# Patient Record
Sex: Male | Born: 1955 | Race: White | Hispanic: No | Smoking: Former smoker
Health system: Southern US, Community
[De-identification: ages and names within clinical notes are randomized; demographics above are authoritative.]

## PROBLEM LIST (undated history)

## (undated) DIAGNOSIS — K760 Fatty (change of) liver, not elsewhere classified: Secondary | ICD-10-CM

## (undated) DIAGNOSIS — M1990 Unspecified osteoarthritis, unspecified site: Secondary | ICD-10-CM

## (undated) DIAGNOSIS — Z860100 Personal history of colon polyps, unspecified: Secondary | ICD-10-CM

## (undated) DIAGNOSIS — I5189 Other ill-defined heart diseases: Secondary | ICD-10-CM

## (undated) DIAGNOSIS — G459 Transient cerebral ischemic attack, unspecified: Secondary | ICD-10-CM

## (undated) DIAGNOSIS — E7800 Pure hypercholesterolemia, unspecified: Secondary | ICD-10-CM

## (undated) DIAGNOSIS — I10 Essential (primary) hypertension: Secondary | ICD-10-CM

## (undated) DIAGNOSIS — F17210 Nicotine dependence, cigarettes, uncomplicated: Secondary | ICD-10-CM

## (undated) DIAGNOSIS — K624 Stenosis of anus and rectum: Secondary | ICD-10-CM

## (undated) DIAGNOSIS — I872 Venous insufficiency (chronic) (peripheral): Secondary | ICD-10-CM

## (undated) DIAGNOSIS — R4781 Slurred speech: Secondary | ICD-10-CM

## (undated) DIAGNOSIS — F419 Anxiety disorder, unspecified: Secondary | ICD-10-CM

## (undated) DIAGNOSIS — K5730 Diverticulosis of large intestine without perforation or abscess without bleeding: Secondary | ICD-10-CM

## (undated) DIAGNOSIS — E669 Obesity, unspecified: Secondary | ICD-10-CM

## (undated) HISTORY — PX: TONSILLECTOMY: SUR1361

## (undated) HISTORY — DX: Stenosis of anus and rectum: K62.4

## (undated) HISTORY — DX: Nicotine dependence, cigarettes, uncomplicated: F17.210

## (undated) HISTORY — DX: Venous insufficiency (chronic) (peripheral): I87.2

## (undated) HISTORY — DX: Obesity, unspecified: E66.9

## (undated) HISTORY — DX: Diverticulosis of large intestine without perforation or abscess without bleeding: K57.30

## (undated) HISTORY — DX: Pure hypercholesterolemia, unspecified: E78.00

## (undated) HISTORY — DX: Anxiety disorder, unspecified: F41.9

## (undated) HISTORY — PX: FLEXIBLE SIGMOIDOSCOPY: SHX1649

## (undated) HISTORY — DX: Unspecified osteoarthritis, unspecified site: M19.90

## (undated) HISTORY — DX: Personal history of colonic polyps: Z86.010

## (undated) HISTORY — DX: Fatty (change of) liver, not elsewhere classified: K76.0

## (undated) HISTORY — DX: Essential (primary) hypertension: I10

## (undated) HISTORY — DX: Personal history of colon polyps, unspecified: Z86.0100

---

## 2003-11-21 HISTORY — PX: KNEE ARTHROSCOPY: SHX127

## 2003-12-13 ENCOUNTER — Ambulatory Visit (HOSPITAL_COMMUNITY): Admission: RE | Admit: 2003-12-13 | Discharge: 2003-12-13 | Payer: Self-pay | Admitting: Orthopedic Surgery

## 2005-05-13 ENCOUNTER — Ambulatory Visit: Payer: Self-pay | Admitting: Pulmonary Disease

## 2006-06-16 ENCOUNTER — Ambulatory Visit: Payer: Self-pay | Admitting: Pulmonary Disease

## 2009-05-22 DIAGNOSIS — E7800 Pure hypercholesterolemia, unspecified: Secondary | ICD-10-CM | POA: Insufficient documentation

## 2009-05-23 ENCOUNTER — Ambulatory Visit: Payer: Self-pay | Admitting: Pulmonary Disease

## 2009-05-27 DIAGNOSIS — M1990 Unspecified osteoarthritis, unspecified site: Secondary | ICD-10-CM | POA: Insufficient documentation

## 2009-05-27 DIAGNOSIS — F17200 Nicotine dependence, unspecified, uncomplicated: Secondary | ICD-10-CM | POA: Insufficient documentation

## 2009-05-27 DIAGNOSIS — K649 Unspecified hemorrhoids: Secondary | ICD-10-CM | POA: Insufficient documentation

## 2009-05-27 DIAGNOSIS — K7689 Other specified diseases of liver: Secondary | ICD-10-CM | POA: Insufficient documentation

## 2009-05-27 DIAGNOSIS — K624 Stenosis of anus and rectum: Secondary | ICD-10-CM

## 2009-05-27 DIAGNOSIS — F411 Generalized anxiety disorder: Secondary | ICD-10-CM | POA: Insufficient documentation

## 2009-05-27 LAB — CONVERTED CEMR LAB
ALT: 20 units/L (ref 0–53)
AST: 21 units/L (ref 0–37)
Albumin: 4.7 g/dL (ref 3.5–5.2)
Alkaline Phosphatase: 78 units/L (ref 39–117)
BUN: 14 mg/dL (ref 6–23)
Basophils Absolute: 0.1 10*3/uL (ref 0.0–0.1)
Basophils Relative: 0.7 % (ref 0.0–3.0)
Bilirubin, Direct: 0.2 mg/dL (ref 0.0–0.3)
CO2: 29 meq/L (ref 19–32)
Calcium: 9.6 mg/dL (ref 8.4–10.5)
Chloride: 105 meq/L (ref 96–112)
Cholesterol: 213 mg/dL — ABNORMAL HIGH (ref 0–200)
Creatinine, Ser: 1 mg/dL (ref 0.4–1.5)
Direct LDL: 139.4 mg/dL
Eosinophils Absolute: 0.3 10*3/uL (ref 0.0–0.7)
Eosinophils Relative: 2.2 % (ref 0.0–5.0)
GFR calc non Af Amer: 83.66 mL/min (ref >60)
Glucose, Bld: 94 mg/dL (ref 70–99)
HCT: 47.4 % (ref 39.0–52.0)
HDL: 51 mg/dL (ref >39.00)
Hemoglobin, Urine: NEGATIVE
Hemoglobin: 16.2 g/dL (ref 13.0–17.0)
Leukocytes, UA: NEGATIVE
Lymphocytes Relative: 21.9 % (ref 12.0–46.0)
Lymphs Abs: 2.8 10*3/uL (ref 0.7–4.0)
MCHC: 34.2 g/dL (ref 30.0–36.0)
MCV: 93.2 fL (ref 78.0–100.0)
Monocytes Absolute: 1.2 10*3/uL — ABNORMAL HIGH (ref 0.1–1.0)
Monocytes Relative: 9 % (ref 3.0–12.0)
Neutro Abs: 8.4 10*3/uL — ABNORMAL HIGH (ref 1.4–7.7)
Neutrophils Relative %: 66.2 % (ref 43.0–77.0)
Nitrite: NEGATIVE
PSA: 0.21 ng/mL (ref 0.10–4.00)
Platelets: 246 10*3/uL (ref 150.0–400.0)
Potassium: 4.2 meq/L (ref 3.5–5.1)
RBC: 5.08 M/uL (ref 4.22–5.81)
RDW: 13 % (ref 11.5–14.6)
Sodium: 142 meq/L (ref 135–145)
Specific Gravity, Urine: 1.015 (ref 1.000–1.030)
TSH: 1.77 microintl units/mL (ref 0.35–5.50)
Total Bilirubin: 1.2 mg/dL (ref 0.3–1.2)
Total CHOL/HDL Ratio: 4
Total Protein: 8.2 g/dL (ref 6.0–8.3)
Triglycerides: 164 mg/dL — ABNORMAL HIGH (ref 0.0–149.0)
Urine Glucose: NEGATIVE mg/dL
Urobilinogen, UA: 0.2 (ref 0.0–1.0)
VLDL: 32.8 mg/dL (ref 0.0–40.0)
WBC: 12.8 10*3/uL — ABNORMAL HIGH (ref 4.5–10.5)
pH: 6 (ref 5.0–8.0)

## 2009-06-05 ENCOUNTER — Encounter: Payer: Self-pay | Admitting: Gastroenterology

## 2009-06-06 ENCOUNTER — Ambulatory Visit: Payer: Self-pay | Admitting: Gastroenterology

## 2009-06-27 ENCOUNTER — Ambulatory Visit: Payer: Self-pay | Admitting: Gastroenterology

## 2009-06-27 ENCOUNTER — Encounter: Payer: Self-pay | Admitting: Pulmonary Disease

## 2009-06-27 ENCOUNTER — Encounter: Payer: Self-pay | Admitting: Gastroenterology

## 2009-06-27 HISTORY — PX: COLONOSCOPY W/ POLYPECTOMY: SHX1380

## 2009-07-09 ENCOUNTER — Encounter: Payer: Self-pay | Admitting: Gastroenterology

## 2009-12-21 ENCOUNTER — Telehealth (INDEPENDENT_AMBULATORY_CARE_PROVIDER_SITE_OTHER): Payer: Self-pay | Admitting: *Deleted

## 2010-08-19 ENCOUNTER — Ambulatory Visit: Payer: Self-pay | Admitting: Pulmonary Disease

## 2010-08-19 ENCOUNTER — Encounter: Payer: Self-pay | Admitting: Pulmonary Disease

## 2010-08-24 DIAGNOSIS — K5730 Diverticulosis of large intestine without perforation or abscess without bleeding: Secondary | ICD-10-CM | POA: Insufficient documentation

## 2010-08-24 DIAGNOSIS — D126 Benign neoplasm of colon, unspecified: Secondary | ICD-10-CM

## 2010-08-24 DIAGNOSIS — L219 Seborrheic dermatitis, unspecified: Secondary | ICD-10-CM | POA: Insufficient documentation

## 2010-08-24 LAB — CONVERTED CEMR LAB
ALT: 20 units/L (ref 0–53)
AST: 18 units/L (ref 0–37)
Albumin: 4.4 g/dL (ref 3.5–5.2)
Alkaline Phosphatase: 68 units/L (ref 39–117)
BUN: 11 mg/dL (ref 6–23)
Basophils Absolute: 0 10*3/uL (ref 0.0–0.1)
Basophils Relative: 0.2 % (ref 0.0–3.0)
Bilirubin Urine: NEGATIVE
Bilirubin, Direct: 0.1 mg/dL (ref 0.0–0.3)
CO2: 29 meq/L (ref 19–32)
Calcium: 9.2 mg/dL (ref 8.4–10.5)
Chloride: 96 meq/L (ref 96–112)
Cholesterol: 206 mg/dL — ABNORMAL HIGH (ref 0–200)
Creatinine, Ser: 0.8 mg/dL (ref 0.4–1.5)
Direct LDL: 138.5 mg/dL
Eosinophils Absolute: 0.1 10*3/uL (ref 0.0–0.7)
Eosinophils Relative: 0.8 % (ref 0.0–5.0)
GFR calc non Af Amer: 109.28 mL/min (ref >60)
Glucose, Bld: 81 mg/dL (ref 70–99)
HCT: 42.8 % (ref 39.0–52.0)
HDL: 46.3 mg/dL (ref >39.00)
Hemoglobin, Urine: NEGATIVE
Hemoglobin: 14.8 g/dL (ref 13.0–17.0)
Ketones, ur: NEGATIVE mg/dL
Leukocytes, UA: NEGATIVE
Lymphocytes Relative: 23.5 % (ref 12.0–46.0)
Lymphs Abs: 2.5 10*3/uL (ref 0.7–4.0)
MCHC: 34.6 g/dL (ref 30.0–36.0)
MCV: 91.9 fL (ref 78.0–100.0)
Monocytes Absolute: 0.7 10*3/uL (ref 0.1–1.0)
Monocytes Relative: 7.1 % (ref 3.0–12.0)
Neutro Abs: 7.2 10*3/uL (ref 1.4–7.7)
Neutrophils Relative %: 68.4 % (ref 43.0–77.0)
Nitrite: NEGATIVE
PSA: 0.21 ng/mL (ref 0.10–4.00)
Platelets: 257 10*3/uL (ref 150.0–400.0)
Potassium: 4.7 meq/L (ref 3.5–5.1)
RBC: 4.66 M/uL (ref 4.22–5.81)
RDW: 13.9 % (ref 11.5–14.6)
Sodium: 140 meq/L (ref 135–145)
Specific Gravity, Urine: 1.025 (ref 1.000–1.030)
TSH: 1.94 microintl units/mL (ref 0.35–5.50)
Total Bilirubin: 1.1 mg/dL (ref 0.3–1.2)
Total CHOL/HDL Ratio: 4
Total Protein, Urine: NEGATIVE mg/dL
Total Protein: 7.2 g/dL (ref 6.0–8.3)
Triglycerides: 111 mg/dL (ref 0.0–149.0)
Urine Glucose: NEGATIVE mg/dL
Urobilinogen, UA: 0.2 (ref 0.0–1.0)
VLDL: 22.2 mg/dL (ref 0.0–40.0)
WBC: 10.6 10*3/uL — ABNORMAL HIGH (ref 4.5–10.5)
pH: 6 (ref 5.0–8.0)

## 2010-11-19 NOTE — Assessment & Plan Note (Signed)
Summary: CPX ///kp   CC:  Yearly ROV & CPX and age 55....  History of Present Illness: 55 y/o WM here for a follow up visit and CPX...    ~  May 23, 2009:  it's been 22yrs since I've seen Ryan [name]- states he's feeling well w/o complaints or concerns... unfortunately he continues to have several serious health risks including his continued smoking, HBP, Cholesterol, & Obesity... he notes that he has made great strides in that he's cut back his smoking and has lost 55# down to 299# at present... this should translate into lower BP and Chol values, but he has a long way to go & we discussed all this today...   ~  August 19, 2010:  states he's quit smoking "I only smoke at casinos now"... he's gained 37# back to 336# today... discussed smoking cessation strategies & offered Chantix... discussed need to get back on diet/ exercise/ & start statin Rx w/ LIPITOR 20mg /d... BP borderline w/ weight gain & he made need BP meds if he can't get the weight down... see prob list below:    Current Problems:   PHYSICAL EXAMINATION (ICD-V70.0)  CIGARETTE SMOKER (ICD-305.1) - he had decreased to 2 pks per week previously & states he quit smoking 9/11> "I only smoke in casinos now" & he is encouraged to quit completely & offered Chantix, counselling, etc...  HYPERTENSION (ICD-401.9) - prev on Metoprolol & Accupril but he stopped these on his own when he got serious w/ diet & exercise...   ~  8/10:  weight is down 55# to 299# in 2010 and BP= 130/90 which was better... he is reluctant to restart meds, so we decided on home BP monitoring w/ goal <140/85...  ~  10/11:  weight back up to 336# & BP= 142/92, but he doesn't want to restart meds stating that he knows he can improve BP by getting hias weight down w/ diet + exercise...  VENOUS INSUFFICIENCY (ICD-459.81) - he has chr ven inuffic & edema... he knows to elim sodium, elevate legs, wear support hose... prev on Lasix20  but off all meds during the last yr or  so...  HYPERCHOLESTEROLEMIA (ICD-272.0) - prev on Lipitor10, but he stopped this on his own...  ~  FLP 7/99 on diet showed TChol 256, TG 342, HDL 47, LDL 141... rec> start Lipitor10.  ~  FLP 3/04 on Lip10 showed TChol 156, TG 149, HDL 42, LDL 85  ~  FLP 8/07 on Lip10 showed TChol 158, TG 133, HDL 43, LDL 89  ~  FLP 8/10 on diet showed TChol 213, TG 164, HDL 51, LDL 139... he wants to continue diet Rx.  ~  FLP 10/11 on diet showed TChol 206, TG 111, HDL 46, LDL 139... rec> start LIPITOR 20mg /d.  OBESITY (ICD-278.00) - best weight per old chart = 277# in 1990, and worst weight = 355# in 2007... we have had numerous conversations about diet + exercise strategies for weight reduction...  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  ~  he had FlexSig 1991 by DrPatterson w/ int hems  and Rx w/ Anusol...  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 f/u planned 27yrs.  Hx of FATTY LIVER DISEASE (ICD-571.8) - hx elevated LFT's in the 90's believed related to hepatic steatosis w/ eval by DrPatterson in 03-14-1990... he drinks beer on weekends & advised to quit... LFT's have been normal in recent yrs.  DEGENERATIVE JOINT DISEASE (ICD-715.90) - s/p right knee arthroscopy 2/05 by DrGioffre for meniscus tear...  ANXIETY (ICD-300.00) - pt's wife died 03/14/2002 from breast cancer- 2 daughters & one has grad from [employer] nursing...  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   Preventive Screening-Counseling & Management  Alcohol-Tobacco     Alcohol drinks/day: 0     Smoking Status: quit     Year Quit: 2009-03-14  Comments: was smoking  about 2 packs per week--stated he quit drinking in 2009-03-14   Allergies (verified): No Known Drug Allergies  Comments:  Nurse/Medical Assistant: The patient's medications and allergies were reviewed with the patient and were updated in the Medication and Allergy Lists.  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 Father died age 72 in a MVA, hx CHF, DJD w/ THR. Mother alive age 68 in good health 4 Siblings: 1Brother & 3 Sisters in good health  Social History: Reviewed history from 05/23/2009 and no changes required. current smoker---2 packs per week exercises 6 times a week caffeine use   2 cups per day alcohol use---6-10 beers per week widowed 2 children Smoking Status:  quit  Review of Systems       The patient complains of rash.  The patient denies fever, chills, sweats, anorexia, fatigue, weakness, malaise, weight loss, sleep disorder, blurring, diplopia, eye irritation, eye discharge, vision loss, eye pain, photophobia, earache, ear discharge, tinnitus, decreased hearing, nasal congestion, nosebleeds, sore throat, hoarseness, chest pain, palpitations, syncope, dyspnea on exertion, orthopnea, PND, peripheral edema, cough, dyspnea at rest, excessive sputum, hemoptysis, wheezing, pleurisy, nausea, vomiting, diarrhea, constipation, change in bowel habits, abdominal pain, melena, hematochezia, jaundice, gas/bloating,  indigestion/heartburn, dysphagia, odynophagia, dysuria, hematuria, urinary frequency, urinary hesitancy, nocturia, incontinence, back pain, joint pain, joint swelling, muscle cramps, muscle weakness, stiffness, arthritis, sciatica, restless legs, leg pain at night, leg pain with exertion, itching, dryness, suspicious lesions, paralysis, paresthesias, seizures, tremors, vertigo, transient blindness, frequent falls, frequent headaches, difficulty walking, depression, anxiety, memory loss, confusion, cold intolerance, heat intolerance, polydipsia, polyphagia, polyuria, unusual weight change, abnormal bruising, bleeding, enlarged lymph nodes, urticaria, allergic rash, hay fever, and recurrent infections.    Vital Signs:  Patient profile:   56 year old male Height:      70 inches Weight:      336 pounds BMI:     48.39 O2 Sat:      99 % on Room air Temp:     97.6 degrees F oral Pulse rate:   69 / minute BP sitting:   142 / 92  (right arm) Cuff size:   large  Vitals Entered By: Randell Loop CMA (August 19, 2010 9:27 AM)  O2 Sat at Rest %:  99 O2 Flow:  Room air CC: Yearly ROV & CPX, age 55... Is Patient Diabetic? No Pain Assessment Patient in pain? no      Comments no changes in meds today   Physical Exam  Additional Exam:  WD, Obese, 55 y/o WM in NAD... GENERAL:  Alert & oriented; pleasant & cooperative... HEENT:  [place]/AT, EOM-wnl, PERRLA, Fundi-benign, EACs-clear, TMs-wnl, NOSE-clear, THROAT-clear & wnl. NECK:  Supple w/ full ROM; no JVD; normal carotid impulses w/o bruits; no thyromegaly or nodules palpated; no lymphadenopathy. CHEST:  Clear to P & A; without wheezes/ rales/ or rhonchi. HEART:  Regular Rhythm; without murmurs/ rubs/ or gallops. ABDOMEN:  Obese, soft & nontender; normal bowel sounds; no organomegaly or masses detected. RECTAL:  +rectal stricture, tender, can't reach prostate, stool heme neg... EXT: without deformities or arthritic changes; no varicose veins/ venous  insuffic/ or edema. NEURO:  CN's intact; motor testing normal; sensory testing normal; gait normal & balance OK. DERM:  No lesions noted; no rash etc...    CXR  Procedure date:  08/19/2010  Findings:      CHEST - 2 VIEW 08/19/2010: Comparison: Two-view chest x-ray 05/23/2009 and 06/16/2006.   Findings: Cardiac silhouette normal in size, unchanged.  Thoracic aorta minimally tortuous, unchanged.  Hilar and mediastinal contours otherwise unremarkable.  Stable linear scarring in the lingula and left lower lobe.  Lungs otherwise clear.  No pleural effusions.  Mild degenerative changes involving the thoracic spine. No significant interval change.   IMPRESSION: No acute cardiopulmonary disease.  Stable scarring in the lingula and left lower lobe.   Read By:  Arnell Sieving,  M.D.   EKG  Procedure date:  08/19/2010  Findings:      Normal sinus rhythm with rate of:  62/ min... Tracing is WNL, NAD... SN   MISC. Report  Procedure date:  08/19/2010  Findings:      BMP (METABOL)   Sodium                    140 mEq/L                   135-145   Potassium                 4.7 mEq/L                   3.5-5.1   Chloride                  96 mEq/L                    96-112   Carbon Dioxide            29 mEq/L                    19-32   Glucose                   81 mg/dL                    16-10   BUN                       11 mg/dL                    9-60   Creatinine                0.8 mg/dL  0.4-1.5   Calcium                   9.2 mg/dL                   1.6-10.9   GFR                       109.28 mL/min               >60   Hepatic/Liver Function Panel (HEPATIC)   Total Bilirubin           1.1 mg/dL                   6.0-4.5   Direct Bilirubin          0.1 mg/dL                   4.0-9.8   Alkaline Phosphatase      68 U/L                      39-117   AST                       18 U/L                      0-37   ALT                       20 U/L                       0-53   Total Protein             7.2 g/dL                    1.1-9.1   Albumin                   4.4 g/dL                    4.7-8.2  CBC Platelet w/Diff (CBCD)   White Cell Count     [H]  10.6 K/uL                   4.5-10.5   Red Cell Count            4.66 Mil/uL                 4.22-5.81   Hemoglobin                14.8 g/dL                   95.6-21.3   Hematocrit                42.8 %                      39.0-52.0   MCV                       91.9 fl                     78.0-100.0   Platelet Count            257.0 K/uL  150.0-400.0   Neutrophil %              68.4 %                      43.0-77.0   Lymphocyte %              23.5 %                      12.0-46.0   Monocyte %                7.1 %                       3.0-12.0   Eosinophils%              0.8 %                       0.0-5.0   Basophils %               0.2 %                       0.0-3.0  Comments:      Lipid Panel (LIPID)   Cholesterol          [H]  206 mg/dL                   0-272   Triglycerides             111.0 mg/dL                 5.3-664.4   HDL                       03.47 mg/dL                 >42.59 Cholesterol LDL - Direct                             138.5 mg/dL   TSH (TSH)   FastTSH                   1.94 uIU/mL                 0.35-5.50  Prostate Specific Antigen (PSA)   PSA-Hyb                   0.21 ng/mL                  0.10-4.00  UDip w/Micro (URINE)   Clarity                   CLEAR                       Clear   Specific Gravity          1.025                       1.000 - 1.030   Urine Ph                  6.0                         5.0-8.0   Protein  NEGATIVE                    Negative   Urine Glucose             NEGATIVE                    Negative   Ketones                   NEGATIVE                    Negative   Urine Bilirubin           NEGATIVE                    Negative   Blood                     NEGATIVE                    Negative    Urobilinogen              0.2                         0.0 - 1.0   Leukocyte Esterace        NEGATIVE                    Negative   Nitrite                   NEGATIVE                    Negative   Urine WBC                 0-2/hpf                     0-2/hpf   Urine Epith               Rare(0-4/hpf)               Rare(0-4/hpf)   Impression & Recommendations:  Problem # 1:  PHYSICAL EXAMINATION (ICD-V70.0)  Orders: EKG w/ Interpretation (93000) T-2 View CXR (71020TC) TLB-BMP (Basic Metabolic Panel-BMET) (80048-METABOL) TLB-Hepatic/Liver Function Pnl (80076-HEPATIC) TLB-CBC Platelet - w/Differential (85025-CBCD) TLB-Lipid Panel (80061-LIPID) TLB-TSH (Thyroid Stimulating Hormone) (84443-TSH) TLB-PSA (Prostate Specific Antigen) (84153-PSA) TLB-Udip w/ Micro (81001-URINE)  Problem # 2:  CIGARETTE SMOKER (ICD-305.1) Pt states that he's quit "except in casinos"...  Problem # 3:  HYPERTENSION (ICD-401.9) Doesn't want to restarty meds... therefore MUST get his weight down... discussed in detail...  Problem # 4:  VENOUS INSUFFICIENCY (ICD-459.81) Reminded:  no salt, elevation, support hose...  Problem # 5:  HYPERCHOLESTEROLEMIA (ICD-272.0) We discussed restarting Lipitor 20mg /d now... His updated medication list for this problem includes:    Lipitor 20 Mg Tabs (Atorvastatin calcium) .Marland Kitchen... Take 1 tab by mouth once daily  Problem # 6:  OBESITY (ICD-278.00) Diet + exercise are the keys to success...  Problem # 7:  COLONIC POLYPS (ICD-211.3) Mult lower GI/ colon problems as listed>  f/u colonoscopy due 8/13...  Problem # 8:  OTHER MEDICAL ISSUES AS NOTED>>> OK Flu vaccine today...  Complete Medication List: 1)  Lipitor 20 Mg Tabs (Atorvastatin calcium) .... Take 1 tab by mouth once daily 2)  Prilosec Otc 20 Mg Tbec (Omeprazole magnesium) .... Take 1 tab  by mouth once daily- 30 min before a meal... 3)  Cialis 20 Mg Tabs (Tadalafil) .... Take as directed  Other Orders: Admin 1st  Vaccine (11914) Flu Vaccine 55yrs + (78295)  Patient Instructions: 1)  Today we updated your med list- see below.... 2)  We decided to start back on a Statin med for your cholesterol while you work on diet, exercise, weight reduction... start the LIPITOR one tab daily. 3)  For the reflux symtpms> use PRILOSEC OTC one tab daily to suppress the acid.Marland KitchenMarland Kitchen 4)  Today we did your follow up CXR & FASTINg blood work... please call the "phone tree" in a few days for your lab results.Marland KitchenMarland Kitchen 5)  We also gave you the 2011 Flu vaccine... 6)  Let's get on track w/ our diet & exercise program> you did it before- get that weight down! 7)  Call for any questions... 8)  Please schedule a follow-up appointment in 1 year. Prescriptions: LIPITOR 20 MG TABS (ATORVASTATIN CALCIUM) take 1 tab by mouth once daily  #30 x 12   Entered and Authorized by:   Michele Mcalpine MD   Signed by:   Michele Mcalpine MD on 08/19/2010   Method used:   Print then Give to Patient   RxID:   [id number] CIALIS 20 MG TABS (TADALAFIL) take as directed  #10 x prn   Entered and Authorized by:   Michele Mcalpine MD   Signed by:   Michele Mcalpine MD on 08/19/2010   Method used:   Print then Give to Patient   RxID:   [id number]     Flu Vaccine Consent Questions     Do you have a history of severe allergic reactions to this vaccine? no    Any prior history of allergic reactions to egg and/or gelatin? no    Do you have a sensitivity to the preservative Thimersol? no    Do you have a past history of Guillan-Barre Syndrome? no    Do you currently have an acute febrile illness? no    Have you ever had a severe reaction to latex? no    Vaccine information given and explained to patient? yes    Are you currently pregnant? no    Lot Number:[id number]   Exp Date:04/19/2011   Site Given  Left Deltoid IMflu  Abigail Miyamoto RN  August 19, 2010 10:29 AM

## 2010-11-19 NOTE — Progress Notes (Signed)
Summary: REFILL----requested chart   Phone Note Call from Patient Call back at [phone]   Caller: Patient Call For: NADEL Summary of Call: NEED REFILL FOR [facility] Initial call taken by: Rickard Patience,  December 21, 2009 1:50 PM  Follow-up for Phone Call        pt was last seen by SN for cpx 05-2009.  pt has pending yearly cpx  05-2010.  pt requesting rx for Cialis.  Per EMR,  Cialis not listed on pt's med list.  Please advise. Aundra Millet Reynolds LPN  December 22, 4538 3:44 PM   Additional Follow-up for Phone Call Additional follow up Details #1::        request chart to see if previously rx  if so can have  just let me know  no meds on [facility] Additional Follow-up by: Rubye Oaks NP,  December 21, 2009 4:11 PM    Additional Follow-up for Phone Call Additional follow up Details #2::    requested paper chart.  Aundra Millet Reynolds LPN  December 21, 9809 4:34 PM   cialis listed in paper chart 20mg  --per tp ok to give enough until ov in august Follow-up by: Philipp Deputy CMA,  December 21, 2009 5:00 PM  New/Updated Medications: CIALIS 20 MG TABS (TADALAFIL) take as directed Prescriptions: CIALIS 20 MG TABS (TADALAFIL) take as directed  #90 x 1   Entered by:   Philipp Deputy CMA   Authorized by:   Michele Mcalpine MD   Signed by:   Philipp Deputy CMA on 12/21/2009   Method used:   Electronically to        [facility]* (mail-order)             ,          Ph: [phone]       Fax: [phone]   RxID:   [id number]

## 2011-03-06 ENCOUNTER — Other Ambulatory Visit: Payer: Self-pay | Admitting: *Deleted

## 2011-03-06 MED ORDER — TADALAFIL 20 MG PO TABS: 20.0000 mg | ORAL_TABLET | Freq: Every day | ORAL | Status: DC | PRN

## 2011-03-06 NOTE — Telephone Encounter (Signed)
Okay per Leigh to send for # 90 and no additional refills. Pt is scheduled for follow-up in October 2012 w/ SN.

## 2011-03-07 NOTE — Op Note (Signed)
NAME:  Ryan [name], Ryan [name]                          ACCOUNT NO.:  [id number]   MEDICAL RECORD NO.:  [id number]                   PATIENT TYPE:  AMB   LOCATION:  DAY                                  FACILITY:  [facility]   PHYSICIAN:  Georges Lynch. Darrelyn Hillock, M.D.             DATE OF BIRTH:  19-Dec-1955   DATE OF PROCEDURE:  12/13/2003  DATE OF DISCHARGE:                                 OPERATIVE REPORT   PREOPERATIVE DIAGNOSES:  1. Complex tear of the posterior horn of the medial meniscus, right knee.  2. Degenerative arthritis, right knee.   POSTOPERATIVE DIAGNOSIS:  1. Complex tear of the posterior horn of the medial meniscus, right knee.  2. Degenerative arthritis, right knee.   OPERATION:  1. Diagnostic arthroscopy, right knee.  2. Medial meniscectomy, right knee.  3. Abrasion chondroplasty of the patella, right knee.  4. Abrasion chondroplasty of the medial femoral condyle, right knee.   SURGEON:  Georges Lynch. Darrelyn Hillock, M.D.   ASSISTANT:  Nurse.   DESCRIPTION OF PROCEDURE:  Under local anesthesia with standby, routine  orthopedic prep and draping of the right lower extremity was carried out.  The patient had 1 g of IV Ancef.  A small punctate incision was made in the  suprapatellar pouch, inflow cannula was inserted, and the knee was distended  with saline.  Another small punctate incision was made in the anterolateral  joint.  The arthroscope was entered, a complete diagnostic arthroscopy was  carried out.  At this time it was noted that he had a complex tear as the  MRI showed of the posterior horn of the medial meniscus.  I introduced the  shaver suction device, did a partial medial meniscectomy.  Following that I  then went on and identified the patella.  He had a significant defect in the  patella.  I introduced the shaver suction device and did an abrasion  chondroplasty of the patella.  I went down into the lateral joint space.  The lateral joint space was clean.  There was no  abnormality.  The cruciates  were intact.  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  I introduced the shaver  suction device and did an abrasion chondroplasty.  I also did a synovectomy.  I thoroughly irrigated out the knee, removed all the loose pieces of  material.  I then removed the fluid and closed all three punctate incisions  with 3-0 nylon suture.  Sterile Neosporin dressing was applied after I  injected 20 mL of 0.5% Marcaine and epinephrine into the knee joint.  The  patient left the operating room in satisfactory condition.  He had 1 g of IV  Ancef preop.   FOLLOW-UP CARE:  He will be on Mepergan Fortis one or two every four hours  p.r.n. for pain.  He will be on Bufferin one twice a  day today and for two  weeks and will be on crutches for weightbearing.  I will see him back in two  weeks or prior to if there is any problem.                                               Ronald A. Darrelyn Hillock, M.D.    RAG/MEDQ  D:  12/13/2003  T:  12/13/2003  Job:  [id number]

## 2011-08-19 ENCOUNTER — Ambulatory Visit: Payer: Self-pay | Admitting: Pulmonary Disease

## 2011-10-01 ENCOUNTER — Telehealth: Payer: Self-pay | Admitting: *Deleted

## 2011-10-01 NOTE — Telephone Encounter (Signed)
Called and lmomtcb for pt to call back to reschedule appt.

## 2011-10-03 ENCOUNTER — Ambulatory Visit: Payer: Self-pay | Admitting: Pulmonary Disease

## 2011-10-03 NOTE — Telephone Encounter (Signed)
Going to sign off of this message.  Have still not heard back from pt and will wait to see if he reschedules.

## 2011-12-26 ENCOUNTER — Other Ambulatory Visit: Payer: Self-pay | Admitting: Pulmonary Disease

## 2012-02-12 ENCOUNTER — Encounter: Payer: Self-pay | Admitting: Pulmonary Disease

## 2012-02-12 ENCOUNTER — Ambulatory Visit (INDEPENDENT_AMBULATORY_CARE_PROVIDER_SITE_OTHER)
Admission: RE | Admit: 2012-02-12 | Discharge: 2012-02-12 | Disposition: A | Discharge status: Home / Self Care | Payer: 59 | Source: Ambulatory Visit | Attending: Pulmonary Disease | Admitting: Pulmonary Disease

## 2012-02-12 ENCOUNTER — Ambulatory Visit (INDEPENDENT_AMBULATORY_CARE_PROVIDER_SITE_OTHER): Payer: 59 | Admitting: Pulmonary Disease

## 2012-02-12 VITALS — BP 152/84 | HR 84 | Temp 97.1°F | Ht 70.0 in | Wt 354.6 lb

## 2012-02-12 DIAGNOSIS — E7800 Pure hypercholesterolemia, unspecified: Secondary | ICD-10-CM

## 2012-02-12 DIAGNOSIS — E669 Obesity, unspecified: Secondary | ICD-10-CM

## 2012-02-12 DIAGNOSIS — K624 Stenosis of anus and rectum: Secondary | ICD-10-CM

## 2012-02-12 DIAGNOSIS — K5730 Diverticulosis of large intestine without perforation or abscess without bleeding: Secondary | ICD-10-CM

## 2012-02-12 DIAGNOSIS — Z0000 Encounter for general adult medical examination without abnormal findings: Secondary | ICD-10-CM

## 2012-02-12 DIAGNOSIS — I10 Essential (primary) hypertension: Secondary | ICD-10-CM

## 2012-02-12 DIAGNOSIS — D126 Benign neoplasm of colon, unspecified: Secondary | ICD-10-CM

## 2012-02-12 DIAGNOSIS — I872 Venous insufficiency (chronic) (peripheral): Secondary | ICD-10-CM

## 2012-02-12 DIAGNOSIS — M1990 Unspecified osteoarthritis, unspecified site: Secondary | ICD-10-CM

## 2012-02-12 MED ORDER — ATORVASTATIN CALCIUM 20 MG PO TABS: 20.0000 mg | ORAL_TABLET | Freq: Every day | ORAL | Status: DC

## 2012-02-12 MED ORDER — TADALAFIL 20 MG PO TABS: 20.0000 mg | ORAL_TABLET | Freq: Every day | ORAL | Status: DC | PRN

## 2012-02-12 MED ORDER — MELOXICAM 15 MG PO TABS: 15.0000 mg | ORAL_TABLET | Freq: Every day | ORAL | Status: DC | PRN

## 2012-02-12 NOTE — Patient Instructions (Signed)
Today we updated your med list in our EPIC system...    Continue your current medications the same...    We refilled your meds per request...  Today we did your f/u CXR & EKG... Please return to our lab one morning soon for your FASTING blood work...    We will call you w/ the results when avail...  Monitor your BP at home & keep a log of your readings...  Let's get on track w/ our diet & exercise program...    The goal is to lose that first 15-20 lbs!!!  Call for any questions...  Let's plan a brief recheck in 6 months or so to see how your BP & weight are doing!

## 2012-02-16 ENCOUNTER — Other Ambulatory Visit (INDEPENDENT_AMBULATORY_CARE_PROVIDER_SITE_OTHER): Payer: 59

## 2012-02-16 ENCOUNTER — Encounter: Payer: Self-pay | Admitting: Pulmonary Disease

## 2012-02-16 DIAGNOSIS — Z0000 Encounter for general adult medical examination without abnormal findings: Secondary | ICD-10-CM

## 2012-02-16 LAB — TSH: TSH: 1.9 u[IU]/mL (ref 0.35–5.50)

## 2012-02-16 LAB — URINALYSIS
Hgb urine dipstick: NEGATIVE
Leukocytes, UA: NEGATIVE
Nitrite: NEGATIVE
Urine Glucose: NEGATIVE
Urobilinogen, UA: 0.2 (ref 0.0–1.0)

## 2012-02-16 LAB — BASIC METABOLIC PANEL
BUN: 12 mg/dL (ref 6–23)
CO2: 27 mEq/L (ref 19–32)
Chloride: 104 mEq/L (ref 96–112)
Creatinine, Ser: 0.7 mg/dL (ref 0.4–1.5)
Glucose, Bld: 88 mg/dL (ref 70–99)

## 2012-02-16 LAB — CBC WITH DIFFERENTIAL/PLATELET
Basophils Absolute: 0 10*3/uL (ref 0.0–0.1)
Basophils Relative: 0.3 % (ref 0.0–3.0)
Eosinophils Absolute: 0.1 10*3/uL (ref 0.0–0.7)
Lymphocytes Relative: 24.3 % (ref 12.0–46.0)
MCHC: 33.2 g/dL (ref 30.0–36.0)
Monocytes Relative: 7.6 % (ref 3.0–12.0)
Neutrophils Relative %: 66.6 % (ref 43.0–77.0)
RBC: 4.72 Mil/uL (ref 4.22–5.81)

## 2012-02-16 LAB — PSA: PSA: 0.15 ng/mL (ref 0.10–4.00)

## 2012-02-16 LAB — HEPATIC FUNCTION PANEL
ALT: 21 U/L (ref 0–53)
Albumin: 4.1 g/dL (ref 3.5–5.2)
Bilirubin, Direct: 0.1 mg/dL (ref 0.0–0.3)
Total Protein: 6.8 g/dL (ref 6.0–8.3)

## 2012-02-16 LAB — LIPID PANEL
Cholesterol: 164 mg/dL (ref 0–200)
HDL: 51.2 mg/dL (ref >39.00)
Total CHOL/HDL Ratio: 3
Triglycerides: 127 mg/dL (ref 0.0–149.0)

## 2012-02-16 NOTE — Progress Notes (Addendum)
Subjective:     Patient ID: Ryan [name], male   DOB: 01/22/1956, 56 y.o.   MRN: [id number]  HPI 57 y/o WM here for a follow up visit and CPX...   ~  May 23, 2009:  it's been 18yrs since I've seen Omair- states he's feeling well w/o complaints or concerns... unfortunately he continues to have several serious health risks including his continued smoking, HBP, Cholesterol, & Obesity... he notes that he has made great strides in that he's cut back his smoking and has lost 55# down to 299# at present... this should translate into lower BP and Chol values, but he has a long way to go & we discussed all this today...  ~  August 19, 2010:  states he's quit smoking "I only smoke at casinos now"... he's gained 37# back to 336# today... discussed smoking cessation strategies & offered Chantix... discussed need to get back on diet/ exercise/ & start statin Rx w/ LIPITOR 20mg /d... BP borderline w/ weight gain & he made need BP meds if he can't get the weight down... see prob list below:  ~  February 14, 2012:  83mo ROV & CPX> Micky's CC is his knees, DJD & he is seeing an Orthopedist on [facility] 15mg /d as needed;  Unfortunately his weight is up a further 19# to 355# now in his 5'10" frame for a BMI=51; we discussed bariatric options but he is not interested, states he will "start" diet & continue swimming at the [place] [facility] 5d per week...  We reviewed prob list, meds, xrays and labs> see below>> CXR 4/13 showed norm heart size, clear lungs, NAD... LABS 4/13:  FLP- at goals on Lip20;  Chems- wnl;  CBC- wnl;  TSH=1.90;  PSA=0.15;  UA=clear...   Problem List:   PHYSICAL EXAMINATION (ICD-V70.0)  CIGARETTE SMOKER (ICD-305.1) - he had decreased to 2 pks per week previously & states he quit smoking 9/11> "I only smoke in casinos now" & he is encouraged to quit completely & offered Chantix, counselling, etc...  HYPERTENSION (ICD-401.9) - prev on Metoprolol & Accupril but he stopped these on his own when he got serious  w/ diet & exercise...  ~  8/10:  weight is down 55# to 299# in 2010 and BP= 130/90 which was better... he is reluctant to restart meds, so we decided on home BP monitoring w/ goal <140/85... ~  10/11:  weight back up to 336# & BP= 142/92, but he doesn't want to restart meds stating that he knows he can improve BP by getting hias weight down w/ diet + exercise... ~  4/13:  Weight up further to 355# & BP= 152/84; denies CP, palpit, SOB, edema; desperately needs to lose the weight or start back on BP meds for cardioprotection.  VENOUS INSUFFICIENCY (ICD-459.81) - he has chr ven inuffic & edema... he knows to elim sodium, elevate legs, wear support hose... prev on Lasix20  but off all meds during the last yr or so...  HYPERCHOLESTEROLEMIA (ICD-272.0) - prev on Lipitor10, but he stopped this on his own... ~  FLP 7/99 on diet showed TChol 256, TG 342, HDL 47, LDL 141... rec> start Lipitor10. ~  FLP 3/04 on Lip10 showed TChol 156, TG 149, HDL 42, LDL 85 ~  FLP 8/07 on Lip10 showed TChol 158, TG 133, HDL 43, LDL 89 ~  FLP 8/10 on diet showed TChol 213, TG 164, HDL 51, LDL 139... he wants to continue diet Rx. ~  FLP 10/11 on diet  showed TChol 206, TG 111, HDL 46, LDL 139... rec> start LIPITOR 20mg /d. ~  FLP on Lip20 showed TChol 164, TG 127, HDL 51, LDL 87  OBESITY (ICD-278.00) - best weight per old chart = 277# in Mar 28, 1989, and worst weight = 355# in 28-Mar-2006... we have had numerous conversations about diet + exercise strategies for weight reduction... 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 ~  he had FlexSig 1991 by DrPatterson w/ int hems and Rx w/ Anusol... 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 f/u planned 73yrs.  Hx of FATTY LIVER DISEASE (ICD-571.8) - hx elevated LFT's in the 90's believed related to hepatic steatosis w/ eval by DrPatterson in 03/28/90... he drinks beer on weekends & advised to quit... LFT's have been normal in recent yrs. ~  4/13:  LFTs remain WNL despite weight gain to 355#  DEGENERATIVE JOINT DISEASE (ICD-715.90) - s/p right knee arthroscopy 2/05 by DrGioffre for meniscus tear...  ANXIETY (ICD-300.00) - pt's wife died 2002/03/28 from breast cancer- 2 daughters & one has grad from [employer] nursing...  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   Past Surgical History  Procedure Date  . Right knee arthroscopy 11/2003    Dr. Darrelyn Hillock    Outpatient Encounter Prescriptions as of 02/12/2012  Medication Sig Dispense Refill  . atorvastatin (LIPITOR) 20 MG tablet Take 1 tablet (20 mg total) by mouth daily.  90 tablet  3  . meloxicam (MOBIC) 15 MG tablet Take 1 tablet (15 mg total) by mouth daily as needed.  90 tablet  3  . omeprazole (PRILOSEC OTC) 20 MG tablet Take 20 mg by mouth daily. 30 minutes before a meal      . tadalafil (CIALIS) 20 MG tablet Take 20 mg by mouth daily as needed.      Marland Kitchen DISCONTD: atorvastatin (LIPITOR) 20 MG tablet Take 20 mg by mouth daily.      Marland Kitchen DISCONTD: meloxicam (MOBIC) 15 MG tablet Take 15 mg by mouth daily as needed.      . tadalafil (CIALIS) 20 MG tablet Take 1 tablet (20 mg total) by mouth daily as needed for erectile dysfunction.  90 tablet  3  . DISCONTD: tadalafil (CIALIS) 20 MG tablet Take 1 tablet (20 mg total) by mouth daily as needed for erectile dysfunction.  90 tablet  0    No Known Allergies   Current Medications, Allergies, Past Medical History, Past Surgical History, Family History, and Social History were reviewed in [employer] record.   Review of Systems        The patient complains of rash.  The  patient denies fever, chills, sweats, anorexia, fatigue, weakness, malaise, weight loss, sleep disorder, blurring, diplopia, eye irritation, eye discharge, vision loss, eye pain, photophobia, earache, ear discharge, tinnitus, decreased hearing, nasal congestion, nosebleeds, sore throat, hoarseness, chest pain, palpitations, syncope, dyspnea on exertion, orthopnea, PND, peripheral edema, cough, dyspnea at rest, excessive sputum, hemoptysis, wheezing, pleurisy, nausea, vomiting, diarrhea, constipation, change in bowel habits, abdominal pain, melena, hematochezia, jaundice, gas/bloating,  indigestion/heartburn, dysphagia, odynophagia, dysuria, hematuria, urinary frequency, urinary hesitancy, nocturia, incontinence, back pain, joint pain, joint swelling, muscle cramps, muscle weakness, stiffness, arthritis, sciatica, restless legs, leg pain at night, leg pain with exertion, itching, dryness, suspicious lesions, paralysis, paresthesias, seizures, tremors, vertigo, transient blindness, frequent falls, frequent headaches, difficulty walking, depression, anxiety, memory loss, confusion, cold intolerance, heat intolerance, polydipsia, polyphagia, polyuria, unusual weight change, abnormal bruising, bleeding, enlarged lymph nodes, urticaria, allergic rash, hay fever, and recurrent infections.     Objective:   Physical Exam     WD, Obese, 56 y/o WM in NAD... GENERAL:  Alert & oriented; pleasant & cooperative... HEENT:  [place]/AT, EOM-wnl, PERRLA, Fundi-benign, EACs-clear, TMs-wnl, NOSE-clear, THROAT-clear & wnl. NECK:  Supple w/ full ROM; no JVD; normal carotid impulses w/o bruits; no thyromegaly or nodules palpated; no lymphadenopathy. CHEST:  Clear to P & A; without wheezes/ rales/ or rhonchi. HEART:  Regular Rhythm; without murmurs/ rubs/ or gallops. ABDOMEN:  Obese, soft & nontender; normal bowel sounds; no organomegaly or masses detected. RECTAL:  +rectal stricture, tender, can't reach prostate, stool heme  neg... EXT: without deformities or arthritic changes; no varicose veins/ venous insuffic/ or edema. NEURO:  CN's intact; motor testing normal; sensory testing normal; gait normal & balance OK. DERM:  No lesions noted; no rash etc...  RADIOLOGY DATA:  Reviewed in the EPIC EMR & discussed w/ the patient...  LABORATORY DATA:  Reviewed in the EPIC EMR & discussed w/ the patient...   Assessment:     CPX>>  We reviewed diet, exercise & weight reduction strategies...  HBP>  Borderline BP on "diet" alone; reviewed need for no salt & weight reduction...  Ven Insuffic>  He knows to elim sodium, elev legs, wear support hose, take Lasix 20mg  prn...  CHOL>  On Lip20 but needs much better diet & weight reduction...  Obesity>  As above- weight 355# & BMI= 51; needs substantial weight reduction & I have rec bariatric surg for him (Call [facility])...  GI> Divertics, Polyps, Hems, rectal stricture>  He uses OTC Prilosec prn; he has rectal stricture & needs to maintain soft stool due to narrow caliber; neg for blood but due for colonoscopy later this yr per DrPatterson...  Fatty Liver Dis>  LFTs remain normal despite weight gain...  DJD>  Aware- followed by Ortho on Mobic & we discussed "wear & tear" joints, his weight, etc...     Plan:     Patient's Medications  New Prescriptions   No medications on file  Previous Medications   OMEPRAZOLE (PRILOSEC OTC) 20 MG TABLET    Take 20 mg by mouth daily. 30 minutes before a meal   TADALAFIL (CIALIS) 20 MG TABLET    Take 20 mg by mouth daily as needed.  Modified Medications   Modified Medication Previous Medication   ATORVASTATIN (LIPITOR) 20 MG TABLET atorvastatin (LIPITOR) 20 MG tablet      Take 1 tablet (20 mg total) by mouth daily.    Take 20 mg by mouth daily.   MELOXICAM (MOBIC) 15 MG TABLET meloxicam (MOBIC) 15 MG tablet      Take 1 tablet (15 mg total) by mouth daily as needed.    Take 15 mg by mouth daily as needed.   TADALAFIL (CIALIS) 20 MG  TABLET tadalafil (CIALIS) 20 MG tablet      Take 1 tablet (20 mg total) by mouth daily as needed for erectile dysfunction.    Take 1 tablet (20 mg total) by mouth daily as needed  for erectile dysfunction.  Discontinued Medications   No medications on file

## 2012-02-20 ENCOUNTER — Telehealth: Payer: Self-pay | Admitting: Pulmonary Disease

## 2012-02-20 NOTE — Telephone Encounter (Signed)
Called spoke with patient, advised of 4.29.13 lab results as stated by SN:  Result Note     Pt notified by Leigh... SMN FLP looks good on Lip20> continue same med, get on diet, get wt down!! Chems/ CBC/ Thyroid/ PSA/ Urine> All WNL, clear, normal...   Pt verbalized his understanding and denied any questions.  Will sign off.

## 2012-05-24 HISTORY — PX: KNEE ARTHROSCOPY: SHX127

## 2012-08-02 ENCOUNTER — Encounter: Payer: Self-pay | Admitting: Gastroenterology

## 2012-08-13 ENCOUNTER — Ambulatory Visit: Payer: 59 | Admitting: Pulmonary Disease

## 2012-08-16 ENCOUNTER — Encounter: Payer: Self-pay | Admitting: *Deleted

## 2012-08-17 ENCOUNTER — Ambulatory Visit (INDEPENDENT_AMBULATORY_CARE_PROVIDER_SITE_OTHER): Payer: 59 | Admitting: Pulmonary Disease

## 2012-08-17 ENCOUNTER — Encounter: Payer: Self-pay | Admitting: Pulmonary Disease

## 2012-08-17 VITALS — BP 138/82 | HR 89 | Temp 97.2°F | Ht 70.0 in | Wt 358.2 lb

## 2012-08-17 DIAGNOSIS — I10 Essential (primary) hypertension: Secondary | ICD-10-CM

## 2012-08-17 DIAGNOSIS — I872 Venous insufficiency (chronic) (peripheral): Secondary | ICD-10-CM

## 2012-08-17 DIAGNOSIS — D126 Benign neoplasm of colon, unspecified: Secondary | ICD-10-CM

## 2012-08-17 DIAGNOSIS — M1990 Unspecified osteoarthritis, unspecified site: Secondary | ICD-10-CM

## 2012-08-17 DIAGNOSIS — E669 Obesity, unspecified: Secondary | ICD-10-CM

## 2012-08-17 DIAGNOSIS — Z23 Encounter for immunization: Secondary | ICD-10-CM

## 2012-08-17 DIAGNOSIS — E7800 Pure hypercholesterolemia, unspecified: Secondary | ICD-10-CM

## 2012-08-17 DIAGNOSIS — K5730 Diverticulosis of large intestine without perforation or abscess without bleeding: Secondary | ICD-10-CM

## 2012-08-17 DIAGNOSIS — K7689 Other specified diseases of liver: Secondary | ICD-10-CM

## 2012-08-17 NOTE — Patient Instructions (Addendum)
Today we updated your med list in our EPIC system...    Continue your current medications the same...  Today we gave you the 2013 Flu vaccine...  Call for any problems or if we can be of service in any way...  Let's plan a f/u physical in 2014 at whatever time is best for you...    ie - spring, summer, autumn.Marland KitchenMarland Kitchen

## 2012-08-17 NOTE — Progress Notes (Signed)
Subjective:     Patient ID: Ryan [name], male   DOB: Mar 13, 1956, 56 y.o.   MRN: [id number]  HPI 56 y/o WM here for a follow up visit and CPX...   ~  May 23, 2009:  it's been 18yrs since I've seen [name]- states he's feeling well w/o complaints or concerns... unfortunately he continues to have several serious health risks including his continued smoking, HBP, Cholesterol, & Obesity... he notes that he has made great strides in that he's cut back his smoking and has lost 55# down to 299# at present... this should translate into lower BP and Chol values, but he has a long way to go & we discussed all this today...  ~  August 19, 2010:  states he's quit smoking "I only smoke at casinos now"... he's gained 37# back to 336# today... discussed smoking cessation strategies & offered Chantix... discussed need to get back on diet/ exercise/ & start statin Rx w/ LIPITOR 20mg /d... BP borderline w/ weight gain & he made need BP meds if he can't get the weight down... see prob list below:  ~  February 14, 2012:  32mo ROV & CPX> [name]'s CC is his knees, DJD & he is seeing an Orthopedist on [facility] 15mg /d as needed;  Unfortunately his weight is up a further 19# to 355# now in his 5'10" frame for a BMI=51; we discussed bariatric options but he is not interested, states he will "start" diet & continue swimming at the [place] [facility] 5d per week...  We reviewed prob list, meds, xrays and labs> see below>> CXR 4/13 showed norm heart size, clear lungs, NAD... LABS 4/13:  FLP- at goals on Lip20;  Chems- wnl;  CBC- wnl;  TSH=1.90;  PSA=0.15;  UA=clear...  ~  August 17, 2012:  43mo ROV & Scottreports a good interval> he had left knee arthroscopic surg 8/13 by DrNorris for torn meniscus; did well w/ surg & post op rehab; exercise by swimming but unfortunately he has not lost any weight & we reviewed DIET/ EXERCISE/ weight reduction strategies...     He still smokes a few- but only [place] the casino- and denies cough, sputum, SOB, etc;   BP remains OK on diet alone (low sodium) and measures 138/82 today w/o CP, palpit, SOB, edema, etc;  Similarly his Chol is OK on Lip20...    We reviewed prob list, meds, xrays and labs> see below for updates >> OK 2013 Flu vaccine today...   Problem List:   CIGARETTE SMOKER (ICD-305.1) - he had decreased to 2 pks per week previously & states he quit smoking 9/11> "I only smoke in casinos now" & he is encouraged to quit completely & offered Chantix, counselling, etc...  HYPERTENSION (ICD-401.9) - prev on Metoprolol & Accupril but he stopped these on his own when he got serious w/ diet & exercise...  ~  8/10:  weight is down 55# to 299# in 2010 and BP= 130/90 which was better... he is reluctant to restart meds, so we decided on home BP monitoring w/ goal <140/85... ~  10/11:  weight back up to 336# & BP= 142/92, but he doesn't want to restart meds stating that he knows he can improve BP by getting hias weight down w/ diet + exercise... ~  4/13:  Weight up further to 355# & BP= 152/84; denies CP, palpit, SOB, edema; desperately needs to lose the weight or start back on BP meds for cardioprotection. ~  10/13:  BP= 138/82 and he  denies symptoms; weight remains elev at 358# & we reviewed the importance of weight reduction...  VENOUS INSUFFICIENCY (ICD-459.81) - he has chr ven inuffic & edema... he knows to elim sodium, elevate legs, wear support hose... prev on Lasix20  but off all meds during the last yr or so...  HYPERCHOLESTEROLEMIA (ICD-272.0) - on LIPITOR 20mg /d + diet efforts... ~  FLP 7/99 on diet showed TChol 256, TG 342, HDL 47, LDL 141... rec> start Lipitor10. ~  FLP 3/04 on Lip10 showed TChol 156, TG 149, HDL 42, LDL 85 ~  FLP 8/07 on Lip10 showed TChol 158, TG 133, HDL 43, LDL 89 ~  FLP 8/10 on diet showed TChol 213, TG 164, HDL 51, LDL 139... he wants to continue diet Rx. ~  FLP 10/11 on diet showed TChol 206, TG 111, HDL 46, LDL 139... rec> start LIPITOR 20mg /d. ~  FLP 4/13 on Lip20  showed TChol 164, TG 127, HDL 51, LDL 87  OBESITY (ICD-278.00) - best weight per old chart = 277# in 04/09/89, and worst weight = 355# in 2006-04-09... we have had numerous conversations about diet + exercise strategies for weight reduction... 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 ~  he had FlexSig 1991 by DrPatterson w/ int hems and Rx w/ Anusol... 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 f/u planned 28yrs. ~  He is sched to have his f/u colonoscopy 12/13 per DrPatterson...  Hx of FATTY LIVER DISEASE (ICD-571.8) - hx elevated LFT's in the 90's believed related to hepatic steatosis w/ eval by DrPatterson in 04-09-90... he drinks beer on weekends & advised to quit... LFT's have been normal in recent yrs. ~  4/13:  LFTs remain WNL despite weight gain to 355#  DEGENERATIVE JOINT DISEASE (ICD-715.90) - he uses OTC analgesics as needed... ~  s/p right knee arthroscopy 2/05 by DrGioffre for meniscus tear... ~  s/p left knee arthroscopy 8/13 by drNorris for torn meniscus...  ANXIETY (ICD-300.00) - pt's wife died 09-Apr-2002 from breast cancer- 2 daughters & one has grad from [employer] nursing...  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   Past Surgical History  Procedure Date  . Right knee arthroscopy 11/2003    Dr. Darrelyn Hillock  . Left knee arthroscopy 05/24/2012    torn meniscus     Outpatient Encounter Prescriptions as of 08/17/2012  Medication Sig Dispense Refill  . aspirin 325 MG tablet Take 325 mg by mouth daily.      Marland Kitchen atorvastatin (LIPITOR) 20 MG tablet Take 1 tablet (20 mg total)  by mouth daily.  90 tablet  3  . meloxicam (MOBIC) 15 MG tablet Take 1 tablet (15 mg total) by mouth daily as needed.  90 tablet  3  . omeprazole (PRILOSEC OTC) 20 MG tablet Take 20 mg by mouth daily. 30 minutes before a meal      . tadalafil (CIALIS) 20 MG tablet Take 20 mg by mouth daily as needed.      Marland Kitchen DISCONTD: tadalafil (CIALIS) 20  MG tablet Take 1 tablet (20 mg total) by mouth daily as needed for erectile dysfunction.  90 tablet  3    No Known Allergies   Current Medications, Allergies, Past Medical History, Past Surgical History, Family History, and Social History were reviewed in [employer] record.   Review of Systems       The patient denies fever, chills, sweats, anorexia, fatigue, weakness, malaise, weight loss, sleep disorder, blurring, diplopia, eye irritation, eye discharge, vision loss, eye pain, photophobia, earache, ear discharge, tinnitus, decreased hearing, nasal congestion, nosebleeds, sore throat, hoarseness, chest pain, palpitations, syncope, dyspnea on exertion, orthopnea, PND, peripheral edema, cough, dyspnea at rest, excessive sputum, hemoptysis, wheezing, pleurisy, nausea, vomiting, diarrhea, constipation, change in bowel habits, abdominal pain, melena, hematochezia, jaundice, gas/bloating, indigestion/heartburn, dysphagia, odynophagia, dysuria, hematuria, urinary frequency, urinary hesitancy, nocturia, incontinence, back pain, joint pain, joint swelling, muscle cramps, muscle weakness, stiffness, arthritis, sciatica, restless legs, leg pain at night, leg pain with exertion, itching, dryness, suspicious lesions, paralysis, paresthesias, seizures, tremors, vertigo, transient blindness, frequent falls, frequent headaches, difficulty walking, depression, anxiety, memory loss, confusion, cold intolerance, heat intolerance, polydipsia, polyphagia, polyuria, unusual weight change, abnormal bruising, bleeding, enlarged lymph nodes, urticaria, allergic rash,  hay fever, and recurrent infections.     Objective:   Physical Exam     WD, Obese, 56 y/o WM in NAD... GENERAL:  Alert & oriented; pleasant & cooperative... HEENT:  [place]/AT, EOM-wnl, PERRLA, Fundi-benign, EACs-clear, TMs-wnl, NOSE-clear, THROAT-clear & wnl. NECK:  Supple w/ full ROM; no JVD; normal carotid impulses w/o bruits; no thyromegaly or nodules palpated; no lymphadenopathy. CHEST:  Clear to P & A; without wheezes/ rales/ or rhonchi. HEART:  Regular Rhythm; without murmurs/ rubs/ or gallops. ABDOMEN:  Obese, soft & nontender; normal bowel sounds; no organomegaly or masses detected. (RECTAL:  +rectal stricture, tender, can't reach prostate, stool heme neg) EXT: without deformities or arthritic changes; no varicose veins/ venous insuffic/ or edema. NEURO:  CN's intact; motor testing normal; sensory testing normal; gait normal & balance OK. DERM:  No lesions noted; no rash etc...  RADIOLOGY DATA:  Reviewed in the EPIC EMR & discussed w/ the patient...  LABORATORY DATA:  Reviewed in the EPIC EMR & discussed w/ the patient...   Assessment:      HBP>  Borderline BP on "diet" alone; reviewed need for no salt & weight reduction...  Ven Insuffic>  He knows to elim sodium, elev legs, wear support hose, etc...  CHOL>  On Lip20 but needs much better diet & weight reduction...  Obesity>  As above- weight 358# & BMI= 51; needs substantial weight reduction & I have rec bariatric surg for him but he is not inclined...  GI> Divertics, Polyps, Hems, rectal stricture>  He uses OTC Prilosec prn; he has rectal stricture & needs to maintain soft stool due to narrow caliber; neg for blood but due for colonoscopy per DrPatterson soon...  Fatty Liver Dis>  LFTs remain normal despite weight gain...  DJD>  Aware- followed by Ortho on Mobic & we discussed "wear & tear" joints, his weight, etc...     Plan:     Patient's Medications  New Prescriptions   No medications on file  Previous  Medications   ASPIRIN 325 MG TABLET    Take 325 mg by mouth daily.   ATORVASTATIN (LIPITOR) 20 MG TABLET    Take 1 tablet (20 mg total) by mouth daily.   MELOXICAM (MOBIC) 15 MG TABLET    Take 1 tablet (  15 mg total) by mouth daily as needed.   OMEPRAZOLE (PRILOSEC OTC) 20 MG TABLET    Take 20 mg by mouth daily. 30 minutes before a meal   TADALAFIL (CIALIS) 20 MG TABLET    Take 20 mg by mouth daily as needed.  Modified Medications   No medications on file  Discontinued Medications   TADALAFIL (CIALIS) 20 MG TABLET    Take 1 tablet (20 mg total) by mouth daily as needed for erectile dysfunction.

## 2012-09-22 ENCOUNTER — Ambulatory Visit (AMBULATORY_SURGERY_CENTER): Payer: 59

## 2012-09-22 VITALS — Ht 71.0 in | Wt 357.0 lb

## 2012-09-22 DIAGNOSIS — Z1211 Encounter for screening for malignant neoplasm of colon: Secondary | ICD-10-CM

## 2012-10-06 ENCOUNTER — Encounter: Payer: 59 | Admitting: Gastroenterology

## 2013-01-14 ENCOUNTER — Other Ambulatory Visit: Payer: Self-pay | Admitting: Pulmonary Disease

## 2013-02-01 ENCOUNTER — Encounter: Payer: Self-pay | Admitting: Gastroenterology

## 2013-03-03 ENCOUNTER — Ambulatory Visit: Payer: 59 | Admitting: Pulmonary Disease

## 2013-03-03 ENCOUNTER — Telehealth: Payer: Self-pay | Admitting: Pulmonary Disease

## 2013-03-03 NOTE — Telephone Encounter (Signed)
Spoke with pt and rescheduled appt for CPX

## 2013-04-13 ENCOUNTER — Other Ambulatory Visit (INDEPENDENT_AMBULATORY_CARE_PROVIDER_SITE_OTHER): Payer: BC Managed Care – PPO

## 2013-04-13 ENCOUNTER — Ambulatory Visit (INDEPENDENT_AMBULATORY_CARE_PROVIDER_SITE_OTHER): Payer: BC Managed Care – PPO | Admitting: Pulmonary Disease

## 2013-04-13 ENCOUNTER — Telehealth: Payer: Self-pay | Admitting: *Deleted

## 2013-04-13 ENCOUNTER — Encounter: Payer: Self-pay | Admitting: Pulmonary Disease

## 2013-04-13 VITALS — BP 140/98 | HR 86 | Temp 97.9°F | Ht 70.0 in | Wt 364.8 lb

## 2013-04-13 DIAGNOSIS — Z0000 Encounter for general adult medical examination without abnormal findings: Secondary | ICD-10-CM

## 2013-04-13 DIAGNOSIS — I872 Venous insufficiency (chronic) (peripheral): Secondary | ICD-10-CM

## 2013-04-13 DIAGNOSIS — E7800 Pure hypercholesterolemia, unspecified: Secondary | ICD-10-CM

## 2013-04-13 DIAGNOSIS — E669 Obesity, unspecified: Secondary | ICD-10-CM

## 2013-04-13 DIAGNOSIS — M1990 Unspecified osteoarthritis, unspecified site: Secondary | ICD-10-CM

## 2013-04-13 DIAGNOSIS — K7689 Other specified diseases of liver: Secondary | ICD-10-CM

## 2013-04-13 DIAGNOSIS — K5730 Diverticulosis of large intestine without perforation or abscess without bleeding: Secondary | ICD-10-CM

## 2013-04-13 DIAGNOSIS — D126 Benign neoplasm of colon, unspecified: Secondary | ICD-10-CM

## 2013-04-13 DIAGNOSIS — I10 Essential (primary) hypertension: Secondary | ICD-10-CM

## 2013-04-13 LAB — CBC WITH DIFFERENTIAL/PLATELET
Basophils Absolute: 0 10*3/uL (ref 0.0–0.1)
Hemoglobin: 15.1 g/dL (ref 13.0–17.0)
Lymphocytes Relative: 28.5 % (ref 12.0–46.0)
Monocytes Relative: 8.1 % (ref 3.0–12.0)
Neutro Abs: 6.2 10*3/uL (ref 1.4–7.7)
Neutrophils Relative %: 60.9 % (ref 43.0–77.0)
RBC: 4.95 Mil/uL (ref 4.22–5.81)
RDW: 13.8 % (ref 11.5–14.6)

## 2013-04-13 LAB — LIPID PANEL
Cholesterol: 240 mg/dL — ABNORMAL HIGH (ref 0–200)
Triglycerides: 226 mg/dL — ABNORMAL HIGH (ref 0.0–149.0)

## 2013-04-13 LAB — PSA: PSA: 0.2 ng/mL (ref 0.10–4.00)

## 2013-04-13 LAB — BASIC METABOLIC PANEL
CO2: 27 mEq/L (ref 19–32)
Calcium: 10 mg/dL (ref 8.4–10.5)
Creatinine, Ser: 0.8 mg/dL (ref 0.4–1.5)
Glucose, Bld: 90 mg/dL (ref 70–99)

## 2013-04-13 LAB — HEPATIC FUNCTION PANEL
Albumin: 4.6 g/dL (ref 3.5–5.2)
Alkaline Phosphatase: 81 U/L (ref 39–117)
Total Protein: 8.1 g/dL (ref 6.0–8.3)

## 2013-04-13 MED ORDER — MELOXICAM 15 MG PO TABS: ORAL_TABLET | ORAL | Status: DC

## 2013-04-13 MED ORDER — ATORVASTATIN CALCIUM 20 MG PO TABS: ORAL_TABLET | ORAL | Status: DC

## 2013-04-13 MED ORDER — FUROSEMIDE 20 MG PO TABS: 20.0000 mg | ORAL_TABLET | Freq: Every day | ORAL | Status: DC

## 2013-04-13 NOTE — Patient Instructions (Addendum)
Today we updated your med list in our EPIC system...    Continue your current medications the same...  Today we did your follow up FASTING blood work...    We will contact you w/ the results when available...   Let's get on track w/ our diet & exercise program...    The goal is to lose 15-20 lbs...  The Gastroenterology division will give you a call regarding your follow up colon to be done in the hospital XRay dept...  Call for any questions...  Let's plan a follow up visit in 72mo, sooner if needed for problems.Marland KitchenMarland Kitchen

## 2013-04-13 NOTE — Progress Notes (Signed)
Subjective:     Patient ID: Ryan [name], male   DOB: October 09, 1956, 57 y.o.   MRN: [id number]  HPI 57 y/o WM here for a follow up visit and CPX...   ~  May 23, 2009:  it's been 72yrs since I've seen Ryan [name]- states he's feeling well w/o complaints or concerns... unfortunately he continues to have several serious health risks including his continued smoking, HBP, Cholesterol, & Obesity... he notes that he has made great strides in that he's cut back his smoking and has lost 55# down to 299# at present... this should translate into lower BP and Chol values, but he has a long way to go & we discussed all this today...  ~  August 19, 2010:  states he's quit smoking "I only smoke at casinos now"... he's gained 37# back to 336# today... discussed smoking cessation strategies & offered Chantix... discussed need to get back on diet/ exercise/ & start statin Rx w/ LIPITOR 20mg /d... BP borderline w/ weight gain & he made need BP meds if he can't get the weight down... see prob list below:  ~  February 14, 2012:  55mo ROV & CPX> [facility] CC is his knees, DJD & he is seeing an Orthopedist on [facility] 15mg /d as needed;  Unfortunately his weight is up a further 19# to 355# now in his 5'10" frame for a BMI=51; we discussed bariatric options but he is not interested, states he will "start" diet & continue swimming at the [place] [facility] 5d per week...  We reviewed prob list, meds, xrays and labs> see below>> CXR 4/13 showed norm heart size, clear lungs, NAD... LABS 4/13:  FLP- at goals on Lip20;  Chems- wnl;  CBC- wnl;  TSH=1.90;  PSA=0.15;  UA=clear...  ~  August 17, 2012:  59mo ROV & Scottreports a good interval> he had left knee arthroscopic surg 8/13 by DrNorris for torn meniscus; did well w/ surg & post op rehab; exercise by swimming but unfortunately he has not lost any weight & we reviewed DIET/ EXERCISE/ weight reduction strategies...     He still smokes a few- but only at the casino- and denies cough, sputum, SOB, etc;   BP remains OK on diet alone (low sodium) and measures 138/82 today w/o CP, palpit, SOB, edema, etc;  Similarly his Chol is OK on Lip20...    We reviewed prob list, meds, xrays and labs> see below for updates >> OK 2013 Flu vaccine today...  ~  April 13, 2013:  28mo ROV & Ryan [name] informs me that he is not taking his Lip20 as he feels it has caused knee pain (improved off this med); he wants to try Cres10- ok & f/u FLP on the Crestor later... We reviewed the following medical problems during today's office visit >>     Cig smoker> not smoking regularly, "only in casinos" & we discussed smoking cessation, avail meds etc...    HBP> on ASA325 & low sodium diet, no other BP meds; he knows the importance of wt reduction; BP= 140/98 & we will add the Lasix20, low sodium, wt reduction=> told he will need additional meds if he doesn't get wt down!    Ven Insuffic> he knows to avoid sodium, elev legs, wear support hose; he requests Lasix20 prn edema- ok...    CHOL> prev on Lip20- he stopped on his own due to knee pain (better off this med he says); FLP 6/14 on diet alone shows TChol 240, TG 226, HDL 51, LDL162;  he wants to try Cres10...    Obesity> weight is up to 365# w/ assoc incr BP, lipids, etc; everything depends on weight reduction; we reviewed diet, exercise, wt reduction strategies; offered dietary consult etc...    GI- Divertics, Polyps, Hems w/ rectal stenosis> on Prilosec20 prn; he is overdue for f/u colonoscopy- "They couldn't do it due to my weight >350#" but surely it can be done at the hosp & we will call GI to set this up...    NAFLD> he had Hep steatosis diagnosed in the 1990s by DrPatterson; fortunately his LFTs have been ok esp since he hasn't been able to lose any weight; labs 6/14 showed LFTs normal...    GU> on Cialis20 as needed...     DJD> on Mobic15 prn; he has had bilat knee arthroscopies but when his knees started hurting he thought it was the Lip20 & stopped it...    Anxiety> see below- no  requiring anxiolytic meds... We reviewed prob list, meds, xrays and labs> see below for updates >>  LABS 6/14:  FLP- not at goals off statin rx;  Chesm- wnl;  CBC- wnl;  TSH=2.02;  PSA=0.20...          Problem List:   CIGARETTE SMOKER (ICD-305.1) - he had decreased to 2 pks per week previously & states he quit smoking 9/11> "I only smoke in casinos now" & he is encouraged to quit completely & offered Chantix, counselling, etc...  HYPERTENSION (ICD-401.9) - prev on Metoprolol & Accupril but he stopped these on his own when he got serious w/ diet & exercise...  ~  8/10:  weight is down 55# to 299# in 2010 and BP= 130/90 which was better... he is reluctant to restart meds, so we decided on home BP monitoring w/ goal <140/85... ~  10/11:  weight back up to 336# & BP= 142/92, but he doesn't want to restart meds stating that he knows he can improve BP by getting hias weight down w/ diet + exercise... ~  4/13:  Weight up further to 355# & BP= 152/84; denies CP, palpit, SOB, edema; desperately needs to lose the weight or start back on BP meds for cardioprotection. ~  10/13:  BP= 138/82 and he denies symptoms; weight remains elev at 358# & we reviewed the importance of weight reduction... ~  6/14:  on ASA325 & low sodium diet, no other BP meds; he knows the importance of wt reduction; BP= 140/98 & we will add the Lasix20, low sodium, wt reduction=> told he will need additional meds if he doesn't get wt down!  VENOUS INSUFFICIENCY (ICD-459.81) - he has chr ven inuffic & edema... he knows to elim sodium, elevate legs, wear support hose... prev on Lasix20  but off all meds during the last yr or so... ~  6/14:  he knows to avoid sodium, elev legs, wear support hose; he requests Lasix20 prn edema- ok...   HYPERCHOLESTEROLEMIA (ICD-272.0) - on LIPITOR 20mg /d + diet efforts... ~  FLP 7/99 on diet showed TChol 256, TG 342, HDL 47, LDL 141... rec> start Lipitor10. ~  FLP 3/04 on Lip10 showed TChol 156, TG 149,  HDL 42, LDL 85 ~  FLP 8/07 on Lip10 showed TChol 158, TG 133, HDL 43, LDL 89 ~  FLP 8/10 on diet showed TChol 213, TG 164, HDL 51, LDL 139... he wants to continue diet Rx. ~  FLP 10/11 on diet showed TChol 206, TG 111, HDL 46, LDL 139... rec> start LIPITOR 20mg /d. ~  FLP 4/13 on Lip20 showed TChol 164, TG 127, HDL 51, LDL 87 ~  FLP 6/14 off Lip20 now showed TChol 240, TG 226, HDL 51, LDL162   OBESITY (NWG-956.21) - best weight per old chart = 277# in 1989-03-21, and worst weight = 355# in Mar 21, 2006... we have had numerous conversations about diet + exercise strategies for weight reduction... 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 ~  he had FlexSig 1991 by DrPatterson w/ int hems and Rx w/ Anusol... 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 f/u planned 27yrs. ~  He is sched to have his f/u colonoscopy 12/13 per DrPatterson...  Hx of FATTY LIVER DISEASE (ICD-571.8) - hx elevated LFT's in the 90's believed related to hepatic steatosis w/ eval by DrPatterson in 03-21-90... he drinks beer on weekends & advised to quit... LFT's have been normal in recent yrs. ~  4/13:  LFTs remain WNL despite weight gain to 355# ~  6/14:  LFTs remain WNL despite wt gain to 365#  DEGENERATIVE JOINT DISEASE (ICD-715.90) - he uses OTC analgesics as needed... ~  s/p right knee arthroscopy 2/05 by DrGioffre for meniscus tear... ~  s/p left knee arthroscopy 8/13 by drNorris for torn meniscus...  ANXIETY (ICD-300.00) - pt's wife died 03-21-02 from breast cancer- 2 daughters & one has grad from [employer]  nursing...  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   Past Surgical History  Procedure Laterality Date  . Right knee arthroscopy  11/2003    Dr. Darrelyn Hillock  . Left knee arthroscopy  05/24/2012    torn meniscus     Outpatient Encounter Prescriptions as of 04/13/2013  Medication Sig Dispense Refill  . aspirin 325 MG tablet Take 325 mg by mouth daily.      Marland Kitchen atorvastatin (LIPITOR) 20 MG tablet TAKE 1 TABLET DAILY  90 tablet  2  . meloxicam (MOBIC) 15 MG tablet TAKE 1 TABLET DAILY AS NEEDED  90 tablet  2  . omeprazole (PRILOSEC OTC) 20 MG tablet Take 20 mg by mouth daily. 30 minutes before a meal      . tadalafil (CIALIS) 20 MG tablet Take 20 mg by mouth daily as needed.       No facility-administered encounter medications on file as of 04/13/2013.    No Known Allergies   Current Medications, Allergies, Past Medical History, Past Surgical History, Family History, and Social History were reviewed in [employer] record.   Review of Systems       The patient denies fever, chills, sweats, anorexia, fatigue, weakness, malaise, weight loss, sleep disorder, blurring, diplopia, eye irritation, eye discharge, vision loss, eye pain, photophobia, earache, ear discharge, tinnitus, decreased hearing, nasal congestion, nosebleeds, sore throat, hoarseness, chest pain, palpitations, syncope, dyspnea on exertion, orthopnea, PND, peripheral edema, cough, dyspnea at rest, excessive sputum, hemoptysis, wheezing, pleurisy, nausea, vomiting, diarrhea, constipation, change in bowel habits, abdominal pain, melena, hematochezia, jaundice, gas/bloating, indigestion/heartburn, dysphagia, odynophagia, dysuria, hematuria, urinary frequency, urinary  hesitancy, nocturia, incontinence, back pain, joint pain, joint swelling, muscle cramps, muscle weakness, stiffness, arthritis, sciatica, restless legs, leg pain at night, leg pain with exertion, itching, dryness, suspicious  lesions, paralysis, paresthesias, seizures, tremors, vertigo, transient blindness, frequent falls, frequent headaches, difficulty walking, depression, anxiety, memory loss, confusion, cold intolerance, heat intolerance, polydipsia, polyphagia, polyuria, unusual weight change, abnormal bruising, bleeding, enlarged lymph nodes, urticaria, allergic rash, hay fever, and recurrent infections.     Objective:   Physical Exam     WD, Obese, 57 y/o WM in NAD... GENERAL:  Alert & oriented; pleasant & cooperative... HEENT:  [place]/AT, EOM-wnl, PERRLA, Fundi-benign, EACs-clear, TMs-wnl, NOSE-clear, THROAT-clear & wnl. NECK:  Supple w/ full ROM; no JVD; normal carotid impulses w/o bruits; no thyromegaly or nodules palpated; no lymphadenopathy. CHEST:  Clear to P & A; without wheezes/ rales/ or rhonchi. HEART:  Regular Rhythm; without murmurs/ rubs/ or gallops. ABDOMEN:  Obese, soft & nontender; normal bowel sounds; no organomegaly or masses detected. (RECTAL:  +rectal stricture, tender, can't reach prostate, stool heme neg) EXT: without deformities or arthritic changes; no varicose veins/ venous insuffic/ or edema. NEURO:  CN's intact; motor testing normal; sensory testing normal; gait normal & balance OK. DERM:  No lesions noted; no rash etc...  RADIOLOGY DATA:  Reviewed in the EPIC EMR & discussed w/ the patient...  LABORATORY DATA:  Reviewed in the EPIC EMR & discussed w/ the patient...   Assessment:      HBP>  Borderline BP on "diet" alone; reviewed need for no salt & weight reduction=> he understands that this is his last chance to avoid meds...  Ven Insuffic>  He knows to elim sodium, elev legs, wear support hose, etc... OK Lasix20 prn...  CHOL>  Prev on Lip20 but he stopped on his own due to knee pain' FLP is way off & he wants to try Cres10...  Obesity>  As above- weight 365# & BMI= 52; needs substantial weight reduction & I have rec bariatric surg for him but he is not inclined...  GI>  Divertics, Polyps, Hems, rectal stricture>  He uses OTC Prilosec prn; he has rectal stricture & needs to maintain soft stool due to narrow caliber; neg for blood but due for colonoscopy per DrPatterson soon...  Fatty Liver Dis>  LFTs remain normal despite weight gain...  DJD>  Aware- followed by Ortho on Mobic & we discussed "wear & tear" joints, his weight, etc...     Plan:     Patient's Medications  New Prescriptions   FUROSEMIDE (LASIX) 20 MG TABLET    Take 1 tablet (20 mg total) by mouth daily. For swelling that doesn't go down over night   ROSUVASTATIN (CRESTOR) 10 MG TABLET    Take 1 tablet (10 mg total) by mouth daily.  Previous Medications   ASPIRIN 325 MG TABLET    Take 325 mg by mouth daily.   OMEPRAZOLE (PRILOSEC OTC) 20 MG TABLET    Take 20 mg by mouth daily. 30 minutes before a meal   TADALAFIL (CIALIS) 20 MG TABLET    Take 20 mg by mouth daily as needed.  Modified Medications   Modified Medication Previous Medication   CIALIS 20 MG TABLET tadalafil (CIALIS) 20 MG tablet      TAKE 1 TABLET DAILY AS NEEDED FOR ERECTILE DYSFUNCTION    Take 1 tablet (20 mg total) by mouth daily as needed for erectile dysfunction.   MELOXICAM (MOBIC) 15 MG TABLET meloxicam (MOBIC) 15 MG tablet  TAKE 1 TABLET DAILY AS NEEDED    TAKE 1 TABLET DAILY AS NEEDED  Discontinued Medications   ATORVASTATIN (LIPITOR) 20 MG TABLET    TAKE 1 TABLET DAILY   ATORVASTATIN (LIPITOR) 20 MG TABLET    TAKE 1 TABLET DAILY

## 2013-04-14 ENCOUNTER — Telehealth: Payer: Self-pay | Admitting: *Deleted

## 2013-04-14 DIAGNOSIS — E7800 Pure hypercholesterolemia, unspecified: Secondary | ICD-10-CM

## 2013-04-14 MED ORDER — ROSUVASTATIN CALCIUM 10 MG PO TABS: 10.0000 mg | ORAL_TABLET | Freq: Every day | ORAL | Status: DC

## 2013-04-14 NOTE — Telephone Encounter (Signed)
Called and spoke with pt about his lab results per SN.  Pt stated that he has stopped the lipitor about 2 months ago due to joint pain.  Pt requested that he be able to try something else.  Per SN--call in crestor  10 mg  Daily.  This has been sent in to the mail order for the pt.  Pt is aware to return to our lab in early sept to have repeat labs while on this medication.  Pt to call for any problems or concerns.

## 2013-04-15 ENCOUNTER — Other Ambulatory Visit: Payer: Self-pay | Admitting: Pulmonary Disease

## 2013-04-26 NOTE — Telephone Encounter (Signed)
Informed pt Dr Kriste Basque called and he is due a repeat Colonoscopy. Pt had 06/27/09 COLON with adenomatous polyp and Dr Jarold Motto wrote for a recall colon in 3 years. Pt will come for a Pre Visit on 04/28/13 and the Direct Colon on 05/23/13.

## 2013-04-27 ENCOUNTER — Telehealth: Payer: Self-pay | Admitting: *Deleted

## 2013-04-27 NOTE — Telephone Encounter (Signed)
Pt has BMI of 52.34 and weight of 364.8lb.  Graciella Freer talked with Dr. Jarold Motto and he reviewed pt's chart.  He says pt can wait for 1 year to have recall colon.  Pt notified and colonoscopy scheduled for 8/4 cancelled.

## 2013-04-28 ENCOUNTER — Other Ambulatory Visit: Payer: Self-pay | Admitting: Pulmonary Disease

## 2013-04-28 ENCOUNTER — Telehealth: Payer: Self-pay | Admitting: *Deleted

## 2013-04-28 MED ORDER — FUROSEMIDE 20 MG PO TABS: 20.0000 mg | ORAL_TABLET | Freq: Every day | ORAL | Status: DC

## 2013-04-28 NOTE — Telephone Encounter (Signed)
Spoke with pt about his new recall and if he will lose some weight he won't need to have it done at the hospital. Pt stated understanding.

## 2013-04-28 NOTE — Telephone Encounter (Signed)
Message copied by Florene Glen on Thu Apr 28, 2013  7:53 AM ------      Message from: Jarold Motto, DAVID R      Created: Wed Apr 27, 2013  3:26 PM       Five-year followup is okay next year.  Explained to the patient that he really needs to get his BMI down before it is safe for him to have colonoscopy D. anywhere.      ----- Message -----         From: Linna Hoff, RN         Sent: 04/27/2013   2:07 PM           To: Mardella Layman, MD            Dr Kriste Basque called about pt's recall COLON, so I scheduled him for a PV and a Direct COLON. Ezra Sites called to report pt weighs 364 and his BMI is 52. COLON 06/27/2009 revealed multiple polyps in sigmoid and rectum with 3 year recall; please advise. Thanks.       ------

## 2013-05-23 ENCOUNTER — Encounter: Payer: BC Managed Care – PPO | Admitting: Gastroenterology

## 2013-10-18 ENCOUNTER — Encounter: Payer: Self-pay | Admitting: Pulmonary Disease

## 2013-11-02 ENCOUNTER — Ambulatory Visit: Payer: BC Managed Care – PPO | Admitting: Pulmonary Disease

## 2014-01-03 ENCOUNTER — Ambulatory Visit: Payer: BC Managed Care – PPO | Admitting: Pulmonary Disease

## 2014-01-08 ENCOUNTER — Other Ambulatory Visit: Payer: Self-pay | Admitting: Pulmonary Disease

## 2014-01-11 ENCOUNTER — Encounter: Payer: Self-pay | Admitting: Pulmonary Disease

## 2014-01-11 ENCOUNTER — Ambulatory Visit (INDEPENDENT_AMBULATORY_CARE_PROVIDER_SITE_OTHER): Payer: BC Managed Care – PPO | Admitting: Pulmonary Disease

## 2014-01-11 ENCOUNTER — Other Ambulatory Visit (INDEPENDENT_AMBULATORY_CARE_PROVIDER_SITE_OTHER): Payer: BC Managed Care – PPO

## 2014-01-11 VITALS — BP 130/84 | HR 84 | Temp 98.3°F | Ht 70.0 in | Wt 374.0 lb

## 2014-01-11 DIAGNOSIS — M1990 Unspecified osteoarthritis, unspecified site: Secondary | ICD-10-CM

## 2014-01-11 DIAGNOSIS — F411 Generalized anxiety disorder: Secondary | ICD-10-CM

## 2014-01-11 DIAGNOSIS — E669 Obesity, unspecified: Secondary | ICD-10-CM

## 2014-01-11 DIAGNOSIS — D126 Benign neoplasm of colon, unspecified: Secondary | ICD-10-CM

## 2014-01-11 DIAGNOSIS — I10 Essential (primary) hypertension: Secondary | ICD-10-CM

## 2014-01-11 DIAGNOSIS — K7689 Other specified diseases of liver: Secondary | ICD-10-CM

## 2014-01-11 DIAGNOSIS — E7800 Pure hypercholesterolemia, unspecified: Secondary | ICD-10-CM

## 2014-01-11 DIAGNOSIS — K5730 Diverticulosis of large intestine without perforation or abscess without bleeding: Secondary | ICD-10-CM

## 2014-01-11 DIAGNOSIS — I872 Venous insufficiency (chronic) (peripheral): Secondary | ICD-10-CM

## 2014-01-11 DIAGNOSIS — R030 Elevated blood-pressure reading, without diagnosis of hypertension: Secondary | ICD-10-CM

## 2014-01-11 DIAGNOSIS — Z0000 Encounter for general adult medical examination without abnormal findings: Secondary | ICD-10-CM

## 2014-01-11 LAB — CBC WITH DIFFERENTIAL/PLATELET
BASOS PCT: 0.8 % (ref 0.0–3.0)
Basophils Absolute: 0.1 10*3/uL (ref 0.0–0.1)
EOS PCT: 2.2 % (ref 0.0–5.0)
Eosinophils Absolute: 0.2 10*3/uL (ref 0.0–0.7)
HCT: 43.6 % (ref 39.0–52.0)
Hemoglobin: 14.6 g/dL (ref 13.0–17.0)
LYMPHS PCT: 26.7 % (ref 12.0–46.0)
Lymphs Abs: 2.8 10*3/uL (ref 0.7–4.0)
MCHC: 33.3 g/dL (ref 30.0–36.0)
MCV: 90.2 fl (ref 78.0–100.0)
MONO ABS: 0.9 10*3/uL (ref 0.1–1.0)
MONOS PCT: 8.2 % (ref 3.0–12.0)
Neutro Abs: 6.6 10*3/uL (ref 1.4–7.7)
Neutrophils Relative %: 62.1 % (ref 43.0–77.0)
PLATELETS: 290 10*3/uL (ref 150.0–400.0)
RBC: 4.84 Mil/uL (ref 4.22–5.81)
RDW: 14.3 % (ref 11.5–14.6)
WBC: 10.6 10*3/uL — ABNORMAL HIGH (ref 4.5–10.5)

## 2014-01-11 LAB — LIPID PANEL
CHOLESTEROL: 242 mg/dL — AB (ref 0–200)
HDL: 49.6 mg/dL (ref >39.00)
LDL CALC: 160 mg/dL — AB (ref 0–99)
TRIGLYCERIDES: 161 mg/dL — AB (ref 0.0–149.0)
Total CHOL/HDL Ratio: 5
VLDL: 32.2 mg/dL (ref 0.0–40.0)

## 2014-01-11 LAB — BASIC METABOLIC PANEL
BUN: 10 mg/dL (ref 6–23)
CHLORIDE: 105 meq/L (ref 96–112)
CO2: 27 meq/L (ref 19–32)
CREATININE: 0.8 mg/dL (ref 0.4–1.5)
Calcium: 9.9 mg/dL (ref 8.4–10.5)
GFR: 106.34 mL/min (ref >60.00)
GLUCOSE: 95 mg/dL (ref 70–99)
POTASSIUM: 4.6 meq/L (ref 3.5–5.1)
Sodium: 140 mEq/L (ref 135–145)

## 2014-01-11 LAB — HEPATIC FUNCTION PANEL
ALBUMIN: 4.6 g/dL (ref 3.5–5.2)
ALT: 23 U/L (ref 0–53)
AST: 20 U/L (ref 0–37)
Alkaline Phosphatase: 78 U/L (ref 39–117)
BILIRUBIN TOTAL: 0.6 mg/dL (ref 0.3–1.2)
Bilirubin, Direct: 0.1 mg/dL (ref 0.0–0.3)
TOTAL PROTEIN: 7.5 g/dL (ref 6.0–8.3)

## 2014-01-11 LAB — PSA: PSA: 0.18 ng/mL (ref 0.10–4.00)

## 2014-01-11 LAB — TSH: TSH: 2.25 u[IU]/mL (ref 0.35–5.50)

## 2014-01-11 NOTE — Patient Instructions (Signed)
Today we updated your med list in our EPIC system...    Continue your current medications the same...    We decided to re-start the Crestor 10mg  daily...  Today we checked your follow up CXR, EKG, & fasting blood work...    We will contact you w/ the results when available...   Let's plan to have a repeat Lipid profile in 3-52mo on the Crestor10 to assess its efficacy...  Call for any questions or if we can be of service in any way.Marland KitchenMarland Kitchen

## 2014-01-11 NOTE — Progress Notes (Signed)
Subjective:     Patient ID: NEZIAH BRALEY, male   DOB: 10/25/1955, 58 y.o.   MRN: [id number]  HPI 58 y/o WM here for a follow up visit and CPX...   ~  February 14, 2012:  69mo ROV & CPX> Tomislav's CC is his knees, DJD & he is seeing an Orthopedist on [facility] 15mg /d as needed;  Unfortunately his weight is up a further 19# to 355# now in his 5'10" frame for a BMI=51; we discussed bariatric options but he is not interested, states he will "start" diet & continue swimming at the [place] per week...  We reviewed prob list, meds, xrays and labs> see below>>  CXR 4/13 showed norm heart size, clear lungs, NAD...  LABS 4/13:  FLP- at goals on Lip20;  Chems- wnl;  CBC- wnl;  TSH=1.90;  PSA=0.15;  UA=clear...  ~  August 17, 2012:  36mo ROV & Scottreports a good interval> he had left knee arthroscopic surg 8/13 by DrNorris for torn meniscus; did well w/ surg & post op rehab; exercise by swimming but unfortunately he has not lost any weight & we reviewed DIET/ EXERCISE/ weight reduction strategies...     He still [place] a few[place]- and denies cough, sputum, SOB, etc;  BP remains OK on diet alone (low sodium) and measures 138/82 today w/o CP, palpit, SOB, edema, etc;  Similarly his Chol is OK on Lip20...    We reviewed prob list, meds, xrays and labs> see below for updates >> OK 2013 Flu vaccine today...  ~  April 13, 2013:  75mo ROV & Rexford informs me that he is not taking his Lip20 as he feels it has caused knee pain (improved off this med); he wants to try Cres10- ok & f/u FLP on the Crestor later... We reviewed the following medical problems during today's office visit >>     Cig smoker> not smoking regularly, "only in casinos" & we discussed smoking cessation, avail meds etc...    HBP> on ASA325 & low sodium diet, no other BP meds; he knows the importance of wt reduction; BP= 140/98 & we will add the Lasix20, low sodium, wt reduction=> told he will need additional meds if he doesn't get wt  down!    Ven Insuffic> he knows to avoid sodium, elev legs, wear support hose; he requests Lasix20 prn edema- ok...    CHOL> prev on Lip20- he stopped on his own due to knee pain (better off this med he says); FLP 6/14 on diet alone shows TChol 240, TG 226, HDL 51, LDL162; he wants to try Cres10...    Obesity> weight is up to 365# w/ assoc incr BP, lipids, etc; everything depends on weight reduction; we reviewed diet, exercise, wt reduction strategies; offered dietary consult etc...    GI- Divertics, Polyps, Hems w/ rectal stenosis> on Prilosec20 prn; he is overdue for f/u colonoscopy- "They couldn't do it due to my weight >350#" but surely it can be done at the hosp & we will call GI to set this up...    NAFLD> he had Hep steatosis diagnosed in the 1990s by DrPatterson; fortunately his LFTs have been ok esp since he hasn't been able to lose any weight; labs 6/14 showed LFTs normal...    GU> on Cialis20 as needed...     DJD> on Mobic15 prn; he has had bilat knee arthroscopies but when his knees started hurting he thought it was the Lip20 & stopped  it...    Anxiety> see below- no requiring anxiolytic meds... We reviewed prob list, meds, xrays and labs> see below for updates >>   LABS 6/14:  FLP- not at goals off statin rx;  Chesm- wnl;  CBC- wnl;  TSH=2.02;  PSA=0.20...  ~  January 11, 2014:  38mo ROV & unfortunately Cannan has gained 10# up to 374# & he understands the critically important need to lose the weight & get healthier;  He has no new complaints or concerns;  We reviewed the following medical problems during today's office visit >>     Cig smoker> not smoking regularly, "only in casinos" & we discussed smoking cessation, avail meds etc; not requiring breathing meds...    HBP> on XTK240 & low sodium diet, prn Lasix20, no other BP meds; he knows the importance of wt reduction; BP= 130/84 & he denies HA, CP, palpit, SOB, edema...    Ven Insuffic> he knows to avoid sodium, elev legs, wear support  hose; he requests Lasix20 prn edema- ok...    CHOL> prev on Lip20- he stopped on his own due to knee pain (better off this med he says); FLP 3/15 on diet alone shows TChol 242, TG 161, HDL 50, LDL160; he wants to try Cres10=> recheck labs 62mo.    Obesity> weight is up to 374# w/ assoc incr BP, lipids, etc; everything depends on weight reduction; we reviewed diet, exercise, wt reduction strategies; offered dietary consult etc...    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    NAFLD> he had Hep steatosis diagnosed in the 1990s by DrPatterson; fortunately his LFTs have been ok esp since he hasn't been able to lose any weight; labs 3/15 showed LFTs normal...    GU> on Cialis20 as needed...     DJD> on Mobic15 prn; he has had bilat knee arthroscopies but when his knees started hurting he thought it was the Lip20 & stopped it...    Anxiety> see below- no requiring anxiolytic meds... We reviewed prob list, meds, xrays and labs> see below for updates >>   CXR 3/15> he forgot to go to [facility] for the needed film...  EKG 3/15 showed NSR, rate88, one PVC, otherw wnl...  LABS 3/15:  FLP- not at goals on diet alone, rec to start Cres10 7 recheck 47mo;  Chems- wnl;  CBC- wnl;  TSH=2.25;  PSA=0.18...           Problem List:   CIGARETTE SMOKER (ICD-305.1) - he had decreased to 2 pks per week previously & states he quit smoking 9/11> "I only smoke in casinos now" & he is encouraged to quit completely & offered Chantix, counselling, etc...  HYPERTENSION (ICD-401.9) - prev on Metoprolol & Accupril but he stopped these on his own when he got serious w/ diet & exercise...  ~  8/10:  weight is down 55# to 299# in 2010 and BP= 130/90 which was better... he is reluctant to restart meds, so we decided on home BP monitoring w/ goal <140/85... ~  10/11:  weight back up to 336# & BP=  142/92, but he doesn't want to restart meds stating that he knows he can improve BP by getting hias weight down w/ diet + exercise... ~  4/13:  Weight up further to 355# & BP= 152/84; denies CP, palpit, SOB, edema; desperately needs to lose  the weight or start back on BP meds for cardioprotection. ~  10/13:  BP= 138/82 and he denies symptoms; weight remains elev at 358# & we reviewed the importance of weight reduction... ~  6/14:  on ASA325 & low sodium diet, no other BP meds; he knows the importance of wt reduction; BP= 140/98 & we will add the Lasix20, low sodium, wt reduction=> told he will need additional meds if he doesn't get wt down! ~  3/15:  on ASA325 & low sodium diet, prn Lasix20, no other BP meds; he knows the importance of wt reduction; BP= 130/84 & he denies HA, CP, palpit, SOB, edema...  VENOUS INSUFFICIENCY (ICD-459.81) - he has chr ven inuffic & edema... he knows to elim sodium, elevate legs, wear support hose... prev on Lasix20  but off all meds during the last yr or so... ~  6/14:  he knows to avoid sodium, elev legs, wear support hose; he requests Lasix20 prn edema- ok...   HYPERCHOLESTEROLEMIA (ICD-272.0) - on LIPITOR 20mg /d + diet efforts... ~  [place] 7/99 on diet showed TChol 256, TG 342, HDL 47, LDL 141... rec> start [place]. ~  [place] 3/04 on Lip10 showed TChol 156, TG 149, HDL 42, LDL 85 ~  FLP 8/07 on Lip10 showed TChol 158, TG 133, HDL 43, LDL 89 ~  FLP 8/10 on diet showed TChol 213, TG 164, HDL 51, LDL 139... he wants to continue diet Rx. ~  FLP 10/11 on diet showed TChol 206, TG 111, HDL 46, LDL 139... rec> start LIPITOR 20mg /d. ~  FLP 4/13 on Lip20 showed TChol 164, TG 127, HDL 51, LDL 87 ~  FLP 6/14 off Lip20 now showed TChol 240, TG 226, HDL 51, LDL162  ~  FLP 3/15 on diet alone showed TChol 242, TG 161, HDL 50, LDL160   OBESITY (ICD-278.00) - best weight per old chart = 277# in 1990, and worst weight = 355# in 2007... we have had numerous conversations about diet +  exercise strategies for weight reduction... 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 ~  he had FlexSig 1991 by DrPatterson w/ int hems and Rx w/ Anusol... 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 f/u planned 14yrs. ~  He is sched to have his f/u colonoscopy 12/13 per DrPatterson=> he has not had this done yet.  Hx of FATTY LIVER DISEASE (ICD-571.8) - hx elevated LFT's in the 90's believed related to hepatic steatosis w/ eval by DrPatterson in 1991... he drinks beer on weekends & advised to quit... LFT's have been normal in recent yrs. ~  4/13:  LFTs remain WNL despite weight gain to 355# ~  6/14:  LFTs remain WNL despite wt gain to 365# ~  3/15:  LFTs remain wnl despite his weight of 374#  DEGENERATIVE JOINT DISEASE (ICD-715.90) - he uses OTC analgesics as needed... ~  s/p right knee arthroscopy 2/05 by DrGioffre for meniscus tear... ~  s/p left  knee arthroscopy 8/13 by drNorris for torn meniscus...  ANXIETY (ICD-300.00) - pt's wife died February 07, 2002 from breast cancer- 2 daughters & one has grad from [employer]...  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   Past Surgical History  Procedure Laterality Date  . Right knee arthroscopy  11/2003    Dr. Gladstone Lighter  . Left knee arthroscopy  05/24/2012    torn meniscus     Outpatient Encounter Prescriptions as of 01/11/2014  Medication Sig  . aspirin 325 MG tablet Take 325 mg by mouth daily.  Marland Kitchen CIALIS 20 MG tablet TAKE 1 TABLET DAILY AS NEEDED FOR ERECTILE DYSFUNCTION  .  furosemide (LASIX) 20 MG tablet Take 1 tablet (20 mg total) by mouth daily. For swelling that doesn't go down over night  . meloxicam (MOBIC) 15 MG tablet TAKE 1 TABLET DAILY AS NEEDED  . omeprazole (PRILOSEC OTC) 20 MG tablet Take 20 mg by mouth daily. 30 minutes before a meal  . rosuvastatin (CRESTOR) 10 MG tablet Take 1 tablet (10 mg total) by mouth daily.  . [DISCONTINUED] meloxicam (MOBIC) 15 MG tablet TAKE 1 TABLET DAILY AS NEEDED  . [DISCONTINUED] tadalafil (CIALIS) 20 MG tablet Take 20 mg by mouth daily as needed.    No Known Allergies   Current Medications, Allergies, Past Medical History, Past Surgical History, Family History, and Social History were reviewed in [employer] record.   Review of Systems       The patient denies fever, chills, sweats, anorexia, fatigue, weakness, malaise, weight loss, sleep disorder, blurring, diplopia, eye irritation, eye discharge, vision loss, eye pain, photophobia, earache, ear discharge, tinnitus, decreased hearing, nasal congestion, nosebleeds, sore throat, hoarseness, chest pain, palpitations, syncope, dyspnea on exertion, orthopnea, PND, peripheral edema, cough, dyspnea at rest, excessive sputum, hemoptysis, wheezing, pleurisy, nausea, vomiting, diarrhea, constipation, change in bowel habits, abdominal pain, melena, hematochezia, jaundice, gas/bloating, indigestion/heartburn, dysphagia, odynophagia, dysuria, hematuria, urinary frequency, urinary hesitancy, nocturia, incontinence, back pain, joint pain, joint swelling, muscle cramps, muscle weakness, stiffness, arthritis, sciatica, restless legs, leg pain at night, leg pain with exertion, itching, dryness, suspicious lesions, paralysis, paresthesias, seizures, tremors, vertigo, transient blindness, frequent falls, frequent headaches, difficulty walking, depression, anxiety, memory loss, confusion, cold intolerance, heat intolerance, polydipsia, polyphagia, polyuria, unusual weight  change, abnormal bruising, bleeding, enlarged lymph nodes, urticaria, allergic rash, hay fever, and recurrent infections.     Objective:   Physical Exam     WD, Obese, 58 y/o WM in NAD... GENERAL:  Alert & oriented; pleasant & cooperative... HEENT:  [place]/AT, EOM-wnl, PERRLA, [place]i-benign, EACs-clear, TMs-wnl, NOSE-clear, THROAT-clear & wnl. NECK:  Supple w/ full ROM; no JVD; normal carotid impulses w/o bruits; no thyromegaly or nodules palpated; no lymphadenopathy. CHEST:  Clear to P & A; without wheezes/ rales/ or rhonchi. HEART:  Regular Rhythm; without murmurs/ rubs/ or gallops. ABDOMEN:  Obese, soft & nontender; normal bowel sounds; no organomegaly or masses detected. (RECTAL:  +rectal stricture, tender, can't reach prostate, stool heme neg) EXT: without deformities or arthritic changes; no varicose veins/ venous insuffic/ or edema. NEURO:  CN's intact; motor testing normal; sensory testing normal; gait normal & balance OK. DERM:  No lesions noted; no rash etc...  RADIOLOGY DATA:  Reviewed in the EPIC EMR & discussed w/ the patient...  LABORATORY DATA:  Reviewed in the EPIC EMR & discussed w/ the patient...   Assessment:      HBP>  Borderline BP on "diet" alone; reviewed need for no salt & weight reduction=>  he understands that this is his last chance to avoid meds...  Ven Insuffic>  He knows to elim sodium, elev legs, wear support hose, etc... OK Lasix20 prn...  CHOL>  Prev on Lip20 but he stopped on his own due to knee pain' FLP is way off & he wants to try Cres10- ok...  Obesity>  As above- weight 374# & BMI= 53; needs substantial weight reduction & I have rec bariatric surg for him but he is not inclined...  GI> Divertics, Polyps, Hems, rectal stricture>  He uses OTC Prilosec prn; he has rectal stricture & needs to maintain soft stool due to narrow caliber; neg for blood but due for colonoscopy per DrPatterson soon...  Fatty Liver Dis>  LFTs remain normal despite weight  gain...  DJD>  Aware- followed by Ortho on Mobic & we discussed "wear & tear" joints, his weight, etc...     Plan:     Patient's Medications  New Prescriptions   No medications on file  Previous Medications   ASPIRIN 325 MG TABLET    Take 325 mg by mouth daily.   CIALIS 20 MG TABLET    TAKE 1 TABLET DAILY AS NEEDED FOR ERECTILE DYSFUNCTION   MELOXICAM (MOBIC) 15 MG TABLET    TAKE 1 TABLET DAILY AS NEEDED   OMEPRAZOLE (PRILOSEC OTC) 20 MG TABLET    Take 20 mg by mouth daily. 30 minutes before a meal   ROSUVASTATIN (CRESTOR) 10 MG TABLET    Take 1 tablet (10 mg total) by mouth daily.  Modified Medications   Modified Medication Previous Medication   FUROSEMIDE (LASIX) 20 MG TABLET furosemide (LASIX) 20 MG tablet      TAKE 1 TABLET (20 MG TOTAL) DAILY FOR SWELLING THAT DOESNT GO DOWN OVERNIGHT    Take 1 tablet (20 mg total) by mouth daily. For swelling that doesn't go down over night  Discontinued Medications   MELOXICAM (MOBIC) 15 MG TABLET    TAKE 1 TABLET DAILY AS NEEDED   TADALAFIL (CIALIS) 20 MG TABLET    Take 20 mg by mouth daily as needed.

## 2014-02-20 ENCOUNTER — Other Ambulatory Visit: Payer: Self-pay | Admitting: Pulmonary Disease

## 2014-03-06 ENCOUNTER — Other Ambulatory Visit: Payer: Self-pay | Admitting: Pulmonary Disease

## 2014-03-06 DIAGNOSIS — D126 Benign neoplasm of colon, unspecified: Secondary | ICD-10-CM

## 2014-04-18 ENCOUNTER — Encounter: Payer: Self-pay | Admitting: Gastroenterology

## 2014-05-11 ENCOUNTER — Other Ambulatory Visit: Payer: Self-pay | Admitting: Pulmonary Disease

## 2014-05-21 ENCOUNTER — Other Ambulatory Visit: Payer: Self-pay | Admitting: Pulmonary Disease

## 2014-07-07 ENCOUNTER — Other Ambulatory Visit: Payer: Self-pay | Admitting: Pulmonary Disease

## 2014-07-10 ENCOUNTER — Ambulatory Visit: Payer: BC Managed Care – PPO | Admitting: Internal Medicine

## 2014-09-05 ENCOUNTER — Other Ambulatory Visit: Payer: Self-pay | Admitting: Pulmonary Disease

## 2015-01-16 ENCOUNTER — Other Ambulatory Visit: Payer: Self-pay | Admitting: Pulmonary Disease

## 2015-01-21 ENCOUNTER — Encounter: Payer: Self-pay | Admitting: Gastroenterology

## 2015-03-03 ENCOUNTER — Other Ambulatory Visit: Payer: Self-pay | Admitting: Pulmonary Disease

## 2015-06-01 ENCOUNTER — Other Ambulatory Visit: Payer: Self-pay | Admitting: Pulmonary Disease

## 2015-08-31 ENCOUNTER — Other Ambulatory Visit: Payer: Self-pay | Admitting: Pulmonary Disease

## 2015-09-26 ENCOUNTER — Encounter: Payer: Self-pay | Admitting: Gastroenterology

## 2016-12-31 ENCOUNTER — Ambulatory Visit (INDEPENDENT_AMBULATORY_CARE_PROVIDER_SITE_OTHER): Payer: Managed Care, Other (non HMO) | Admitting: Pulmonary Disease

## 2016-12-31 ENCOUNTER — Other Ambulatory Visit (INDEPENDENT_AMBULATORY_CARE_PROVIDER_SITE_OTHER): Payer: Managed Care, Other (non HMO)

## 2016-12-31 ENCOUNTER — Ambulatory Visit (INDEPENDENT_AMBULATORY_CARE_PROVIDER_SITE_OTHER)
Admission: RE | Admit: 2016-12-31 | Discharge: 2016-12-31 | Disposition: A | Discharge status: Home / Self Care | Payer: Managed Care, Other (non HMO) | Source: Ambulatory Visit | Attending: Pulmonary Disease | Admitting: Pulmonary Disease

## 2016-12-31 VITALS — BP 138/74 | HR 105 | Temp 99.0°F | Ht 70.0 in | Wt >= 6400 oz

## 2016-12-31 DIAGNOSIS — R0609 Other forms of dyspnea: Secondary | ICD-10-CM | POA: Diagnosis not present

## 2016-12-31 DIAGNOSIS — Z0000 Encounter for general adult medical examination without abnormal findings: Secondary | ICD-10-CM

## 2016-12-31 DIAGNOSIS — R0600 Dyspnea, unspecified: Secondary | ICD-10-CM | POA: Insufficient documentation

## 2016-12-31 DIAGNOSIS — R030 Elevated blood-pressure reading, without diagnosis of hypertension: Secondary | ICD-10-CM

## 2016-12-31 DIAGNOSIS — R609 Edema, unspecified: Secondary | ICD-10-CM | POA: Diagnosis not present

## 2016-12-31 DIAGNOSIS — E7800 Pure hypercholesterolemia, unspecified: Secondary | ICD-10-CM

## 2016-12-31 DIAGNOSIS — R7989 Other specified abnormal findings of blood chemistry: Secondary | ICD-10-CM

## 2016-12-31 DIAGNOSIS — I872 Venous insufficiency (chronic) (peripheral): Secondary | ICD-10-CM

## 2016-12-31 LAB — COMPREHENSIVE METABOLIC PANEL
ALK PHOS: 77 U/L (ref 39–117)
ALT: 31 U/L (ref 0–53)
AST: 24 U/L (ref 0–37)
Albumin: 4.7 g/dL (ref 3.5–5.2)
BILIRUBIN TOTAL: 0.6 mg/dL (ref 0.2–1.2)
BUN: 16 mg/dL (ref 6–23)
CO2: 29 meq/L (ref 19–32)
Calcium: 10.1 mg/dL (ref 8.4–10.5)
Chloride: 102 mEq/L (ref 96–112)
Creatinine, Ser: 0.93 mg/dL (ref 0.40–1.50)
GFR: 88.44 mL/min (ref >60.00)
GLUCOSE: 108 mg/dL — AB (ref 70–99)
POTASSIUM: 4.9 meq/L (ref 3.5–5.1)
SODIUM: 141 meq/L (ref 135–145)
TOTAL PROTEIN: 7.4 g/dL (ref 6.0–8.3)

## 2016-12-31 LAB — CBC WITH DIFFERENTIAL/PLATELET
BASOS ABS: 0.1 10*3/uL (ref 0.0–0.1)
Basophils Relative: 0.7 % (ref 0.0–3.0)
Eosinophils Absolute: 0.2 10*3/uL (ref 0.0–0.7)
Eosinophils Relative: 1.5 % (ref 0.0–5.0)
HCT: 45.7 % (ref 39.0–52.0)
HEMOGLOBIN: 15.4 g/dL (ref 13.0–17.0)
LYMPHS ABS: 2.5 10*3/uL (ref 0.7–4.0)
Lymphocytes Relative: 20.7 % (ref 12.0–46.0)
MCHC: 33.8 g/dL (ref 30.0–36.0)
MCV: 90.2 fl (ref 78.0–100.0)
MONO ABS: 1.1 10*3/uL — AB (ref 0.1–1.0)
Monocytes Relative: 9.3 % (ref 3.0–12.0)
NEUTROS PCT: 67.8 % (ref 43.0–77.0)
Neutro Abs: 8.1 10*3/uL — ABNORMAL HIGH (ref 1.4–7.7)
Platelets: 299 10*3/uL (ref 150.0–400.0)
RBC: 5.07 Mil/uL (ref 4.22–5.81)
RDW: 13.8 % (ref 11.5–15.5)
WBC: 12 10*3/uL — AB (ref 4.0–10.5)

## 2016-12-31 LAB — LIPID PANEL
Cholesterol: 263 mg/dL — ABNORMAL HIGH (ref 0–200)
HDL: 50.6 mg/dL (ref >39.00)
NONHDL: 212.45
Total CHOL/HDL Ratio: 5
Triglycerides: 215 mg/dL — ABNORMAL HIGH (ref 0.0–149.0)
VLDL: 43 mg/dL — AB (ref 0.0–40.0)

## 2016-12-31 LAB — PSA: PSA: 0.2 ng/mL (ref 0.10–4.00)

## 2016-12-31 LAB — LDL CHOLESTEROL, DIRECT: LDL DIRECT: 181 mg/dL

## 2016-12-31 LAB — TESTOSTERONE: TESTOSTERONE: 174.36 ng/dL — AB (ref 300.00–890.00)

## 2016-12-31 LAB — TSH: TSH: 4.1 u[IU]/mL (ref 0.35–4.50)

## 2016-12-31 NOTE — Patient Instructions (Signed)
Ryan [name], it was great seeing you again...  We outlined a cardio-respiratory evaluation for your shortness of breath, and it all begins w/ losing weight -- we've reviewed (again) diet + exercise but we understand the reasons this has NOT been effective in the past.  Therefore I once again make the plea that you consider BARIATRIC SURGERY as a life saving procedure for you...    The whole process starts w/ you calling [facility] at [phone] for DrNewman/ Martin/ Rosenbower et al...  Today we did a follow up CXR, EKG, ambulatory oximetry, & Spirometry... Please return to our lab one morning soon for your FASTING blood work... We will arrange for a 2DEchocardiogram & an overnight outpt SLEEP Test...    We will contact you w/ the results when available & see where this leads [place]...  In addition you are overdue for a repeat colonoscopy - we will sched this later once the cardio-respiratory work up is complete...  Call for any questions or concerns.Marland KitchenMarland Kitchen

## 2016-12-31 NOTE — Progress Notes (Signed)
Subjective:     Patient ID: MAYA SCHOLER, male   DOB: 1956-07-29, 61 y.o.   MRN: [id number]  HPI 61 y/o WM here for a follow up visit and CPX...   ~  February 14, 2012:  23mo ROV & CPX (age56)> Chevis's CC is his knees, DJD & he is seeing an Orthopedist on [facility] 15mg /d as needed;  Unfortunately his weight is up a further 19# to 355# now in his 5'10" frame for a BMI=51; we discussed bariatric options but he is not interested, states he will "start" diet & continue swimming at the [place] per week...  We reviewed prob list, meds, xrays and labs> see below>>  CXR 4/13 showed norm heart size, clear lungs, NAD...  LABS 4/13:  FLP- at goals on Lip20;  Chems- wnl;  CBC- wnl;  TSH=1.90;  PSA=0.15;  UA=clear...  ~  August 17, 2012:  52mo ROV & Markell reports a good interval> he had left knee arthroscopic surg 8/13 by DrNorris for torn meniscus; did well w/ surg & post op rehab; exercise by swimming but unfortunately he has not lost any weight & we reviewed DIET/ EXERCISE/ weight reduction strategies...     He still smokes a few- [place]- and denies cough, sputum, SOB, etc;  BP remains OK on diet alone (low sodium) and measures 138/82 today w/o CP, palpit, SOB, edema, etc;  Similarly his Chol is OK on Lip20...    We reviewed prob list, meds, xrays and labs> see below for updates >> OK 2013 Flu vaccine today...  ~  April 13, 2013:  32mo ROV & Autumn informs me that he is not taking his Lip20 as he feels it has caused knee pain (improved off this med); he wants to try Cres10- ok & f/u FLP on the Crestor later... We reviewed the following medical problems during today's office visit >>     Cig smoker> not smoking regularly, "only in casinos" & we discussed smoking cessation, avail meds etc...    HBP> on ASA325 & low sodium diet, no other BP meds; he knows the importance of wt reduction; BP= 140/98 & we will add the Lasix20, low sodium, wt reduction=> told he will need additional meds if he doesn't  get wt down!    Ven Insuffic> he knows to avoid sodium, elev legs, wear support hose; he requests Lasix20 prn edema- now asked to take one daily for this & BP.    CHOL> prev on Lip20- he stopped on his own due to knee pain (better off this med he says); FLP 6/14 on diet alone shows TChol 240, TG 226, HDL 51, LDL162; he wants to try Cres10-ok...    Obesity> weight is up to 365# w/ assoc incr BP, lipids, etc; everything depends on weight reduction; we reviewed diet, exercise, wt reduction strategies; offered dietary consult etc; he refuses to consider bariatric surg...    GI- Divertics, Polyps, Hems w/ rectal stenosis> on Prilosec20 prn; he is overdue for f/u colonoscopy- "They couldn't do it due to my weight >350#" but surely it can be done at the hosp & we will call GI to set this up...    NAFLD> he had Hep steatosis diagnosed in the 1990s by DrPatterson; fortunately his LFTs have been ok esp since he hasn't been able to lose any weight; labs 6/14 showed LFTs normal...    GU> on Cialis20 as needed...     DJD> on Mobic15 prn; he has had  bilat knee arthroscopies but when his knees started hurting he thought it was the Lip20 & stopped it...    Anxiety> see below- no requiring anxiolytic meds... We reviewed prob list, meds, xrays and labs> see below for updates >>   LABS 6/14:  FLP- not at goals off statin rx;  Chems- wnl;  CBC- wnl;  TSH=2.02;  PSA=0.20...  ~  January 11, 2014:  47mo ROV & unfortunately Loic has gained 10# further up to 374# & he understands the critically important need to lose the weight & get healthier;  He has no new complaints or concerns;  We reviewed the following medical problems during today's office visit >>     Cig smoker> not smoking regularly, "only in casinos" & we discussed smoking cessation, avail meds etc; not requiring breathing meds...    HBP> on PIR518 & low sodium diet, prn Lasix20, no other BP meds; he knows the importance of wt reduction; BP= 130/84 & he denies HA,  CP, palpit, SOB, edema...    Ven Insuffic> he knows to avoid sodium, elev legs, wear support hose; he requests Lasix20 prn edema- ok...    CHOL> prev on Lip20- he stopped on his own due to knee pain (better off this med he says); FLP 3/15 on diet alone shows TChol 242, TG 161, HDL 50, LDL160; he never started the Cres10=> do it NOW & recheck labs 27mo.    Obesity> weight is up to 374# w/ assoc incr BP, lipids, etc; everything depends on weight reduction; we reviewed diet, exercise, wt reduction strategies; offered dietary consult etc & he refused to consider Bariatric surg....    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    NAFLD> he had Hep steatosis diagnosed in the 1990s by DrPatterson; fortunately his LFTs have been ok esp since he hasn't been able to lose any weight; labs 3/15 showed LFTs normal...    GU> on Cialis20 as needed...     DJD> on Mobic15 prn; he has had bilat knee arthroscopies but when his knees started hurting he thought it was the Lip20 & stopped it...    Anxiety> see below- no requiring anxiolytic meds... We reviewed prob list, meds, xrays and labs> see below for updates >>   CXR 3/15> he forgot to go to [facility] for the needed film...  EKG 3/15 showed NSR, rate88, one PVC, otherw wnl...  LABS 3/15:  FLP- not at goals on diet alone, rec to start Cres10 & recheck 76mo;  Chems- wnl;  CBC- wnl;  TSH=2.25;  PSA=0.18...    ~  December 31, 2016:  43yr Harlan disappeared after his 12/2013 CPX- not a single EPIC entry in the last 34yrs!  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 We updated his problem list as follows>>     R/O OSA> now up to >400 lbs, he wakes ~4am tired, no prob driving, naps 7/7 in afternoon, to bed by 8pm for 8H sleep, wakes x2 to urinate & feels that he sleeps well, denies snoring & sleeps on his side (wife passed away yrs ago).    Ex-Cig smoker> prev "only in casinos" & he says none for ~67yr now; we discussed importance of smoking cessation; he is not requiring breathing meds so far...    HBP> on GDJ242 & low sodium diet, not on BP meds; he knows the importance of sodium restriction & wt reduction; BP= 138/74 & he denies HA, CP, palpit, dizzy, ch in edema...    Ven Insuffic> he knows to avoid sodium, elev legs, wear support hose; prev treated w/ Lasix20 prn edema...    CHOL> prev on Lip- he stopped on his own due to knee pain (better off this med he says); FLP 3/15 on diet alone shows TChol 242, TG 161, HDL 50, LDL160 and rec to take Cres10 (never did); Labs 3/18 showed TChol 263, TG 215, HDL 51, LDL 181 => discussed trial Atorva40 w/ f/u labs in several months...    Obesity> weight is up to 410# w/ assoc incr BP, lipids, etc; everything depends on weight reduction; we reviewed diet, exercise, wt reduction strategies; offered dietary consult etc & we discussed the need for Bariatric surg...    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    NAFLD> he had Hep steatosis diagnosed in the 1990s by DrPatterson; fortunately his LFTs have been ok esp since he  hasn't been able to lose any weight; labs 3/18 showed LFTs remain normal...    GU> prev on Cialis20 as needed for ED but he has symptoms of Low-T w/ decr libido, weakness, etc; Labs 3/18 showed Testos level = 174 (300-890) & we will recheck level & start topical product per protocol...    DJD> on Mobic15 prn; he has had bilat knee arthroscopies but when his knees started hurting he thought it was the Lip20 & stopped it, he's been told it's bone-on-bone..    Anxiety> see below- no requiring anxiolytic meds... EXAM shows Afeb, VSS w/ BP=138/74, P=104/reg, Wt=410#, 5'10"Tall, BMI=59; HEENT- neg, mallampati3; Chest- clear w/o w/r/r;  Heart- RR w/o m/r/g; Abd- obese w/ panniculus, soft nontender; Ext- VI 1+edema w/o c/c; Neuro- ok.   CXR 12/31/16 (independently reviewed by me in the PACS system) showed heart at upper lim of norm size, mild bronch wall thickening & bilat pleural thickening, otherw clear, NAD.Marland KitchenMarland Kitchen  EKG 12/31/16 showed STachy, rate 102, late transition, no acute changes...  Ambulatory Oximetry 12/31/16> O2sat=96% on RA at rest w/ pulse=106/min;  He ambulated 3 laps (185'ea) w/ lowest O2sat=94% w/ pulse=132/min;  We were unable to do Spirometry today- machine down.  LABS 12/31/16> FLP- not at goals on diet alone; Chems- ok x BS=108 (Cr=0.93 & LFTs wnl); CBC- wnl x WBC=12.0 w/ norm diff; TSH=4.10; PSA=0.20; Testos=174 (300-890)  2DEcho 01/20/17 showed mod conc LVH, norm LVF w/ EF=55-60% & no RWMA, Gr1DD, mildly dil AscAo at 28mm, norm AoV, MV-ok x triv MR, LA mod dil at 32mm, RV & PAsys are wnl... IMP>>  His DYSPNEA is clearly multifactorial w/ components from morbid obesity, deconditioning, prob pulm restriction, ex-smoker, r/o OSA, r/o cardiac problems (LVH, Gr1DD, dil asc ao & LA);  If he is to improve his condition going forward he will definitely have to lose weight & in my opinion Bariatric surg (poss gastric sleeve) is his only hope, although we reviewed DIET/ EXERCISE/ Wt reduction  strategies... PLAN>>  Extensive w/u in progress-- we will start MetoprololER100 for LVH & AscAo at 38mm;  Start Atorva40 for Lipids;  Refill his Meloxicam15 prn arthritis pain;  But he understands that substantial improvement requires him to lose wt and inasmuch as he has been totally unable to lose wt x many yrs=> needs to consider Bariatric option as a life saving procedure!  Asked to call [facility] to start the process and learn more about what they can do for him... 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 ~  3/18>  Pt indicates that he hasn't smoked in ~14yr...  HYPERTENSION (ICD-401.9) - prev on Metoprolol & Accupril but he stopped these on his own when he got serious w/ diet & exercise...  ~  8/10:  weight is down 55# to 299# in 2010 and BP= 130/90 which was better... he is reluctant to restart meds, so we decided on home BP monitoring w/ goal <140/85... ~  10/11:  weight back up to 336# & BP= 142/92, but he doesn't want to restart meds stating that he knows he can improve BP by getting hias weight down w/ diet + exercise... ~  4/13:  Weight up further to 355# & BP= 152/84; denies CP, palpit, SOB, edema; desperately needs to lose the weight or start back on BP meds for cardioprotection. ~  10/13:  BP= 138/82 and he denies symptoms; weight remains elev at 358# & we reviewed the importance of weight reduction... ~  6/14:  on ASA325 & low sodium diet, no other BP meds; he knows the importance of wt reduction; BP= 140/98 & we will add the Lasix20, low sodium, wt reduction=> told he will need additional meds if he doesn't get wt down! ~  3/15:  on ASA325 & low sodium diet, prn Lasix20, no other BP meds; he knows the importance of wt reduction; BP=  130/84 & he denies HA, CP, palpit, SOB, edema... ~  3/18>  Pt weighs 410#, BP=138/74 but 2DEcho shows mod LVH, dil AscAo at 110mm and LAE at 99mm; Rec to start METOPROLO-ER100 daily...  VENOUS INSUFFICIENCY (ICD-459.81) - he has chr ven inuffic & edema... he knows to elim sodium, elevate legs, wear support hose... prev on Lasix20  but off all meds during the last yr or so... ~  6/14:  he knows to avoid sodium, elev legs, wear support hose; he requests Lasix20 prn edema- ok...   HYPERCHOLESTEROLEMIA (ICD-272.0) - prev on LIPITOR 20mg /d + diet efforts... ~  [place] 7/99 on diet showed TChol 256, TG 342, HDL 47, LDL 141... rec> start [place]. ~  [place] 3/04 on Lip10 showed TChol 156, TG 149, HDL 42, LDL 85 ~  FLP 8/07 on Lip10 showed TChol 158, TG 133, HDL 43, LDL 89 ~  FLP 8/10 on diet showed TChol 213, TG 164,  HDL 51, LDL 139... he wants to continue diet Rx. ~  FLP 10/11 on diet showed TChol 206, TG 111, HDL 46, LDL 139... rec> start LIPITOR 20mg /d. ~  FLP 4/13 on Lip20 showed TChol 164, TG 127, HDL 51, LDL 87 ~  FLP 6/14 off Lip20 now showed TChol 240, TG 226, HDL 51, LDL162  ~  FLP 3/15 on diet alone showed TChol 242, TG 161, HDL 50, LDL160  ~  FLP 3/18 on diet alone shows TChol 263, TG 215, HDL 51, LDL 181 => discussed trial Atorva40 w/ f/u labs in several months...  OBESITY (ICD-278.00) - best weight per old chart = 277# in February 02, 1989, and worst weight = 355# in 2006/02/02... we have had numerous conversations about diet + exercise strategies for weight reduction... 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 ~  he had FlexSig 1991 by DrPatterson w/ int hems and Rx w/ Anusol... 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 f/u planned 12yrs. ~  He is sched to have his f/u colonoscopy 12/13 per DrPatterson=> he has not had this done yet. ~  He cancelled 2014-02-02 appt w/ DrGessner... ~  3/18>  He needs OV w/ GI to review prev hx & sched his f/u colonoscopy...  Hx of FATTY LIVER DISEASE (ICD-571.8) - hx elevated LFT's in the 90's believed related to hepatic steatosis w/ eval by DrPatterson in Feb 02, 1990... he drinks beer on weekends & advised to quit... LFT's have been normal in recent yrs. ~  4/13:  LFTs remain WNL despite weight gain to 355# ~  6/14:  LFTs remain WNL despite wt gain to 365# ~  3/15:  LFTs remain wnl despite his weight of 374# ~  3/18>  Fortunately his LFTs remain wnl despite wt gain to 410#  DEGENERATIVE JOINT DISEASE (ICD-715.90) - he uses OTC analgesics as needed... ~  s/p right knee arthroscopy 2/05 by DrGioffre for meniscus tear... ~  s/p left knee arthroscopy 8/13 by drNorris for torn meniscus... ~  He says he was old that his knees are bone-on-bone...  ANXIETY (ICD-300.00) - pt's wife died 02/02/2002 from breast cancer- 2 daughters & one has grad from [employer]...  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   Past Surgical History:  Procedure Laterality Date  . COLONOSCOPY W/ POLYPECTOMY  06/27/2009  . FLEXIBLE SIGMOIDOSCOPY    . left knee arthroscopy  05/24/2012   torn meniscus   . right knee arthroscopy  11/2003   Dr. Gladstone Lighter    Outpatient Encounter Prescriptions as of 12/31/2016  Medication Sig Dispense Refill  . aspirin 325 MG tablet Take 325 mg by mouth daily.    Marland Kitchen CIALIS 20 MG tablet TAKE 1 TABLET DAILY AS NEEDED FOR ERECTILE DYSFUNCTION 90 tablet 1  . furosemide (LASIX) 20 MG tablet TAKE 1 TABLET DAILY FOR SWELLING THAT DOESN'T GO DOWN OVERNIGHT (Patient not taking: Reported on  12/31/2016) 90 tablet 2  . meloxicam (MOBIC) 15 MG tablet TAKE 1 TABLET DAILY AS NEEDED (Patient not taking: Reported on 12/31/2016) 90 tablet 0  . omeprazole (PRILOSEC OTC) 20 MG tablet Take 20 mg by mouth daily. 30 minutes before a meal    . rosuvastatin (CRESTOR) 10 MG tablet Take 1 tablet (10 mg total) by mouth daily. (Patient not taking: Reported on 12/31/2016) 90 tablet 3   No facility-administered encounter medications on file as of 12/31/2016.     No Known Allergies    Immunization History  Administered Date(s) Administered  . Influenza Split 08/17/2012  . Influenza Whole 08/19/2010    No family history on file.    Social History   Social History  . Marital status: Widowed    Spouse name: N/A  . Number of children: 2  . Years of education: N/A   Occupational History  . Not on file.   Social History Main Topics  . Smoking status: Former Smoker    Packs/day: 1.00    Years: 27.00    Types: Cigarettes    Quit date: 10/20/2009  . Smokeless tobacco: Never Used     Comment: 2 packs per week  . Alcohol use No     Comment: 6-10 beers per week--stopped drinking in the last 2 years  . Drug use: Unknown  . Sexual activity: Not on file   Other Topics Concern  . Not on file   Social History Narrative  . No narrative on file    Current Medications, Allergies, Past Medical History, Past Surgical History, Family History, and Social History were reviewed in [employer] record.   Review of Systems       The patient denies fever, chills, sweats, anorexia, fatigue, weakness, malaise, weight loss, sleep disorder, blurring, diplopia, eye irritation, eye discharge, vision loss, eye pain, photophobia, earache, ear discharge, tinnitus, decreased hearing, nasal congestion, nosebleeds, sore throat, hoarseness, chest pain, palpitations, syncope, dyspnea on exertion, orthopnea, PND, peripheral edema, cough, dyspnea at rest, excessive sputum, hemoptysis, wheezing,  pleurisy, nausea, vomiting, diarrhea, constipation, change in bowel habits, abdominal pain, melena, hematochezia, jaundice, gas/bloating, indigestion/heartburn, dysphagia, odynophagia, dysuria, hematuria, urinary frequency, urinary hesitancy, nocturia, incontinence, back pain, joint pain, joint swelling, muscle cramps, muscle weakness, stiffness, arthritis, sciatica, restless legs, leg pain at night, leg pain with exertion, itching, dryness, suspicious lesions, paralysis, paresthesias, seizures, tremors, vertigo, transient blindness, frequent falls, frequent headaches, difficulty walking, depression, anxiety, memory loss, confusion, cold intolerance, heat intolerance, polydipsia, polyphagia, polyuria, unusual weight change, abnormal bruising, bleeding, enlarged lymph nodes, urticaria, allergic rash, hay fever, and recurrent infections.     Objective:   Physical Exam     WD, Obese, 61 y/o WM in NAD... GENERAL:  Alert & oriented; pleasant & cooperative... HEENT:  [place]/AT, EOM-wnl, PERRLA, Fundi-benign, EACs-clear, TMs-wnl, NOSE-clear, THROAT-clear & wnl. NECK:  Supple w/ full ROM; no JVD; normal carotid impulses w/o bruits; no thyromegaly or nodules palpated; no lymphadenopathy. CHEST:  Clear to P & A; without wheezes/ rales/ or rhonchi. HEART:  Regular Rhythm; without murmurs/ rubs/ or gallops. ABDOMEN:  Obese, soft & nontender; normal bowel sounds; no organomegaly or masses detected. (RECTAL:  +rectal stricture, tender, can't reach prostate, stool heme neg) EXT: without deformities or arthritic changes; no varicose veins/ venous insuffic/ or edema. NEURO:  CN's intact; motor testing normal; sensory testing normal; gait normal & balance OK. DERM:  No lesions noted; no rash etc...  RADIOLOGY DATA:  Reviewed in the EPIC EMR & discussed  w/ the patient...  LABORATORY DATA:  Reviewed in the EPIC EMR & discussed w/ the patient...   Assessment:      MORBID OBESITY>> wt up to 410 lbs 12/2016 & now  assoc w/ DOE => we reviewed the imperative for weight reduction & I have again rec that he consider Bariatric surg as a life saving procedure for him...  R/O OSA w/ Sleep test pending...  HBP>  Borderline BP on "diet" alone; reviewed need for no salt & weight reduction=> 2DEcho 3/18 shows mod LVH, dilated AscAo at 28mm & Gr1DD => added Metoprolol100/d...  Ven Insuffic>  He knows to elim sodium, elev legs, wear support hose, etc...  CHOL>  Prev on Lip20 but he stopped on his own due to knee pain; FLP was way off & he wanted to try Cres10 but never filled the Rx;  12/2016 rec Atorva40 to start now..  GI> Divertics, Polyps, Hems, rectal stricture>  He uses OTC Prilosec prn; he has rectal stricture & needs to maintain soft stool due to narrow caliber; neg for blood but due for colonoscopy per DrPatterson soon...  Fatty Liver Dis>  LFTs remain normal despite weight gain...  DJD>  Aware- followed by Ortho on Mobic & we discussed "wear & tear" joints, his weight, etc...     Plan:     We did not fill his prev meds => hasn't taken in >43yrs, just on ASA325 daily...  REC> METOPROLOL-ER 100 daily ATORVASTATIN 40 daily MELOXICAM 15mg  daily as needed for arthritis pain

## 2017-01-02 ENCOUNTER — Other Ambulatory Visit: Payer: Managed Care, Other (non HMO)

## 2017-01-20 ENCOUNTER — Ambulatory Visit (HOSPITAL_COMMUNITY): Payer: Managed Care, Other (non HMO) | Attending: Cardiology

## 2017-01-20 ENCOUNTER — Other Ambulatory Visit: Payer: Self-pay

## 2017-01-20 DIAGNOSIS — Z6841 Body Mass Index (BMI) 40.0 and over, adult: Secondary | ICD-10-CM | POA: Diagnosis not present

## 2017-01-20 DIAGNOSIS — Z87891 Personal history of nicotine dependence: Secondary | ICD-10-CM | POA: Insufficient documentation

## 2017-01-20 DIAGNOSIS — E785 Hyperlipidemia, unspecified: Secondary | ICD-10-CM | POA: Diagnosis not present

## 2017-01-20 DIAGNOSIS — R030 Elevated blood-pressure reading, without diagnosis of hypertension: Secondary | ICD-10-CM | POA: Diagnosis not present

## 2017-01-20 DIAGNOSIS — R9431 Abnormal electrocardiogram [ECG] [EKG]: Secondary | ICD-10-CM | POA: Diagnosis not present

## 2017-01-21 ENCOUNTER — Encounter: Payer: Self-pay | Admitting: Pulmonary Disease

## 2017-01-22 ENCOUNTER — Other Ambulatory Visit: Payer: Self-pay | Admitting: Pulmonary Disease

## 2017-01-22 DIAGNOSIS — R609 Edema, unspecified: Secondary | ICD-10-CM

## 2017-01-29 ENCOUNTER — Other Ambulatory Visit: Payer: Self-pay | Admitting: Pulmonary Disease

## 2017-01-29 ENCOUNTER — Telehealth: Payer: Self-pay | Admitting: Pulmonary Disease

## 2017-01-29 DIAGNOSIS — R0609 Other forms of dyspnea: Principal | ICD-10-CM

## 2017-01-29 MED ORDER — METOPROLOL SUCCINATE ER 100 MG PO TB24: 100.0000 mg | ORAL_TABLET | Freq: Every day | ORAL | 4 refills | Status: DC

## 2017-01-29 MED ORDER — MELOXICAM 15 MG PO TABS: 15.0000 mg | ORAL_TABLET | Freq: Every day | ORAL | 4 refills | Status: DC

## 2017-01-29 MED ORDER — ATORVASTATIN CALCIUM 40 MG PO TABS: 40.0000 mg | ORAL_TABLET | Freq: Every day | ORAL | 4 refills | Status: DC

## 2017-01-29 NOTE — Telephone Encounter (Signed)
Refills of the atorvastatin, metoprolol and mobic have been faxed to the pharmacy.  I called the number listed, but there was no PA pending for this pt.  Unsure of what the pharmacy needed, but it took me over 10 mins to get on the phone with an actual person with [facility] to speak to them.  Nothing further is needed.

## 2017-02-04 ENCOUNTER — Encounter: Payer: Self-pay | Admitting: Pulmonary Disease

## 2017-02-04 ENCOUNTER — Other Ambulatory Visit: Payer: Self-pay | Admitting: Pulmonary Disease

## 2017-02-04 ENCOUNTER — Ambulatory Visit (INDEPENDENT_AMBULATORY_CARE_PROVIDER_SITE_OTHER): Payer: Managed Care, Other (non HMO) | Admitting: Pulmonary Disease

## 2017-02-04 ENCOUNTER — Other Ambulatory Visit (INDEPENDENT_AMBULATORY_CARE_PROVIDER_SITE_OTHER): Payer: Managed Care, Other (non HMO)

## 2017-02-04 VITALS — BP 122/68 | HR 94 | Temp 96.8°F | Ht 70.0 in | Wt >= 6400 oz

## 2017-02-04 DIAGNOSIS — R7989 Other specified abnormal findings of blood chemistry: Secondary | ICD-10-CM

## 2017-02-04 DIAGNOSIS — I872 Venous insufficiency (chronic) (peripheral): Secondary | ICD-10-CM | POA: Diagnosis not present

## 2017-02-04 DIAGNOSIS — M159 Polyosteoarthritis, unspecified: Secondary | ICD-10-CM

## 2017-02-04 DIAGNOSIS — E291 Testicular hypofunction: Secondary | ICD-10-CM

## 2017-02-04 DIAGNOSIS — M150 Primary generalized (osteo)arthritis: Secondary | ICD-10-CM

## 2017-02-04 DIAGNOSIS — R030 Elevated blood-pressure reading, without diagnosis of hypertension: Secondary | ICD-10-CM | POA: Diagnosis not present

## 2017-02-04 DIAGNOSIS — R609 Edema, unspecified: Secondary | ICD-10-CM

## 2017-02-04 DIAGNOSIS — R5383 Other fatigue: Secondary | ICD-10-CM

## 2017-02-04 DIAGNOSIS — E7800 Pure hypercholesterolemia, unspecified: Secondary | ICD-10-CM | POA: Diagnosis not present

## 2017-02-04 DIAGNOSIS — G4733 Obstructive sleep apnea (adult) (pediatric): Secondary | ICD-10-CM | POA: Diagnosis not present

## 2017-02-04 DIAGNOSIS — R0609 Other forms of dyspnea: Secondary | ICD-10-CM

## 2017-02-04 LAB — TESTOSTERONE: Testosterone: 213.9 ng/dL — ABNORMAL LOW (ref 300.00–890.00)

## 2017-02-04 NOTE — Patient Instructions (Signed)
Today we updated your med list in our EPIC system...    Continue your current medications the same...  Today we checked your Spirometry breathing test & this confirmed a component of RESTRICTIVE lung disease...     Increased exercise & WEIGHT REDUCTION are the keys to improving this issue...  Today we rechecked your Testosterone level & ran the LYME serology...    We will contact you w/ the results when available...     If confirmed we will start you on a topical Testosterone product & recheck the blood level in about 73mo via our lab...     We can anticipate problems w/ your insurance company & this medication => please get a copy of their [place] so we can get the best & least expensive medication for your needs...  We have arranged for the HOME SLEEP TEST as well...    We will contact you w/ the results when available...   Call for any questions...  Let's plan a follow up visit in about 43mo, sooner if needed for problems.Marland KitchenMarland Kitchen

## 2017-02-04 NOTE — Progress Notes (Signed)
Subjective:     Patient ID: Ryan [name], male   DOB: [birth year], 61 y.o.   MRN: [id number]  HPI 61 y/o WM here for a follow up ex-smoker, multifactorial dyspnea including mild restriction, OSA & morbid obesity-- see prob list below>>   ~  February 14, 2012:  33mo ROV & CPX (age56)> Rand's CC is his knees, DJD & he is seeing an Orthopedist on [facility] 15mg /d as needed;  Unfortunately his weight is up a further 19# to 355# now in his 5'10" frame for a BMI=51; we discussed bariatric options but he is not interested, states he will "start" diet & continue swimming at the [place] per week...  We reviewed prob list, meds, xrays and labs> see below>>  CXR 4/13 showed norm heart size, clear lungs, NAD...  LABS 4/13:  FLP- at goals on Lip20;  Chems- wnl;  CBC- wnl;  TSH=1.90;  PSA=0.15;  UA=clear...  ~  August 17, 2012:  53mo ROV & Ryan [name] reports a good interval> he had left knee arthroscopic surg 8/13 by DrNorris for torn meniscus; did well w/ surg & post op rehab; exercise by swimming but unfortunately he has not lost any weight & we reviewed DIET/ EXERCISE/ weight reduction strategies...     He still [place] a few[place]- and denies cough, sputum, SOB, etc;  BP remains OK on diet alone (low sodium) and measures 138/82 today w/o CP, palpit, SOB, edema, etc;  Similarly his Chol is OK on Lip20...    We reviewed prob list, meds, xrays and labs> see below for updates >> OK 2013 Flu vaccine today...  ~  April 13, 2013:  44mo ROV & Ryan [name] informs me that he is not taking his Lip20 as he feels it has caused knee pain (improved off this med); he wants to try Cres10- ok & f/u FLP on the Crestor later... We reviewed the following medical problems during today's office visit >>     Cig smoker> not smoking regularly, "only in casinos" & we discussed smoking cessation, avail meds etc...    HBP> on ASA325 & low sodium diet, no other BP meds; he knows the importance of wt reduction; BP= 140/98 & we will  add the Lasix20, low sodium, wt reduction=> told he will need additional meds if he doesn't get wt down!    Ven Insuffic> he knows to avoid sodium, elev legs, wear support hose; he requests Lasix20 prn edema- now asked to take one daily for this & BP.    CHOL> prev on Lip20- he stopped on his own due to knee pain (better off this med he says); FLP 6/14 on diet alone shows TChol 240, TG 226, HDL 51, LDL162; he wants to try Cres10-ok...    Obesity> weight is up to 365# w/ assoc incr BP, lipids, etc; everything depends on weight reduction; we reviewed diet, exercise, wt reduction strategies; offered dietary consult etc; he refuses to consider bariatric surg...    GI- Divertics, Polyps, Hems w/ rectal stenosis> on Prilosec20 prn; he is overdue for f/u colonoscopy- "They couldn't do it due to my weight >350#" but surely it can be done at the hosp & we will call GI to set this up...    NAFLD> he had Hep steatosis diagnosed in the 1990s by DrPatterson; fortunately his LFTs have been ok esp since he hasn't been able to lose any weight; labs 6/14 showed LFTs normal...    GU> on Cialis20 as needed.Marland KitchenMarland Kitchen  DJD> on Mobic15 prn; he has had bilat knee arthroscopies but when his knees started hurting he thought it was the Lip20 & stopped it...    Anxiety> see below- no requiring anxiolytic meds... We reviewed prob list, meds, xrays and labs> see below for updates >>   LABS 6/14:  FLP- not at goals off statin rx;  Chems- wnl;  CBC- wnl;  TSH=2.02;  PSA=0.20...  ~  January 11, 2014:  20mo ROV & unfortunately Ryan [name] has gained 10# further up to 374# & he understands the critically important need to lose the weight & get healthier;  He has no new complaints or concerns;  We reviewed the following medical problems during today's office visit >>     Cig smoker> not smoking regularly, "only in casinos" & we discussed smoking cessation, avail meds etc; not requiring breathing meds...    HBP> on DVV616 & low sodium diet, prn  Lasix20, no other BP meds; he knows the importance of wt reduction; BP= 130/84 & he denies HA, CP, palpit, SOB, edema...    Ven Insuffic> he knows to avoid sodium, elev legs, wear support hose; he requests Lasix20 prn edema- ok...    CHOL> prev on Lip20- he stopped on his own due to knee pain (better off this med he says); FLP 3/15 on diet alone shows TChol 242, TG 161, HDL 50, LDL160; he never started the Cres10=> do it NOW & recheck labs 27mo.    Obesity> weight is up to 374# w/ assoc incr BP, lipids, etc; everything depends on weight reduction; we reviewed diet, exercise, wt reduction strategies; offered dietary consult etc & he refused to consider Bariatric surg....    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    NAFLD> he had Hep steatosis diagnosed in the 1990s by DrPatterson; fortunately his LFTs have been ok esp since he hasn't been able to lose any weight; labs 3/15 showed LFTs normal...    GU> on Cialis20 as needed...     DJD> on Mobic15 prn; he has had bilat knee arthroscopies but when his knees started hurting he thought it was the Lip20 & stopped it...    Anxiety> see below- no requiring anxiolytic meds... We reviewed prob list, meds, xrays and labs> see below for updates >>   CXR 3/15> he forgot to go to [facility] for the needed film...  EKG 3/15 showed NSR, rate88, one PVC, otherw wnl...  LABS 3/15:  FLP- not at goals on diet alone, rec to start Cres10 & recheck 73mo;  Chems- wnl;  CBC- wnl;  TSH=2.25;  PSA=0.18...   ~  December 31, 2016:  74yr [name] disappeared after his 12/2013 CPX- not a single EPIC entry in the last 44yrs!  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 We updated his problem list as follows>>     R/O OSA> now up to >400 lbs, he wakes ~4am tired, no prob driving, naps 7/7 in afternoon, to bed by 8pm for 8H sleep, wakes x2 to urinate & feels that he sleeps well, denies snoring & sleeps on his side (wife passed away yrs ago).    Ex-Cig smoker> prev "only in casinos" & he says none for ~33yr now; we discussed importance of smoking cessation; he is not requiring breathing meds so far...    Borderline HBP> on ASA325 & low sodium diet, not on BP meds; he knows the importance of sodium restriction & wt reduction; BP= 138/74 & he denies HA, CP, palpit, dizzy, ch in edema...    Ven Insuffic> he knows to avoid sodium, elev legs, wear support hose; prev treated w/ Lasix20 prn edema...    CHOL> prev on Lip- he stopped on his own due to knee pain (better off this med he says); FLP 3/15 on diet alone shows TChol 242, TG 161, HDL 50, LDL160 and rec to take Cres10 (never did); Labs 3/18 showed TChol 263, TG 215, HDL 51, LDL 181 => discussed trial Atorva40 w/ f/u labs in several months...    Obesity> weight is up to 410# w/ assoc incr BP, lipids, etc; everything depends on weight reduction; we reviewed diet, exercise, wt reduction strategies; offered dietary consult etc & we discussed the option/ need for Bariatric surg.    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     NAFLD> he had Hep steatosis diagnosed in the 1990s by DrPatterson; fortunately his LFTs have been ok esp since he hasn't been able to lose any weight; labs 3/18 showed LFTs remain normal...    GU> prev on Cialis20 as needed for ED but he has symptoms of Low-T w/ decr libido, weakness, etc; Labs 3/18 showed Testos level = 174 (300-890) & we will recheck level & start topical product per protocol...    DJD> on Mobic15 prn; he has had bilat knee arthroscopies but when his knees started hurting he thought it was the Lip20 & stopped it, he's been told it's bone-on-bone..    Anxiety> see below- no requiring anxiolytic meds... EXAM shows Afeb, VSS w/ BP=138/74, P=104/reg, Wt=410#, 5'10"Tall, BMI=59; HEENT- neg, mallampati3; Chest- clear w/o w/r/r;  Heart- RR w/o m/r/g; Abd- obese w/ panniculus, soft nontender; Ext- VI 1+edema w/o c/c; Neuro- ok.   CXR 12/31/16 (independently reviewed by me in the PACS system) showed heart at upper lim of norm size, mild bronch wall thickening & bilat pleural thickening, otherw clear, NAD.Marland KitchenMarland Kitchen  EKG 12/31/16 showed STachy, rate 102, late transition, no acute changes...  Ambulatory Oximetry 12/31/16> O2sat=96% on RA at rest w/ pulse=106/min;  He ambulated 3 laps (185'ea) w/ lowest O2sat=94% w/ pulse=132/min;  We were unable to do Spirometry today- machine down.  LABS 12/31/16> FLP- not at goals on diet alone; Chems- ok x BS=108 (Cr=0.93 & LFTs wnl); CBC- wnl x WBC=12.0 w/ norm diff; TSH=4.10; PSA=0.20; Testos=174 (300-890)  2DEcho 01/20/17 showed mod conc LVH, norm LVF w/ EF=55-60% & no RWMA, Gr1DD, mildly dil AscAo at 109mm, norm AoV, MV-ok x triv MR, LA mod  dil at 35mm, RV & PAsys are wnl... IMP>>  His DYSPNEA is clearly multifactorial w/ components from morbid obesity, deconditioning, prob pulm restriction, ex-smoker, r/o OSA, r/o cardiac problems (LVH, Gr1DD, dil asc ao & LA);  If he is to improve his condition going forward he will definitely have to lose weight & in my opinion  Bariatric surg (poss gastric sleeve) is his only hope, although we reviewed DIET/ EXERCISE/ Wt reduction strategies... PLAN>>  Extensive w/u in progress-- we will start MetoprololER100 for LVH & AscAo at 75mm;  Start Atorva40 for Lipids;  Refill his Meloxicam15 prn arthritis pain;  But he understands that substantial improvement requires him to lose wt and inasmuch as he has been totally unable to lose wt x many yrs=> needs to consider Bariatric option as a life saving procedure!  Asked to call [facility] to start the process and learn more about what they can do for him... Note: sleep study, spirometry, lyme titers are pending   ~  February 04, 2017:  76mo ROV & Ryan [name] returns to review the above data and requests that we check LYME titer, he is also due for the 2nd Testos level to meet his insur company requirement for testos replacement therapy;  He has morbid obesity (wt down 3# to 407# today) but he has not called [facility] to inquire about their bariatric program;  His insurance company cancelled his in-lab PSA in favor of a Home Sleep Test=> done 02/04/17 w/ severe OSA being found=> now he needs an in-lab CPAP titration test  Due to nocturnal hypoxemia;  He has borderline HBP & mod concentric LVH & mildly dil asc ao at 33mm-- started  ASA325, MetoprololER100, & Lasix20 as needed for swelling that doesn't go down overnight;  He has hyperlipidemia w/ recent TChol=263, LDL=181-- rec to start Atorva40 + diet;  Finally he also has prescriptions for Prilosec20, Mobic15, and Cialis20    EXAM shows Afeb, VSS w/ BP=122/68, P=94/reg, Wt=407#, 5'10"Tall, BMI=59; HEENT- neg, mallampati3; Chest- clear w/o w/r/r;  Heart- RR w/o m/r/g; Abd- obese w/ panniculus, soft nontender; Ext- VI 1+edema w/o c/c; Neuro- ok.   Spirometry 02/04/17>  FVC=4.21 (87%), FEV1=3.46 (94%), %1sec=82%, mid-flows=119% predicted...   LABS 02/04/17>  Testos (2nd check required by [employer])=214 (300-890);  Lyme test (B.burgdorfi antibodies) =NEG  ((<0.90)  HOME SLEEP STUDY 02/04/17> c/w severe OSA, AHI=57.6/hr of sleep, plus O2desat to 66% & ave=89%, he needs to proceed to CPAP titration for Dx of severe OSA...  IMP/PLAN>>  I discussed w/ Ryan [name] the imperative to lose weight over the long term & the need for CPAP to treat his OSA & nocturnal hypoxemia=> proceed w/ in-lab CPAP titration test & mask fit session;  In the interim we have initiated rx w/ ASA, B-blocker, statin, & he should qualify for topical testos replacement therapy...           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 ~  3/18>  Pt indicates that he hasn't smoked in ~34yr...  HYPERTENSION (ICD-401.9) - prev on Metoprolol & Accupril but he stopped these on his own when he got serious w/ diet & exercise...  ~  8/10:  weight is down 55# to 299# in 2010 and BP= 130/90 which was better... he is reluctant to restart meds, so we decided on  home BP monitoring w/ goal <140/85... ~  10/11:  weight back up to 336# & BP= 142/92, but he doesn't want to restart meds stating that he knows he can improve BP by getting hias weight down w/ diet + exercise... ~  4/13:  Weight up further to 355# & BP= 152/84; denies CP, palpit, SOB, edema; desperately needs to lose the weight or start back on BP meds for cardioprotection. ~  10/13:  BP= 138/82 and he denies symptoms; weight remains elev at 358# & we reviewed the importance of weight reduction... ~  6/14:  on ASA325 & low sodium diet, no other BP meds; he knows the importance of wt reduction; BP= 140/98 & we will add the Lasix20, low sodium, wt reduction=> told he will need additional meds if he doesn't get wt down! ~  3/15:  on ASA325 & low sodium diet, prn Lasix20, no other BP meds; he knows the importance of wt reduction; BP= 130/84 & he denies HA, CP,  palpit, SOB, edema... ~  3/18>  Pt weighs 410#, BP=138/74 but 2DEcho shows mod LVH, dil AscAo at 6mm and LAE at 12mm; Rec to start METOPROLO-ER100 daily...  VENOUS INSUFFICIENCY (ICD-459.81) - he has chr ven inuffic & edema... he knows to elim sodium, elevate legs, wear support hose... prev on Lasix20  but off all meds during the last yr or so... ~  6/14:  he knows to avoid sodium, elev legs, wear support hose; he requests Lasix20 prn edema- ok...   HYPERCHOLESTEROLEMIA (ICD-272.0) - prev on LIPITOR 20mg /d + diet efforts... ~  [place] 7/99 on diet showed TChol 256, TG 342, HDL 47, LDL 141... rec> start [place]. ~  [place] 3/04 on Lip10 showed TChol 156, TG 149, HDL 42, LDL 85 ~  FLP 8/07 on Lip10 showed TChol 158, TG 133, HDL 43, LDL 89 ~  FLP 8/10 on diet showed TChol 213, TG 164, HDL 51, LDL 139... he wants to continue diet Rx. ~  FLP 10/11 on diet showed TChol 206, TG 111, HDL 46, LDL 139... rec> start LIPITOR 20mg /d. ~  FLP 4/13 on Lip20 showed TChol 164, TG 127, HDL 51, LDL 87 ~  FLP 6/14 off Lip20 now showed TChol 240, TG 226, HDL 51, LDL162  ~  FLP 3/15 on diet alone showed TChol 242, TG 161, HDL 50, LDL160  ~  FLP 3/18 on diet alone shows TChol 263, TG 215, HDL 51, LDL 181 => discussed trial Atorva40 w/ f/u labs in several months...  OBESITY (ICD-278.00) - best weight per old chart = 277# in 1990, and worst weight = 355# in 2007... we have had numerous conversations about diet + exercise strategies for weight reduction... 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 ~  he had FlexSig  1991 by DrPatterson w/ int hems and Rx w/ Anusol... 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 f/u planned 75yrs. ~  He is sched to have his f/u colonoscopy 12/13 per DrPatterson=> he has not had this done yet. ~  He cancelled 12-Feb-2014 appt w/ DrGessner... ~  3/18>  He needs OV w/ GI to review prev hx & sched his f/u colonoscopy...  Hx of FATTY LIVER DISEASE (ICD-571.8) - hx elevated LFT's in the 90's believed related to hepatic steatosis w/ eval by DrPatterson in 1990/02/12... he drinks beer on weekends & advised to quit... LFT's have been normal in recent yrs. ~  4/13:  LFTs remain WNL despite weight gain to 355# ~  6/14:  LFTs remain WNL despite wt gain to 365# ~  3/15:  LFTs remain wnl despite his weight of 374# ~  3/18>  Fortunately his LFTs remain wnl despite wt gain to 410#  DEGENERATIVE JOINT DISEASE (ICD-715.90) - he uses OTC analgesics as needed... ~  s/p right knee arthroscopy 2/05 by DrGioffre for meniscus tear... ~  s/p left knee arthroscopy 8/13 by drNorris for torn meniscus... ~  He says he was old that his knees are bone-on-bone...  ANXIETY (ICD-300.00) - pt's wife died 02/12/2002 from breast cancer- 2 daughters & one has grad from [employer]...  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   Past Surgical History:  Procedure Laterality Date  . COLONOSCOPY W/ POLYPECTOMY  06/27/2009  . FLEXIBLE SIGMOIDOSCOPY    . left knee arthroscopy  05/24/2012   torn meniscus   . right knee arthroscopy  11/2003   Dr. Gladstone Lighter    Outpatient Encounter Prescriptions as of 02/04/2017  Medication Sig  . aspirin 325 MG tablet Take 325 mg by mouth daily.  Marland Kitchen atorvastatin (LIPITOR) 40 MG tablet Take 1 tablet (40 mg total) by mouth daily.  Marland Kitchen CIALIS 20 MG tablet TAKE 1 TABLET DAILY AS NEEDED FOR ERECTILE DYSFUNCTION  . furosemide (LASIX) 20 MG tablet TAKE 1 TABLET DAILY FOR SWELLING THAT DOESN'T GO DOWN OVERNIGHT  .  meloxicam (MOBIC) 15 MG tablet Take 1 tablet (15 mg total) by mouth daily.  . metoprolol succinate (TOPROL-XL) 100 MG 24 hr tablet Take 1 tablet (100 mg total) by mouth daily. Take with or immediately following a meal.  . omeprazole (PRILOSEC OTC) 20 MG tablet Take 20 mg by mouth daily. 30 minutes before a meal  . [DISCONTINUED] meloxicam (MOBIC) 15 MG tablet TAKE 1 TABLET DAILY AS NEEDED (Patient not taking: Reported on 12/31/2016)  . [DISCONTINUED] rosuvastatin (CRESTOR) 10 MG tablet Take 1 tablet (10 mg total) by mouth daily. (Patient not taking: Reported on 12/31/2016)   No facility-administered encounter medications on file as of 02/04/2017.     No Known Allergies    Immunization History  Administered Date(s) Administered  . Influenza Split 08/17/2012  . Influenza Whole 08/19/2010    No family history on file.    Social History   Social History  . Marital status: Widowed    Spouse name: N/A  . Number of children: 2  . Years of education: N/A   Occupational History  . Not on file.   Social History Main Topics  . Smoking status: Former Smoker    Packs/day: 1.00    Years: 27.00    Types: Cigarettes    Quit date: 10/20/2009  . Smokeless tobacco: Never Used     Comment: 2 packs per week  . Alcohol use No     Comment: 6-10 beers per week--stopped drinking in the last 2 years  . Drug use: Unknown  . Sexual activity: Not  on file   Other Topics Concern  . Not on file   Social History Narrative  . No narrative on file    Current Medications, Allergies, Past Medical History, Past Surgical History, Family History, and Social History were reviewed in [employer] record.   Review of Systems       The patient denies fever, chills, sweats, anorexia, fatigue, weakness, malaise, weight loss, sleep disorder, blurring, diplopia, eye irritation, eye discharge, vision loss, eye pain, photophobia, earache, ear discharge, tinnitus, decreased hearing, nasal  congestion, nosebleeds, sore throat, hoarseness, chest pain, palpitations, syncope, dyspnea on exertion, orthopnea, PND, peripheral edema, cough, dyspnea at rest, excessive sputum, hemoptysis, wheezing, pleurisy, nausea, vomiting, diarrhea, constipation, change in bowel habits, abdominal pain, melena, hematochezia, jaundice, gas/bloating, indigestion/heartburn, dysphagia, odynophagia, dysuria, hematuria, urinary frequency, urinary hesitancy, nocturia, incontinence, back pain, joint pain, joint swelling, muscle cramps, muscle weakness, stiffness, arthritis, sciatica, restless legs, leg pain at night, leg pain with exertion, itching, dryness, suspicious lesions, paralysis, paresthesias, seizures, tremors, vertigo, transient blindness, frequent falls, frequent headaches, difficulty walking, depression, anxiety, memory loss, confusion, cold intolerance, heat intolerance, polydipsia, polyphagia, polyuria, unusual weight change, abnormal bruising, bleeding, enlarged lymph nodes, urticaria, allergic rash, hay fever, and recurrent infections.     Objective:   Physical Exam     WD, Obese, 61 y/o WM in NAD... GENERAL:  Alert & oriented; pleasant & cooperative... HEENT:  [place]/AT, EOM-wnl, PERRLA, Fundi-benign, EACs-clear, TMs-wnl, NOSE-clear, THROAT-clear & wnl. NECK:  Supple w/ full ROM; no JVD; normal carotid impulses w/o bruits; no thyromegaly or nodules palpated; no lymphadenopathy. CHEST:  Clear to P & A; without wheezes/ rales/ or rhonchi. HEART:  Regular Rhythm; without murmurs/ rubs/ or gallops. ABDOMEN:  Obese, soft & nontender; normal bowel sounds; no organomegaly or masses detected. (RECTAL:  +rectal stricture, tender, can't reach prostate, stool heme neg) EXT: without deformities or arthritic changes; no varicose veins/ venous insuffic/ or edema. NEURO:  CN's intact; motor testing normal; sensory testing normal; gait normal & balance OK. DERM:  No lesions noted; no rash etc...  RADIOLOGY DATA:   Reviewed in the EPIC EMR & discussed w/ the patient...  LABORATORY DATA:  Reviewed in the EPIC EMR & discussed w/ the patient...   Assessment:      MORBID OBESITY>> wt up to 410 lbs 12/2016 & now assoc w/ DOE => we reviewed the imperative for weight reduction & I have again rec that he consider Bariatric surg as a life saving procedure for him... R/O OSA w/ Sleep test pending... 12/31/16>   His DYSPNEA is clearly multifactorial w/ components from morbid obesity, deconditioning, prob pulm restriction, ex-smoker, r/o OSA, r/o cardiac problems (LVH, Gr1DD, dil asc ao & LA);  If he is to improve his condition going forward he will definitely have to lose weight & in my opinion Bariatric surg (poss gastric sleeve) is his only hope, although we reviewed DIET/ EXERCISE/ Wt reduction strategies... PLAN>>  Extensive w/u in progress-- we will start MetoprololER100 for LVH & AscAo at 66mm;  Start Atorva40 for Lipids;  Refill his Meloxicam15 prn arthritis pain;  But he understands that substantial improvement requires him to lose wt and inasmuch as he has been totally unable to lose wt x many yrs=> needs to consider Bariatric option as a life saving procedure!  Asked to call [facility] to start the process and learn more about what they can do for him. 02/04/17>   I discussed w/ Ryan [name] the imperative to lose weight over the long  term & the need for CPAP to treat his OSA & nocturnal hypoxemia=> proceed w/ in-lab CPAP titration test & mask fit session;  In the interim we have initiated rx w/ ASA, B-blocker, statin, & he should qualify for topical testos replacement therapy...    HBP>  Borderline BP on "diet" alone; reviewed need for no salt & weight reduction=> 2DEcho 3/18 shows mod LVH, dilated AscAo at 24mm & Gr1DD => added Metoprolol100/d...  Ven Insuffic>  He knows to elim sodium, elev legs, wear support hose, etc...  CHOL>  Prev on Lip20 but he stopped on his own due to knee pain; FLP was way off & he wanted to try  Cres10 but never filled the Rx;  12/2016 rec Atorva40 to start now..  GI> Divertics, Polyps, Hems, rectal stricture>  He uses OTC Prilosec prn; he has rectal stricture & needs to maintain soft stool due to narrow caliber; neg for blood but due for colonoscopy per DrPatterson soon...  Fatty Liver Dis>  LFTs remain normal despite weight gain...  DJD>  Aware- followed by Ortho on Mobic & we discussed "wear & tear" joints, his weight, etc...     Plan:     Patient's Medications  New Prescriptions   No medications on file  Previous Medications   ASPIRIN 325 MG TABLET    Take 325 mg by mouth daily.   ATORVASTATIN (LIPITOR) 40 MG TABLET    Take 1 tablet (40 mg total) by mouth daily.   CIALIS 20 MG TABLET    TAKE 1 TABLET DAILY AS NEEDED FOR ERECTILE DYSFUNCTION   FUROSEMIDE (LASIX) 20 MG TABLET    TAKE 1 TABLET DAILY FOR SWELLING THAT DOESN'T GO DOWN OVERNIGHT   MELOXICAM (MOBIC) 15 MG TABLET    Take 1 tablet (15 mg total) by mouth daily.   METOPROLOL SUCCINATE (TOPROL-XL) 100 MG 24 HR TABLET    Take 1 tablet (100 mg total) by mouth daily. Take with or immediately following a meal.   OMEPRAZOLE (PRILOSEC OTC) 20 MG TABLET    Take 20 mg by mouth daily. 30 minutes before a meal  Modified Medications   No medications on file  Discontinued Medications   MELOXICAM (MOBIC) 15 MG TABLET    TAKE 1 TABLET DAILY AS NEEDED   ROSUVASTATIN (CRESTOR) 10 MG TABLET    Take 1 tablet (10 mg total) by mouth daily.

## 2017-02-05 LAB — LYME AB/WESTERN BLOT REFLEX: B burgdorferi Ab IgG+IgM: <0.9 Index (ref <0.90)

## 2017-02-06 DIAGNOSIS — G4733 Obstructive sleep apnea (adult) (pediatric): Secondary | ICD-10-CM | POA: Diagnosis not present

## 2017-02-09 ENCOUNTER — Other Ambulatory Visit: Payer: Self-pay | Admitting: Pulmonary Disease

## 2017-02-09 ENCOUNTER — Other Ambulatory Visit: Payer: Self-pay | Admitting: *Deleted

## 2017-02-09 DIAGNOSIS — G4733 Obstructive sleep apnea (adult) (pediatric): Secondary | ICD-10-CM

## 2017-02-09 DIAGNOSIS — R5383 Other fatigue: Secondary | ICD-10-CM

## 2017-02-09 DIAGNOSIS — R0609 Other forms of dyspnea: Principal | ICD-10-CM

## 2017-02-09 MED ORDER — TESTOSTERONE 20.25 MG/ACT (1.62%) TD GEL: TRANSDERMAL | 5 refills | Status: DC

## 2017-02-11 ENCOUNTER — Telehealth: Payer: Self-pay | Admitting: Pulmonary Disease

## 2017-02-11 NOTE — Telephone Encounter (Signed)
Spoke with the pt  He states needing PA for Androgel  I asked if he received his drug formulary as Dr Lenna Gilford requested that he do  He states that he tried to get his formulary and his ins company advised that "they don't do that"   Called [facility] and was given number to call for PA- [phone]  Pt ID number is [id number]   I was on hold for 8.5 minutes without speaking with anyone and due to high volume in triage WCB later

## 2017-02-13 NOTE — Telephone Encounter (Signed)
Spoke with Jonelle Sidle and she will complete this PA.  thanks

## 2017-02-13 NOTE — Telephone Encounter (Signed)
Started PA over the phone with insurance company for Androgel 1.62%. Approved 01/14/2017 until 02/13/2018 PA# [id number]  The CSR said the pt would receive a phone call letting pt know the medication was approved.

## 2017-02-17 ENCOUNTER — Telehealth: Payer: Self-pay | Admitting: Pulmonary Disease

## 2017-02-17 ENCOUNTER — Encounter (HOSPITAL_BASED_OUTPATIENT_CLINIC_OR_DEPARTMENT_OTHER): Payer: Managed Care, Other (non HMO)

## 2017-02-17 NOTE — Telephone Encounter (Signed)
Ryan [name]  Spoke with pt today and he stated he spoke with someone from [facility] and informed pt that the cost for the Androgel would cost him $200 and they suggested that he get the generic which would cost him $20. He states [facility] stated they faxed over the information of the generic. Have you received anything

## 2017-02-17 NOTE — Telephone Encounter (Signed)
lmtcb X1 for pt  

## 2017-02-18 NOTE — Telephone Encounter (Signed)
I have called to initiate the PA---[phone] They stated that the PA was already completed and approved from 3/28--4/19.  I called back to get the fax sent back over on the generic alternatives that would only cost the pt $20 every 3 months per the pts request.  He stated that the last rx that was sent in , he was going to have to pay $200.  Waiting on the fax

## 2017-02-24 MED ORDER — TESTOSTERONE 12.5 MG/ACT (1%) TD GEL: TRANSDERMAL | 3 refills | Status: DC

## 2017-02-24 NOTE — Addendum Note (Signed)
Addended by: Parke Poisson E on: 02/24/2017 03:18 PM   Modules accepted: Orders

## 2017-02-24 NOTE — Telephone Encounter (Signed)
Called and spoke with pt and he is aware of change in med.  This has been sent to the mail order pharmacy and he is aware.

## 2017-02-24 NOTE — Telephone Encounter (Signed)
Called and spoke with Andee Poles with the PA coverage department---she stated that the pt did pick up rx for the androgel on 4/27  In order for the pt to pay a lesser amount--will need to change the testosterone to the 1% and this will be the gel pump as well.  This has been added to the PA that was done and it has been approved through 02/13/18. SN please advise. Thanks

## 2017-02-24 NOTE — Telephone Encounter (Signed)
Rx printed Attempted to re-send electronically but it appears this medication can only be printed and faxed Rx signed by SN and taken to triage for fax

## 2017-04-03 ENCOUNTER — Encounter (HOSPITAL_BASED_OUTPATIENT_CLINIC_OR_DEPARTMENT_OTHER): Payer: Managed Care, Other (non HMO)

## 2017-07-06 ENCOUNTER — Ambulatory Visit: Payer: Managed Care, Other (non HMO) | Admitting: Pulmonary Disease

## 2017-07-15 ENCOUNTER — Ambulatory Visit: Payer: Managed Care, Other (non HMO) | Admitting: Pulmonary Disease

## 2017-08-11 ENCOUNTER — Ambulatory Visit (INDEPENDENT_AMBULATORY_CARE_PROVIDER_SITE_OTHER): Payer: Managed Care, Other (non HMO) | Admitting: Pulmonary Disease

## 2017-08-11 ENCOUNTER — Ambulatory Visit (INDEPENDENT_AMBULATORY_CARE_PROVIDER_SITE_OTHER): Payer: Managed Care, Other (non HMO)

## 2017-08-11 ENCOUNTER — Other Ambulatory Visit (INDEPENDENT_AMBULATORY_CARE_PROVIDER_SITE_OTHER): Payer: Managed Care, Other (non HMO)

## 2017-08-11 ENCOUNTER — Encounter: Payer: Self-pay | Admitting: Pulmonary Disease

## 2017-08-11 VITALS — BP 138/80 | HR 89 | Temp 97.6°F | Resp 18 | Ht 70.0 in | Wt >= 6400 oz

## 2017-08-11 DIAGNOSIS — M150 Primary generalized (osteo)arthritis: Secondary | ICD-10-CM

## 2017-08-11 DIAGNOSIS — G4733 Obstructive sleep apnea (adult) (pediatric): Secondary | ICD-10-CM

## 2017-08-11 DIAGNOSIS — R0609 Other forms of dyspnea: Secondary | ICD-10-CM

## 2017-08-11 DIAGNOSIS — E7800 Pure hypercholesterolemia, unspecified: Secondary | ICD-10-CM | POA: Diagnosis not present

## 2017-08-11 DIAGNOSIS — R609 Edema, unspecified: Secondary | ICD-10-CM

## 2017-08-11 DIAGNOSIS — M159 Polyosteoarthritis, unspecified: Secondary | ICD-10-CM

## 2017-08-11 DIAGNOSIS — I872 Venous insufficiency (chronic) (peripheral): Secondary | ICD-10-CM

## 2017-08-11 DIAGNOSIS — Z23 Encounter for immunization: Secondary | ICD-10-CM

## 2017-08-11 DIAGNOSIS — R030 Elevated blood-pressure reading, without diagnosis of hypertension: Secondary | ICD-10-CM

## 2017-08-11 DIAGNOSIS — E291 Testicular hypofunction: Secondary | ICD-10-CM | POA: Diagnosis not present

## 2017-08-11 LAB — LIPID PANEL
CHOL/HDL RATIO: 3
Cholesterol: 123 mg/dL (ref 0–200)
HDL: 41.8 mg/dL (ref >39.00)
LDL Cholesterol: 60 mg/dL (ref 0–99)
NONHDL: 81.06
Triglycerides: 105 mg/dL (ref 0.0–149.0)
VLDL: 21 mg/dL (ref 0.0–40.0)

## 2017-08-11 LAB — HEPATIC FUNCTION PANEL
ALBUMIN: 4.4 g/dL (ref 3.5–5.2)
ALK PHOS: 79 U/L (ref 39–117)
ALT: 24 U/L (ref 0–53)
AST: 19 U/L (ref 0–37)
BILIRUBIN TOTAL: 0.7 mg/dL (ref 0.2–1.2)
Bilirubin, Direct: 0.1 mg/dL (ref 0.0–0.3)
Total Protein: 7.3 g/dL (ref 6.0–8.3)

## 2017-08-11 NOTE — Patient Instructions (Signed)
Today we updated your med list in our EPIC system...    Continue your current medications the same...  Keep up the good work w/ DIET & EXERCISE...   congrats on the weight loss so far!!!  Today we rechecked your lipid profile on the Atorva40...    We will contact you w/ the results when available...   We gave you the 2018 FLU shot today as well...  Call for any questions...  Let's plan a follow up visit in 70mo, sooner if needed for problems.Marland KitchenMarland Kitchen

## 2017-08-11 NOTE — Progress Notes (Signed)
Subjective:     Patient ID: Ryan [name], male   DOB: September 30, 1956, 61 y.o.   MRN: [id number]  HPI 61 y.o. WM here for a follow up ex-smoker, multifactorial dyspnea including mild restriction, OSA & morbid obesity-- see prob list below>>   ~  February 14, 2012:  51mo ROV & CPX (age61)> Ryan [name]'s CC is his knees, DJD & he is seeing an Orthopedist on [facility] 15mg /d as needed;  Unfortunately his weight is up a further 19# to 355# now in his 5'10" frame for a BMI=51; we discussed bariatric options but he is not interested, states he will "start" diet & continue swimming at the [place] per week...  We reviewed prob list, meds, xrays and labs> see below>>  CXR 4/13 showed norm heart size, clear lungs, NAD...  LABS 4/13:  FLP- at goals on Lip20;  Chems- wnl;  CBC- wnl;  TSH=1.90;  PSA=0.15;  UA=clear...  ~  August 17, 2012:  16mo ROV & Ryan [name] reports a good interval> he had left knee arthroscopic surg 8/13 by Ryan [name] for torn meniscus; did well w/ surg & post op rehab; exercise by swimming but unfortunately he has not lost any weight & we reviewed DIET/ EXERCISE/ weight reduction strategies...     He still smokes a few- [place] the [place]- and denies cough, sputum, SOB, etc;  BP remains OK on diet alone (low sodium) and measures 138/82 today w/o CP, palpit, SOB, edema, etc;  Similarly his Chol is OK on Lip20...    We reviewed prob list, meds, xrays and labs> see below for updates >> OK 2013 Flu vaccine today...  ~  April 13, 2013:  12mo ROV & Ryan [name] informs me that he is not taking his Lip20 as he feels it has caused knee pain (improved off this med); he wants to try Cres10- ok & f/u FLP on the Crestor later... We reviewed the following medical problems during today's office visit >>     Cig smoker> not smoking regularly, "only in casinos" & we discussed smoking cessation, avail meds etc...    HBP> on ASA325 & low sodium diet, no other BP meds; he knows the importance of wt reduction; BP= 140/98 & we will  add the Lasix20, low sodium, wt reduction=> told he will need additional meds if he doesn't get wt down!    Ven Insuffic> he knows to avoid sodium, elev legs, wear support hose; he requests Lasix20 prn edema- now asked to take one daily for this & BP.    CHOL> prev on Lip20- he stopped on his own due to knee pain (better off this med he says); FLP 6/14 on diet alone shows TChol 240, TG 226, HDL 51, LDL162; he wants to try Cres10-ok...    Obesity> weight is up to 365# w/ assoc incr BP, lipids, etc; everything depends on weight reduction; we reviewed diet, exercise, wt reduction strategies; offered dietary consult etc; he refuses to consider bariatric surg...    GI- Divertics, Polyps, Hems w/ rectal stenosis> on Prilosec20 prn; he is overdue for f/u colonoscopy- "They couldn't do it due to my weight >350#" but surely it can be done at the hosp & we will call GI to set this up...    NAFLD> he had Hep steatosis diagnosed in the 1990s by Ryan [name]; fortunately his LFTs have been ok esp since he hasn't been able to lose any weight; labs 6/14 showed LFTs normal...    GU> on Cialis20 as needed.Marland KitchenMarland Kitchen  DJD> on Mobic15 prn; he has had bilat knee arthroscopies but when his knees started hurting he thought it was the Lip20 & stopped it...    Anxiety> see below- no requiring anxiolytic meds... We reviewed prob list, meds, xrays and labs> see below for updates >>   LABS 6/14:  FLP- not at goals off statin rx;  Chems- wnl;  CBC- wnl;  TSH=2.02;  PSA=0.20...  ~  January 11, 2014:  20mo ROV & unfortunately Ryan [name] has gained 10# further up to 374# & he understands the critically important need to lose the weight & get healthier;  He has no new complaints or concerns;  We reviewed the following medical problems during today's office visit >>     Cig smoker> not smoking regularly, "only in casinos" & we discussed smoking cessation, avail meds etc; not requiring breathing meds...    HBP> on DVV616 & low sodium diet, prn  Lasix20, no other BP meds; he knows the importance of wt reduction; BP= 130/84 & he denies HA, CP, palpit, SOB, edema...    Ven Insuffic> he knows to avoid sodium, elev legs, wear support hose; he requests Lasix20 prn edema- ok...    CHOL> prev on Lip20- he stopped on his own due to knee pain (better off this med he says); FLP 3/15 on diet alone shows TChol 242, TG 161, HDL 50, LDL160; he never started the Cres10=> do it NOW & recheck labs 27mo.    Obesity> weight is up to 374# w/ assoc incr BP, lipids, etc; everything depends on weight reduction; we reviewed diet, exercise, wt reduction strategies; offered dietary consult etc & he refused to consider Bariatric surg....    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    NAFLD> he had Hep steatosis diagnosed in the 1990s by Ryan [name]; fortunately his LFTs have been ok esp since he hasn't been able to lose any weight; labs 3/15 showed LFTs normal...    GU> on Cialis20 as needed...     DJD> on Mobic15 prn; he has had bilat knee arthroscopies but when his knees started hurting he thought it was the Lip20 & stopped it...    Anxiety> see below- no requiring anxiolytic meds... We reviewed prob list, meds, xrays and labs> see below for updates >>   CXR 3/15> he forgot to go to Ryan [name] for the needed film...  EKG 3/15 showed NSR, rate88, one PVC, otherw wnl...  LABS 3/15:  FLP- not at goals on diet alone, rec to start Cres10 & recheck 73mo;  Chems- wnl;  CBC- wnl;  TSH=2.25;  PSA=0.18...   ~  December 31, 2016:  61yr [name] disappeared after his 12/2013 CPX- not a single EPIC entry in the last 44yrs!  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 We updated his problem list as follows>>     R/O OSA> now up to >400 lbs, he wakes ~4am tired, no prob driving, naps 7/7 in afternoon, to bed by 8pm for 8H sleep, wakes x2 to urinate & feels that he sleeps well, denies snoring & sleeps on his side (wife passed away yrs ago).    Ex-Cig smoker> prev "only in casinos" & he says none for ~26yr now; we discussed importance of smoking cessation; he is not requiring breathing meds so far...    Borderline HBP> on ASA325 & low sodium diet, not on BP meds; he knows the importance of sodium restriction & wt reduction; BP= 138/74 & he denies HA, CP, palpit, dizzy, ch in edema...    Ven Insuffic> he knows to avoid sodium, elev legs, wear support hose; prev treated w/ Lasix20 prn edema...    CHOL> prev on Lip- he stopped on his own due to knee pain (better off this med he says); FLP 3/15 on diet alone shows TChol 242, TG 161, HDL 50, LDL160 and rec to take Cres10 (never did); Labs 3/18 showed TChol 263, TG 215, HDL 51, LDL 181 => discussed trial Atorva40 w/ f/u labs in several months...    Obesity> weight is up to 410# w/ assoc incr BP, lipids, etc; everything depends on weight reduction; we reviewed diet, exercise, wt reduction strategies; offered dietary consult etc & we discussed the option/ need for Bariatric surg.    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     NAFLD> he had Hep steatosis diagnosed in the 1990s by Ryan [name]; fortunately his LFTs have been ok esp since he hasn't been able to lose any weight; labs 3/18 showed LFTs remain normal...    GU> prev on Cialis20 as needed for ED but he has symptoms of Low-T w/ decr libido, weakness, etc; Labs 3/18 showed Testos level = 174 (300-890) & we will recheck level & start topical product per protocol...    DJD> on Mobic15 prn; he has had bilat knee arthroscopies but when his knees started hurting he thought it was the Lip20 & stopped it, he's been told it's bone-on-bone..    Anxiety> see below- no requiring anxiolytic meds... EXAM shows Afeb, VSS w/ BP=138/74, P=104/reg, Wt=410#, 5'10"Tall, BMI=59; HEENT- neg, mallampati3; Chest- clear w/o w/r/r;  Heart- RR w/o m/r/g; Abd- obese w/ panniculus, soft nontender; Ext- VI 1+edema w/o c/c; Neuro- ok.   CXR 12/31/16 (independently reviewed by me in the PACS system) showed heart at upper lim of norm size, mild bronch wall thickening & bilat pleural thickening, otherw clear, NAD.Marland KitchenMarland Kitchen  EKG 12/31/16 showed STachy, rate 102, late transition, no acute changes...  Ambulatory Oximetry 12/31/16> O2sat=96% on RA at rest w/ pulse=106/min;  He ambulated 3 laps (185'ea) w/ lowest O2sat=94% w/ pulse=132/min;  We were unable to do Spirometry today- machine down.  LABS 12/31/16> FLP- not at goals on diet alone; Chems- ok x BS=108 (Cr=0.93 & LFTs wnl); CBC- wnl x WBC=12.0 w/ norm diff; TSH=4.10; PSA=0.20; Testos=174 (300-890)  2DEcho 01/20/17 showed mod conc LVH, norm LVF w/ EF=55-60% & no RWMA, Gr1DD, mildly dil AscAo at 37mm, norm AoV, MV-ok x triv MR, LA mod  dil at 96mm, RV & PAsys are wnl... IMP>>  His DYSPNEA is clearly multifactorial w/ components from morbid obesity, deconditioning, prob pulm restriction, ex-smoker, r/o OSA, r/o cardiac problems (LVH, Gr1DD, dil asc ao & LA);  If he is to improve his condition going forward he will definitely have to lose weight & in my opinion  Bariatric surg (poss gastric sleeve) is his only hope, although we reviewed DIET/ EXERCISE/ Wt reduction strategies... PLAN>>  Extensive w/u in progress-- we will start MetoprololER100 for LVH & AscAo at 79mm;  Start Atorva40 for Lipids;  Refill his Meloxicam15 prn arthritis pain;  But he understands that substantial improvement requires him to lose wt and inasmuch as he has been totally unable to lose wt x many yrs=> needs to consider Bariatric option as a life saving procedure!  Asked to call [facility] to start the process and learn more about what they can do for him... Note: sleep study, spirometry, lyme titers are pending  ~  February 04, 2017:  26mo ROV & Ryan [name] returns to review the above data and requests that we check LYME titer, he is also due for the 2nd Testos level to meet his insur company requirement for testos replacement therapy;  He has morbid obesity (wt down 3# to 407# today) but he has not called [facility] to inquire about their bariatric program;  His insurance company cancelled his in-lab PSA in favor of a Home Sleep Test=> done 02/04/17 w/ severe OSA being found=> now he needs an in-lab CPAP titration test  Due to nocturnal hypoxemia;  He has borderline HBP & mod concentric LVH & mildly dil asc ao at 51mm-- started  ASA325, MetoprololER100, & Lasix20 as needed for swelling that doesn't go down overnight;  He has hyperlipidemia w/ recent TChol=263, LDL=181-- rec to start Atorva40 + diet;  Finally he also has prescriptions for Prilosec20, Mobic15, and Cialis20    EXAM shows Afeb, VSS w/ BP=122/68, P=94/reg, Wt=407#, 5'10"Tall, BMI=59; HEENT- neg, mallampati3; Chest- clear w/o w/r/r;  Heart- RR w/o m/r/g; Abd- obese w/ panniculus, soft nontender; Ext- VI 1+edema w/o c/c; Neuro- ok.   Spirometry 02/04/17>  FVC=4.21 (87%), FEV1=3.46 (94%), %1sec=82%, mid-flows=119% predicted...   LABS 02/04/17>  Testos (2nd check required by [employer])=214 (300-890);  Lyme test (B.burgdorfi antibodies) =NEG  ((<0.90)  HOME SLEEP STUDY 02/04/17> c/w severe OSA, AHI=57.6/hr of sleep, plus O2desat to 66% & ave=89%, he needs to proceed to CPAP titration for Dx of severe OSA...  IMP/PLAN>>  I discussed w/ Ryan [name] the imperative to lose weight over the long term & the need for CPAP to treat his OSA & nocturnal hypoxemia=> proceed w/ in-lab CPAP titration test & mask fit session;  In the interim we have initiated rx w/ ASA, B-blocker, statin, & he should qualify for topical testos replacement therapy...    ~  August 11, 2017:  42mo ROV & Ryan [name] reports doing better- breathing better, getting about better, min cough/ sput, no hemoptysis, SOB/ DOE improved, no CP, no f/c/s;  He has NOT lost any wt & is up to 409# today...  He saw ORTHO at [facility] 06/25/17> discussed TKR but told he needs to lose wt, prev hx arthroscopy & shots...  We reviewed the following medical problems during today's office visit >>     R/O OSA> still >400 lbs, he wakes ~4am tired, no prob driving, naps 7/7 in afternoon, to bed by 8pm for [place] sleep, wakes x2 to urinate & feels that he sleeps  well, denies snoring & sleeps on his side (wife passed away yrs ago).    Ex-Cig smoker> prev "only in casinos" & he says none for >37yr now; we discussed importance of smoking cessation; he is not requiring breathing meds so far...    HBP> on PJA250 & low sodium diet + MetoprololER100 & Lasix20; BP= 138/80 & he denies HA, CP, palpit, dizzy, ch in edema...    Ven Insuffic> he knows to avoid sodium, elev legs, wear support hose; continue Lasix20/d for edema & BP...    CHOL> prev on Lip- he stopped on his own due to knee pain (better off this med he says); FLP 3/15 on diet alone shows TChol 242, TG 161, HDL 50, LDL160 and rec to take Cres10 (never did); Labs 3/18 showed TChol 263, TG 215, HDL 51, LDL 181 => now on Atorva40 w/ f/u labs 10/18 within parameters...    Obesity> weight is 409# w/ assoc incr BP, lipids, etc; everything depends on weight reduction; we  reviewed diet, exercise, wt reduction strategies; offered dietary consult etc & we discussed the option/ need for Bariatric surg.    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    NAFLD> he had Hep steatosis diagnosed in the 1990s by Ryan [name]; fortunately his LFTs have been ok esp since he hasn't been able to lose any weight; labs 3/18 showed LFTs remain normal...    GU> prev on Cialis20 as needed for ED but he has symptoms of Low-T w/ decr libido, weakness, etc; Labs 3/18 showed Testos level = 174 (300-890), recheck level= 214 & on Androgel 1% 4pumps/d...    DJD> on Mobic15 prn; he has had bilat knee arthroscopies but when his knees started hurting he thought it was the Lip20 & stopped it, he's been told it's bone-on-bone..    Anxiety> see below- no requiring anxiolytic meds...    EXAM shows Afeb, VSS w/ BP=138/80, P=90/reg, Wt=407#, 5'10"Tall, BMI=59; HEENT- neg, mallampati3; Chest- clear w/o w/r/r;  Heart- RR w/o m/r/g; Abd- obese w/ panniculus, soft nontender; Ext- VI 1+edema w/o c/c; Neuro- ok.   LABS 08/11/17>  FLP- at goals on Lip40;  Chems- ok w/ LFTs wnl...  IMP/PLAN>>  Boe's FLP is much improved on the Atorva40; wt unfortunately w/o change & we reviewed diet+exercise; ok flu shot;  Continue meds & ROV in 27mo...          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 ~  3/18>  Pt indicates that he hasn't smoked in ~35yr...  HYPERTENSION (ICD-401.9) - prev on Metoprolol & Accupril but he stopped these on his own when he got serious w/ diet & exercise...  ~  8/10:  weight is down 55# to 299# in 2010 and BP=  130/90 which was better... he is reluctant to restart meds, so we decided on home BP monitoring w/ goal <140/85... ~  10/11:  weight back up to 336# & BP= 142/92, but he doesn't want to restart meds stating that he knows he can improve BP by getting hias weight down w/ diet + exercise... ~  4/13:  Weight up further to 355# & BP= 152/84; denies CP, palpit, SOB, edema; desperately needs to lose the weight or start back on BP meds for cardioprotection. ~  10/13:  BP= 138/82 and he denies symptoms; weight remains elev at 358# & we reviewed the importance of weight reduction... ~  6/14:  on ASA325 & low sodium diet, no other BP meds; he knows the importance of wt reduction; BP= 140/98 & we will add the Lasix20, low sodium, wt reduction=> told he will need additional meds if he doesn't get wt down! ~  3/15:  on ASA325 & low sodium diet, prn Lasix20, no other BP meds; he knows the importance of wt reduction; BP= 130/84 & he denies HA, CP, palpit, SOB, edema... ~  3/18>  Pt weighs 410#, BP=138/74 but 2DEcho shows mod LVH, dil AscAo at 33mm and LAE at 44mm; Rec to start METOPROLO-ER100 daily...  VENOUS INSUFFICIENCY (ICD-459.81) - he has chr ven inuffic & edema... he knows to elim sodium, elevate legs, wear support hose... prev on Lasix20  but off all meds during the last yr or so... ~  6/14:  he knows to avoid sodium, elev legs, wear support hose; he requests Lasix20 prn edema- ok...   HYPERCHOLESTEROLEMIA (ICD-272.0) - prev on LIPITOR 20mg /d + diet efforts... ~  [place] 7/99 on diet showed TChol 256, TG 342, HDL 47, LDL 141... rec> start [place]. ~  [place] 3/04 on Lip10 showed TChol 156, TG 149, HDL 42, LDL 85 ~  FLP 8/07 on Lip10 showed TChol 158, TG 133, HDL 43, LDL 89 ~  FLP 8/10 on diet showed TChol 213, TG 164, HDL 51, LDL 139... he wants to continue diet Rx. ~  FLP 10/11 on diet showed TChol 206, TG 111, HDL 46, LDL 139... rec> start LIPITOR 20mg /d. ~  FLP 4/13 on Lip20 showed TChol 164, TG 127, HDL 51,  LDL 87 ~  FLP 6/14 off Lip20 now showed TChol 240, TG 226, HDL 51, LDL162  ~  FLP 3/15 on diet alone showed TChol 242, TG 161, HDL 50, LDL160  ~  FLP 3/18 on diet alone shows TChol 263, TG 215, HDL 51, LDL 181 => discussed trial Atorva40 w/ f/u labs in several months...  OBESITY (ICD-278.00) - best weight per old chart = 277# in 1990, and worst weight = 355# in 2007... we have had numerous conversations about diet + exercise strategies for weight reduction... 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 ~  he had FlexSig 1991 by Ryan [name] w/ int hems and Rx w/ Anusol... ~  colonoscopy 9/10 by Ryan [name] showed divertics, large bleeding polyp= tubulovillous adenoma, & hems... f/u planned 30yrs. ~  He is sched to have his f/u colonoscopy 12/13 per Ryan [name]=> he has not had this done yet. ~  He cancelled 2015 appt w/ Ryan [name]... ~  3/18>  He needs OV w/ GI to review prev hx & sched his f/u colonoscopy...  Hx of  FATTY LIVER DISEASE (ICD-571.8) - hx elevated LFT's in the 90's believed related to hepatic steatosis w/ eval by Ryan [name] in 01-30-90... he drinks beer on weekends & advised to quit... LFT's have been normal in recent yrs. ~  4/13:  LFTs remain WNL despite weight gain to 355# ~  6/14:  LFTs remain WNL despite wt gain to 365# ~  3/15:  LFTs remain wnl despite his weight of 374# ~  3/18>  Fortunately his LFTs remain wnl despite wt gain to 410#  DEGENERATIVE JOINT DISEASE (ICD-715.90) - he uses OTC analgesics as needed... ~  s/p right knee arthroscopy 2/05 by DrGioffre for meniscus  tear... ~  s/p left knee arthroscopy 8/13 by Ryan [name] for torn meniscus... ~  He says he was old that his knees are bone-on-bone...  ANXIETY (ICD-300.00) - pt's wife died 01-30-2002 from breast cancer- 2 daughters & one has grad from [employer]...  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   Past Surgical History:  Procedure Laterality Date  . COLONOSCOPY W/ POLYPECTOMY  06/27/2009  . FLEXIBLE SIGMOIDOSCOPY    . left knee arthroscopy  05/24/2012   torn meniscus   . right knee arthroscopy  11/2003   Dr. Gladstone Lighter    Outpatient Encounter Prescriptions as of 08/11/2017  Medication Sig  . aspirin 325 MG tablet Take 325 mg by mouth daily.  Marland Kitchen atorvastatin (LIPITOR) 40 MG tablet Take 1 tablet (40 mg total) by mouth daily.  Marland Kitchen CIALIS 20 MG tablet TAKE 1 TABLET DAILY AS NEEDED FOR ERECTILE DYSFUNCTION  . furosemide (LASIX) 20 MG tablet TAKE 1 TABLET DAILY FOR SWELLING THAT DOESN'T GO DOWN OVERNIGHT  . meloxicam (MOBIC) 15 MG tablet Take 1 tablet (15 mg total) by mouth daily.  . metoprolol succinate (TOPROL-XL) 100 MG 24 hr tablet Take 1 tablet (100 mg total) by mouth daily. Take with or immediately following a meal.  . omeprazole (PRILOSEC OTC) 20 MG tablet Take 20 mg by mouth daily. 30 minutes before a meal  . Testosterone 12.5 MG/ACT (1%) GEL 4 pumps rubbing into skin daily in the morning   No facility-administered encounter medications on file as of 08/11/2017.     No Known Allergies    Immunization History  Administered Date(s) Administered  . Influenza Split 08/17/2012  . Influenza Whole 08/19/2010  . Influenza,inj,Quad PF,6+ Mos 08/11/2017     Current Medications, Allergies, Past Medical History, Past Surgical History, Family History, and Social History were reviewed in [employer] record.   Review of Systems       The patient denies fever, chills, sweats, anorexia, fatigue, weakness, malaise, weight loss, sleep disorder, blurring,  diplopia, eye irritation, eye discharge, vision loss, eye pain, photophobia, earache, ear discharge, tinnitus, decreased hearing, nasal congestion, nosebleeds, sore throat, hoarseness, chest pain, palpitations, syncope, dyspnea on exertion, orthopnea, PND, peripheral edema, cough, dyspnea at rest, excessive sputum, hemoptysis, wheezing, pleurisy, nausea, vomiting, diarrhea, constipation, change in bowel habits, abdominal pain, melena, hematochezia, jaundice, gas/bloating, indigestion/heartburn, dysphagia, odynophagia, dysuria, hematuria, urinary frequency, urinary hesitancy, nocturia, incontinence, back pain, joint pain, joint swelling, muscle cramps, muscle weakness, stiffness, arthritis, sciatica, restless legs, leg pain at night, leg pain with exertion, itching, dryness, suspicious lesions, paralysis, paresthesias, seizures, tremors, vertigo, transient blindness, frequent falls, frequent headaches, difficulty walking, depression, anxiety, memory loss, confusion, cold intolerance, heat intolerance, polydipsia, polyphagia, polyuria, unusual weight change, abnormal bruising, bleeding, enlarged lymph nodes, urticaria, allergic rash, hay fever, and recurrent infections.     Objective:  Physical Exam     WD, Obese, 61 y/o WM in NAD... GENERAL:  Alert & oriented; pleasant & cooperative... HEENT:  [place]/AT, EOM-wnl, PERRLA, Fundi-benign, EACs-clear, TMs-wnl, NOSE-clear, THROAT-clear & wnl. NECK:  Supple w/ full ROM; no JVD; normal carotid impulses w/o bruits; no thyromegaly or nodules palpated; no lymphadenopathy. CHEST:  Clear to P & A; without wheezes/ rales/ or rhonchi. HEART:  Regular Rhythm; without murmurs/ rubs/ or gallops. ABDOMEN:  Obese, soft & nontender; normal bowel sounds; no organomegaly or masses detected. (RECTAL:  +rectal stricture, tender, can't reach prostate, stool heme neg) EXT: without deformities or arthritic changes; no varicose veins/ venous insuffic/ or edema. NEURO:  CN's intact;  motor testing normal; sensory testing normal; gait normal & balance OK. DERM:  No lesions noted; no rash etc...  RADIOLOGY DATA:  Reviewed in the EPIC EMR & discussed w/ the patient...  LABORATORY DATA:  Reviewed in the EPIC EMR & discussed w/ the patient...   Assessment:      MORBID OBESITY>> wt up to 410 lbs 12/2016 & now assoc w/ DOE => we reviewed the imperative for weight reduction & I have again rec that he consider Bariatric surg as a life saving procedure for him... R/O OSA w/ Sleep test pending... 12/31/16>   His DYSPNEA is clearly multifactorial w/ components from morbid obesity, deconditioning, prob pulm restriction, ex-smoker, r/o OSA, r/o cardiac problems (LVH, Gr1DD, dil asc ao & LA);  If he is to improve his condition going forward he will definitely have to lose weight & in my opinion Bariatric surg (poss gastric sleeve) is his only hope, although we reviewed DIET/ EXERCISE/ Wt reduction strategies... PLAN>>  Extensive w/u in progress-- we will start MetoprololER100 for LVH & AscAo at 79mm;  Start Atorva40 for Lipids;  Refill his Meloxicam15 prn arthritis pain;  But he understands that substantial improvement requires him to lose wt and inasmuch as he has been totally unable to lose wt x many yrs=> needs to consider Bariatric option as a life saving procedure!  Asked to call [facility] to start the process and learn more about what they can do for him. 02/04/17>   I discussed w/ Ryan [name] the imperative to lose weight over the long term & the need for CPAP to treat his OSA & nocturnal hypoxemia=> proceed w/ in-lab CPAP titration test & mask fit session;  In the interim we have initiated rx w/ ASA, B-blocker, statin, & he should qualify for topical testos replacement therapy...  08/11/17>   Ryan [name]'s FLP is much improved on the Atorva40; wt unfortunately w/o change & we reviewed diet+exercise; ok flu shot;  Continue meds & ROV in 49mo...   HBP>  Borderline BP on "diet" alone; reviewed need for no  salt & weight reduction=> 2DEcho 3/18 shows mod LVH, dilated AscAo at 34mm & Gr1DD => added Metoprolol100/d...  Ven Insuffic>  He knows to elim sodium, elev legs, wear support hose, etc...  CHOL>  Prev on Lip20 but he stopped on his own due to knee pain; FLP was way off & he wanted to try Cres10 but never filled the Rx;  12/2016 rec Atorva40 to start now..  GI> Divertics, Polyps, Hems, rectal stricture>  He uses OTC Prilosec prn; he has rectal stricture & needs to maintain soft stool due to narrow caliber; neg for blood but due for colonoscopy per Ryan [name] soon...  Fatty Liver Dis>  LFTs remain normal despite weight gain...  DJD>  Aware- followed by Ortho on Mobic & we  discussed "wear & tear" joints, his weight, etc...     Plan:     Patient's Medications  New Prescriptions   No medications on file  Previous Medications   ASPIRIN 325 MG TABLET    Take 325 mg by mouth daily.   ATORVASTATIN (LIPITOR) 40 MG TABLET    Take 1 tablet (40 mg total) by mouth daily.   CIALIS 20 MG TABLET    TAKE 1 TABLET DAILY AS NEEDED FOR ERECTILE DYSFUNCTION   FUROSEMIDE (LASIX) 20 MG TABLET    TAKE 1 TABLET DAILY FOR SWELLING THAT DOESN'T GO DOWN OVERNIGHT   MELOXICAM (MOBIC) 15 MG TABLET    Take 1 tablet (15 mg total) by mouth daily.   METOPROLOL SUCCINATE (TOPROL-XL) 100 MG 24 HR TABLET    Take 1 tablet (100 mg total) by mouth daily. Take with or immediately following a meal.   OMEPRAZOLE (PRILOSEC OTC) 20 MG TABLET    Take 20 mg by mouth daily. 30 minutes before a meal   TESTOSTERONE 12.5 MG/ACT (1%) GEL    4 pumps rubbing into skin daily in the morning  Modified Medications   No medications on file  Discontinued Medications   No medications on file

## 2017-12-17 ENCOUNTER — Ambulatory Visit (INDEPENDENT_AMBULATORY_CARE_PROVIDER_SITE_OTHER): Payer: Managed Care, Other (non HMO) | Admitting: Pulmonary Disease

## 2017-12-17 ENCOUNTER — Encounter: Payer: Self-pay | Admitting: Pulmonary Disease

## 2017-12-17 VITALS — BP 152/92 | HR 79 | Temp 97.7°F | Ht 70.0 in | Wt >= 6400 oz

## 2017-12-17 DIAGNOSIS — E7800 Pure hypercholesterolemia, unspecified: Secondary | ICD-10-CM

## 2017-12-17 DIAGNOSIS — I872 Venous insufficiency (chronic) (peripheral): Secondary | ICD-10-CM

## 2017-12-17 DIAGNOSIS — R609 Edema, unspecified: Secondary | ICD-10-CM | POA: Diagnosis not present

## 2017-12-17 DIAGNOSIS — G4733 Obstructive sleep apnea (adult) (pediatric): Secondary | ICD-10-CM

## 2017-12-17 DIAGNOSIS — M150 Primary generalized (osteo)arthritis: Secondary | ICD-10-CM | POA: Diagnosis not present

## 2017-12-17 DIAGNOSIS — R0609 Other forms of dyspnea: Secondary | ICD-10-CM | POA: Diagnosis not present

## 2017-12-17 DIAGNOSIS — I10 Essential (primary) hypertension: Secondary | ICD-10-CM

## 2017-12-17 DIAGNOSIS — E291 Testicular hypofunction: Secondary | ICD-10-CM | POA: Diagnosis not present

## 2017-12-17 DIAGNOSIS — M159 Polyosteoarthritis, unspecified: Secondary | ICD-10-CM

## 2017-12-17 MED ORDER — FUROSEMIDE 20 MG PO TABS: ORAL_TABLET | ORAL | 2 refills | Status: DC

## 2017-12-17 NOTE — Progress Notes (Signed)
Subjective:     Patient ID: Ryan [name], male   DOB: 02-Jul-1956, 62 y.o.   MRN: [id number]  HPI 62 y/o WM here for a follow up ex-smoker, multifactorial dyspnea including mild restriction, OSA & morbid obesity-- see prob list below>>   ~  February 14, 2012:  69mo ROV & CPX (age56)> Ryan [name]'s CC is his knees, DJD & he is seeing an Orthopedist on [facility] 15mg /d as needed;  Unfortunately his weight is up a further 19# to 355# now in his 5'10" frame for a BMI=51; we discussed bariatric options but he is not interested, states he will "start" diet & continue swimming at the [place] per week...  We reviewed prob list, meds, xrays and labs> see below>>  CXR 4/13 showed norm heart size, clear lungs, NAD...  LABS 4/13:  FLP- at goals on Lip20;  Chems- wnl;  CBC- wnl;  TSH=1.90;  PSA=0.15;  UA=clear...  ~  August 17, 2012:  32mo ROV & Ryan [name] reports a good interval> he had left knee arthroscopic surg 8/13 by DrNorris for torn meniscus; did well w/ surg & post op rehab; exercise by swimming but unfortunately he has not lost any weight & we reviewed DIET/ EXERCISE/ weight reduction strategies...     He still smokes a few- [place]- and denies cough, sputum, SOB, etc;  BP remains OK on diet alone (low sodium) and measures 138/82 today w/o CP, palpit, SOB, edema, etc;  Similarly his Chol is OK on Lip20...    We reviewed prob list, meds, xrays and labs> see below for updates >> OK 2013 Flu vaccine today...  ~  April 13, 2013:  20mo ROV & Ryan [name] informs me that he is not taking his Lip20 as he feels it has caused knee pain (improved off this med); he wants to try Cres10- ok & f/u FLP on the Crestor later... We reviewed the following medical problems during today's office visit >>     Cig smoker> not smoking regularly, "only in casinos" & we discussed smoking cessation, avail meds etc...    HBP> on ASA325 & low sodium diet, no other BP meds; he knows the importance of wt reduction; BP= 140/98 & we will  add the Lasix20, low sodium, wt reduction=> told he will need additional meds if he doesn't get wt down!    Ven Insuffic> he knows to avoid sodium, elev legs, wear support hose; he requests Lasix20 prn edema- now asked to take one daily for this & BP.    CHOL> prev on Lip20- he stopped on his own due to knee pain (better off this med he says); FLP 6/14 on diet alone shows TChol 240, TG 226, HDL 51, LDL162; he wants to try Cres10-ok...    Obesity> weight is up to 365# w/ assoc incr BP, lipids, etc; everything depends on weight reduction; we reviewed diet, exercise, wt reduction strategies; offered dietary consult etc; he refuses to consider bariatric surg...    GI- Divertics, Polyps, Hems w/ rectal stenosis> on Prilosec20 prn; he is overdue for f/u colonoscopy- "They couldn't do it due to my weight >350#" but surely it can be done at the hosp & we will call GI to set this up...    NAFLD> he had Hep steatosis diagnosed in the 1990s by DrPatterson; fortunately his LFTs have been ok esp since he hasn't been able to lose any weight; labs 6/14 showed LFTs normal...    GU> on Cialis20 as needed.Marland KitchenMarland Kitchen  DJD> on Mobic15 prn; he has had bilat knee arthroscopies but when his knees started hurting he thought it was the Lip20 & stopped it...    Anxiety> see below- no requiring anxiolytic meds... We reviewed prob list, meds, xrays and labs> see below for updates >>   LABS 6/14:  FLP- not at goals off statin rx;  Chems- wnl;  CBC- wnl;  TSH=2.02;  PSA=0.20...  ~  January 11, 2014:  55mo ROV & unfortunately Ryan [name] has gained 10# further up to 374# & he understands the critically important need to lose the weight & get healthier;  He has no new complaints or concerns;  We reviewed the following medical problems during today's office visit >>     Cig smoker> not smoking regularly, "only in casinos" & we discussed smoking cessation, avail meds etc; not requiring breathing meds...    HBP> on GEX528 & low sodium diet, prn  Lasix20, no other BP meds; he knows the importance of wt reduction; BP= 130/84 & he denies HA, CP, palpit, SOB, edema...    Ven Insuffic> he knows to avoid sodium, elev legs, wear support hose; he requests Lasix20 prn edema- ok...    CHOL> prev on Lip20- he stopped on his own due to knee pain (better off this med he says); FLP 3/15 on diet alone shows TChol 242, TG 161, HDL 50, LDL160; he never started the Cres10=> do it NOW & recheck labs 23mo.    Obesity> weight is up to 374# w/ assoc incr BP, lipids, etc; everything depends on weight reduction; we reviewed diet, exercise, wt reduction strategies; offered dietary consult etc & he refused to consider Bariatric surg....    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    NAFLD> he had Hep steatosis diagnosed in the 1990s by DrPatterson; fortunately his LFTs have been ok esp since he hasn't been able to lose any weight; labs 3/15 showed LFTs normal...    GU> on Cialis20 as needed...     DJD> on Mobic15 prn; he has had bilat knee arthroscopies but when his knees started hurting he thought it was the Lip20 & stopped it...    Anxiety> see below- no requiring anxiolytic meds... We reviewed prob list, meds, xrays and labs> see below for updates >>   CXR 3/15> he forgot to go to [facility] for the needed film...  EKG 3/15 showed NSR, rate88, one PVC, otherw wnl...  LABS 3/15:  FLP- not at goals on diet alone, rec to start Cres10 & recheck 58mo;  Chems- wnl;  CBC- wnl;  TSH=2.25;  PSA=0.18...   ~  December 31, 2016:  44yr [place] disappeared after his 12/2013 CPX- not a single EPIC entry in the last 69yrs!  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 We updated his problem list as follows>>     R/O OSA> now up to >400 lbs, he wakes ~4am tired, no prob driving, naps 7/7 in afternoon, to bed by 8pm for 8H sleep, wakes x2 to urinate & feels that he sleeps well, denies snoring & sleeps on his side (wife passed away yrs ago).    Ex-Cig smoker> prev "only in casinos" & he says none for ~33yr now; we discussed importance of smoking cessation; he is not requiring breathing meds so far...    Borderline HBP> on ASA325 & low sodium diet, not on BP meds; he knows the importance of sodium restriction & wt reduction; BP= 138/74 & he denies HA, CP, palpit, dizzy, ch in edema...    Ven Insuffic> he knows to avoid sodium, elev legs, wear support hose; prev treated w/ Lasix20 prn edema...    CHOL> prev on Lip- he stopped on his own due to knee pain (better off this med he says); FLP 3/15 on diet alone shows TChol 242, TG 161, HDL 50, LDL160 and rec to take Cres10 (never did); Labs 3/18 showed TChol 263, TG 215, HDL 51, LDL 181 => discussed trial Atorva40 w/ f/u labs in several months...    Obesity> weight is up to 410# w/ assoc incr BP, lipids, etc; everything depends on weight reduction; we reviewed diet, exercise, wt reduction strategies; offered dietary consult etc & we discussed the option/ need for Bariatric surg.    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     NAFLD> he had Hep steatosis diagnosed in the 1990s by DrPatterson; fortunately his LFTs have been ok esp since he hasn't been able to lose any weight; labs 3/18 showed LFTs remain normal...    GU> prev on Cialis20 as needed for ED but he has symptoms of Low-T w/ decr libido, weakness, etc; Labs 3/18 showed Testos level = 174 (300-890) & we will recheck level & start topical product per protocol...    DJD> on Mobic15 prn; he has had bilat knee arthroscopies but when his knees started hurting he thought it was the Lip20 & stopped it, he's been told it's bone-on-bone..    Anxiety> see below- no requiring anxiolytic meds... EXAM shows Afeb, VSS w/ BP=138/74, P=104/reg, Wt=410#, 5'10"Tall, BMI=59; HEENT- neg, mallampati3; Chest- clear w/o w/r/r;  Heart- RR w/o m/r/g; Abd- obese w/ panniculus, soft nontender; Ext- VI 1+edema w/o c/c; Neuro- ok.   CXR 12/31/16 (independently reviewed by me in the PACS system) showed heart at upper lim of norm size, mild bronch wall thickening & bilat pleural thickening, otherw clear, NAD.Marland KitchenMarland Kitchen  EKG 12/31/16 showed STachy, rate 102, late transition, no acute changes...  Ambulatory Oximetry 12/31/16> O2sat=96% on RA at rest w/ pulse=106/min;  He ambulated 3 laps (185'ea) w/ lowest O2sat=94% w/ pulse=132/min;  We were unable to do Spirometry today- machine down.  LABS 12/31/16> FLP- not at goals on diet alone; Chems- ok x BS=108 (Cr=0.93 & LFTs wnl); CBC- wnl x WBC=12.0 w/ norm diff; TSH=4.10; PSA=0.20; Testos=174 (300-890)  2DEcho 01/20/17 showed mod conc LVH, norm LVF w/ EF=55-60% & no RWMA, Gr1DD, mildly dil AscAo at 109mm, norm AoV, MV-ok x triv MR, LA mod  dil at 35mm, RV & PAsys are wnl... IMP>>  His DYSPNEA is clearly multifactorial w/ components from morbid obesity, deconditioning, prob pulm restriction, ex-smoker, r/o OSA, r/o cardiac problems (LVH, Gr1DD, dil asc ao & LA);  If he is to improve his condition going forward he will definitely have to lose weight & in my opinion  Bariatric surg (poss gastric sleeve) is his only hope, although we reviewed DIET/ EXERCISE/ Wt reduction strategies... PLAN>>  Extensive w/u in progress-- we will start MetoprololER100 for LVH & AscAo at 57mm;  Start Atorva40 for Lipids;  Refill his Meloxicam15 prn arthritis pain;  But he understands that substantial improvement requires him to lose wt and inasmuch as he has been totally unable to lose wt x many yrs=> needs to consider Bariatric option as a life saving procedure!  Asked to call [facility] to start the process and learn more about what they can do for him... Note: sleep study, spirometry, lyme titers are pending  ~  February 04, 2017:  56mo ROV & Ryan [name] returns to review the above data and requests that we check LYME titer, he is also due for the 2nd Testos level to meet his insur company requirement for testos replacement therapy;  He has morbid obesity (wt down 3# to 407# today) but he has not called [facility] to inquire about their bariatric program;  His insurance company cancelled his in-lab PSA in favor of a Home Sleep Test=> done 02/04/17 w/ severe OSA being found=> now he needs an in-lab CPAP titration test  Due to nocturnal hypoxemia;  He has borderline HBP & mod concentric LVH & mildly dil asc ao at 29mm-- started  ASA325, MetoprololER100, & Lasix20 as needed for swelling that doesn't go down overnight;  He has hyperlipidemia w/ recent TChol=263, LDL=181-- rec to start Atorva40 + diet;  Finally he also has prescriptions for Prilosec20, Mobic15, and Cialis20    EXAM shows Afeb, VSS w/ BP=122/68, P=94/reg, Wt=407#, 5'10"Tall, BMI=59; HEENT- neg, mallampati3; Chest- clear w/o w/r/r;  Heart- RR w/o m/r/g; Abd- obese w/ panniculus, soft nontender; Ext- VI 1+edema w/o c/c; Neuro- ok.   Spirometry 02/04/17>  FVC=4.21 (87%), FEV1=3.46 (94%), %1sec=82%, mid-flows=119% predicted...   LABS 02/04/17>  Testos (2nd check required by [employer])=214 (300-890);  Lyme test (B.burgdorfi antibodies) =NEG  ((<0.90)  HOME SLEEP STUDY 02/04/17> c/w severe OSA, AHI=57.6/hr of sleep, plus O2desat to 66% & ave=89%, he needs to proceed to CPAP titration for Dx of severe OSA...  IMP/PLAN>>  I discussed w/ Ryan [name] the imperative to lose weight over the long term & the need for CPAP to treat his OSA & nocturnal hypoxemia=> proceed w/ in-lab CPAP titration test & mask fit session;  In the interim we have initiated rx w/ ASA, B-blocker, statin, & he should qualify for topical testos replacement therapy...   ~  August 11, 2017:  65mo ROV & Ryan [name] reports doing better- breathing better, getting about better, min cough/ sput, no hemoptysis, SOB/ DOE improved, no CP, no f/c/s;  He has NOT lost any wt & is up to 409# today...  He saw ORTHO at [facility] 06/25/17> discussed TKR but told he needs to lose wt, prev hx arthroscopy & shots...  We reviewed the following medical problems during today's office visit >>     R/O OSA> still >400 lbs, he wakes ~4am tired, no prob driving, naps 7/7 in afternoon, to bed by 8pm for [place] sleep, wakes x2 to urinate & feels that he sleeps well,  denies snoring & sleeps on his side (wife passed away yrs ago).    Ex-Cig smoker> prev "only in casinos" & he says none for >44yr now; we discussed importance of smoking cessation; he is not requiring breathing meds so far...    HBP & Mod LVH on 2DEcho> on ASA325 & low sodium diet + MetoprololER100 & Lasix20; BP= 138/80 & he denies HA, CP, palpit, dizzy, ch in edema...    Ven Insuffic> he knows to avoid sodium, elev legs, wear support hose; continue Lasix20/d for edema & BP...    CHOL> prev on Lip- he stopped on his own due to knee pain (better off this med he says); FLP 3/15 on diet alone shows TChol 242, TG 161, HDL 50, LDL160 and rec to take Cres10 (never did); Labs 3/18 showed TChol 263, TG 215, HDL 51, LDL 181 => now on Atorva40 w/ f/u labs 10/18 within parameters...    Obesity> weight is 409# w/ assoc incr BP, lipids, etc; everything depends on  weight reduction; we reviewed diet, exercise, wt reduction strategies; offered dietary consult etc & we discussed the option/ need for Bariatric surg.    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    NAFLD> he had Hep steatosis diagnosed in the 1990s by DrPatterson; fortunately his LFTs have been ok esp since he hasn't been able to lose any weight; labs 3/18 showed LFTs remain normal...    GU> prev on Cialis20 as needed for ED but he has symptoms of Low-T w/ decr libido, weakness, etc; Labs 3/18 showed Testos level = 174 (300-890), recheck level= 214 & on Androgel 1% 4pumps/d...    DJD> on Mobic15 prn; he has had bilat knee arthroscopies but when his knees started hurting he thought it was the Lip20 & stopped it, he's been told it's bone-on-bone..    Anxiety> see below- no requiring anxiolytic meds... EXAM shows Afeb, VSS w/ BP=138/80, P=90/reg, Wt=407#, 5'10"Tall, BMI=59; HEENT- neg, mallampati3; Chest- clear w/o w/r/r;  Heart- RR w/o m/r/g; Abd- obese w/ panniculus, soft nontender; Ext- VI 1+edema w/o c/c; Neuro- ok.   LABS 08/11/17>  FLP- at goals on Lip40;  Chems- ok w/ LFTs wnl...  IMP/PLAN>>  Ryan [name]'s FLP is much improved on the Atorva40; wt unfortunately w/o change & we reviewed diet+exercise; ok flu shot;  Continue meds & ROV in 33mo...   ~  December 17, 2017:  21mo ROV & add-on appt requested for HBP>  Ryan [name] went to Ortho for knee pain w/ XRays and cortisone shots; BP was noted to be elev in their office measuring 180/130 he says, then 170/120 on recheck;  He has hx HBP, morbid obesity w/ Wt~410# (BMI~59) and unable to lose wt despite his good efforts w/ diet 7 exercise; exercise lim by DJD knees & he tells me he started swimming 4d/wk recently... Pt also reports that he stopped taking his Atorva40 due  to dizziness, and stopped taking his BP med= MetoprololER 100mg  on his own; he has Lasix20 which he uses prn for edema due to his VI;  He is also on Testosterone gel (4 pumps rubbed into skin daily)... He denies CP, palpit, ch in SOB, 1+edema noted...    We reviewed the above PROB LIST...  LABS 07/2017 reviewed...  EXAM shows Afeb, VSS  w/ BP=152/92, P=79/reg, Wt=410# (BMI=59); HEENT- neg, mallampati3; Chest- clear w/o w/r/r;  Heart- RR w/o m/r/g; Abd- obese w/ panniculus, soft nontender; Ext- VI 1+edema w/o c/c; Neuro- no focal deficits... IMP/PLAN>>  He has known HBP, mod LVH on 2DEcho & we reviewed low sodium weight reducing diet & incr exercise to facilitate wt reduction; restart METOPROLOL-ER 100mg /d + LASIX20 Qam and monitor BP at home if able, call for any symptoms/ problems and we will recheck in 6wks w/ FASTING blood work...            Problem List:   Severe OSA> on Home Sleep Study 02/04/17> c/w severe OSA, AHI=57.6/hr of sleep, plus O2desat to 66% & ave=89%, he needs to proceed to CPAP titration for Dx of severe OSA=> pt cancelled twice;  NOTE> still >400 lbs, he wakes ~4am tired, no prob driving, naps 7/7 in afternoon, to bed by 8pm for 8H sleep, wakes x2 to urinate & feels that he sleeps well, denies snoring & sleeps on his side (wife passed away yrs ago)... We have offered AUTO CPAP trial and Sleep Med consult but he declines intervention...   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 ~  3/18>  Pt indicates that he hasn't smoked in ~12yr...  HYPERTENSION/ Mod LVH on 2DEcho - prev on Metoprolol & Accupril but he stopped these on his own when he got serious w/ diet & exercise...  ~  8/10:  weight is down 55# to 299# in 2010 and BP= 130/90 which was better... he is reluctant to restart meds, so we decided on home BP  monitoring w/ goal <140/85... ~  10/11:  weight back up to 336# & BP= 142/92, but he doesn't want to restart meds stating that he knows he can improve BP by getting hias weight down w/ diet + exercise... ~  4/13:  Weight up further to 355# & BP= 152/84; denies CP, palpit, SOB, edema; desperately needs to lose the weight or start back on BP meds for cardioprotection. ~  10/13:  BP= 138/82 and he denies symptoms; weight remains elev at 358# & we reviewed the importance of weight reduction... ~  6/14:  on ASA325 & low sodium diet, no other BP meds; he knows the importance of wt reduction; BP= 140/98 & we will add the Lasix20, low sodium, wt reduction=> told he will need additional meds if he doesn't get wt down! ~  3/15:  on ASA325 & low sodium diet, prn Lasix20, no other BP meds; he knows the importance of wt reduction; BP= 130/84 & he denies HA, CP, palpit, SOB, edema... ~  3/18>  Pt weighs 410#, BP=138/74 but 2DEcho shows mod LVH, dil AscAo at 28mm and LAE at 14mm; Rec to start METOPROLO-ER100 daily...  VENOUS INSUFFICIENCY (ICD-459.81) - he has chr ven inuffic & edema... he knows to elim sodium, elevate legs, wear support hose... prev on Lasix20  but off all meds during the last yr or so... ~  6/14:  he knows to avoid sodium, elev legs, wear support hose; he requests Lasix20 prn edema- ok...   HYPERCHOLESTEROLEMIA (ICD-272.0) - prev on LIPITOR 20mg /d + diet efforts... ~  [place] 7/99 on diet showed TChol 256, TG 342, HDL 47, LDL 141... rec> start [place]. ~  [place] 3/04 on Lip10 showed TChol 156, TG 149, HDL 42,  LDL 85 ~  FLP 8/07 on Lip10 showed TChol 158, TG 133, HDL 43, LDL 89 ~  FLP 8/10 on diet showed TChol 213, TG 164, HDL 51, LDL 139... he wants to continue diet Rx. ~  FLP 10/11 on diet showed TChol 206, TG 111, HDL 46, LDL 139... rec> start LIPITOR 20mg /d. ~  FLP 4/13 on Lip20 showed TChol 164, TG 127, HDL 51, LDL 87 ~  FLP 6/14 off Lip20 now showed TChol 240, TG 226, HDL 51, LDL162  ~  FLP  3/15 on diet alone showed TChol 242, TG 161, HDL 50, LDL160  ~  FLP 3/18 on diet alone shows TChol 263, TG 215, HDL 51, LDL 181 => discussed trial Atorva40 w/ f/u labs in several months...  OBESITY (ICD-278.00) - best weight per old chart = 277# in 1989/01/21, and worst weight = 355# in January 21, 2006... we have had numerous conversations about diet + exercise strategies for weight reduction... 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 ~  he had FlexSig 1991 by DrPatterson w/ int hems and Rx w/ Anusol... 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 f/u planned 95yrs. ~  He is sched to have his f/u colonoscopy 12/13 per DrPatterson=> he has not had this done yet. ~  He cancelled 2014-01-21 appt w/ DrGessner... ~  3/18>  He needs OV w/ GI to review prev hx & sched his f/u colonoscopy...  Hx of FATTY LIVER DISEASE (ICD-571.8) - hx elevated LFT's in the 90's believed related to hepatic steatosis w/ eval by DrPatterson in 1990/01/21... he drinks beer on weekends & advised to quit... LFT's have been normal in recent yrs. ~  4/13:  LFTs remain WNL despite weight gain to 355# ~  6/14:  LFTs remain WNL despite wt gain to 365# ~  3/15:  LFTs remain wnl despite his weight of 374# ~  3/18>  Fortunately his LFTs remain wnl despite wt gain to 410#  DEGENERATIVE JOINT DISEASE (ICD-715.90) - he uses OTC analgesics as needed... ~  s/p right knee arthroscopy 2/05 by DrGioffre for meniscus tear... ~  s/p left knee arthroscopy 8/13 by drNorris for torn meniscus... ~  He  says he was old that his knees are bone-on-bone...  ANXIETY (ICD-300.00) - pt's wife died 01-21-2002 from breast cancer- 2 daughters & one has grad from [employer]...  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   Past Surgical History:  Procedure Laterality Date  . COLONOSCOPY W/ POLYPECTOMY  06/27/2009  . FLEXIBLE SIGMOIDOSCOPY    . left knee arthroscopy  05/24/2012   torn meniscus   . right knee arthroscopy  11/2003   Dr. Gladstone Lighter    Outpatient Encounter Medications as of 12/17/2017  Medication Sig  . aspirin 325 MG tablet Take 325 mg by mouth daily.  Marland Kitchen CIALIS 20 MG tablet TAKE 1 TABLET DAILY AS NEEDED FOR ERECTILE DYSFUNCTION  .  furosemide (LASIX) 20 MG tablet TAKE 1 TABLET DAILY EVERY MORNING  . Naproxen Sodium (ALEVE) 220 MG CAPS Take by mouth.  Marland Kitchen omeprazole (PRILOSEC OTC) 20 MG tablet Take 20 mg by mouth daily. 30 minutes before a meal AS NEEDED  . Testosterone 12.5 MG/ACT (1%) GEL 4 pumps rubbing into skin daily in the morning  . [DISCONTINUED] furosemide (LASIX) 20 MG tablet TAKE 1 TABLET DAILY FOR SWELLING THAT DOESN'T GO DOWN OVERNIGHT (Patient taking differently: TAKE 1 TABLET DAILY EVERY MORNING)  . atorvastatin (LIPITOR) 40 MG tablet Take 1 tablet (40 mg total) by mouth daily. (Patient not taking: Reported on 12/17/2017)  . meloxicam (MOBIC) 15 MG tablet Take 1 tablet (15 mg total) by mouth daily. (Patient not taking: Reported on 12/17/2017)  . metoprolol succinate (TOPROL-XL) 100 MG 24 hr tablet Take 1 tablet (100 mg total) by mouth daily. Take with or immediately following a meal. (Patient not taking: Reported on 12/17/2017)   No facility-administered encounter medications on file as of 12/17/2017.     No Known Allergies     Immunization History  Administered Date(s) Administered  . Influenza Split 08/17/2012  . Influenza Whole 08/19/2010  . Influenza,inj,Quad PF,6+ Mos 08/11/2017     Current Medications, Allergies, Past Medical History, Past  Surgical History, Family History, and Social History were reviewed in [employer] record.   Review of Systems       The patient denies fever, chills, sweats, anorexia, fatigue, weakness, malaise, weight loss, sleep disorder, blurring, diplopia, eye irritation, eye discharge, vision loss, eye pain, photophobia, earache, ear discharge, tinnitus, decreased hearing, nasal congestion, nosebleeds, sore throat, hoarseness, chest pain, palpitations, syncope, dyspnea on exertion, orthopnea, PND, peripheral edema, cough, dyspnea at rest, excessive sputum, hemoptysis, wheezing, pleurisy, nausea, vomiting, diarrhea, constipation, change in bowel habits, abdominal pain, melena, hematochezia, jaundice, gas/bloating, indigestion/heartburn, dysphagia, odynophagia, dysuria, hematuria, urinary frequency, urinary hesitancy, nocturia, incontinence, back pain, joint pain, joint swelling, muscle cramps, muscle weakness, stiffness, arthritis, sciatica, restless legs, leg pain at night, leg pain with exertion, itching, dryness, suspicious lesions, paralysis, paresthesias, seizures, tremors, vertigo, transient blindness, frequent falls, frequent headaches, difficulty walking, depression, anxiety, memory loss, confusion, cold intolerance, heat intolerance, polydipsia, polyphagia, polyuria, unusual weight change, abnormal bruising, bleeding, enlarged lymph nodes, urticaria, allergic rash, hay fever, and recurrent infections.     Objective:   Physical Exam     WD, Obese, 62 y/o WM in NAD... GENERAL:  Alert & oriented; pleasant & cooperative... HEENT:  [place]/AT, EOM-wnl, PERRLA, Fundi-benign, EACs-clear, TMs-wnl, NOSE-clear, THROAT-clear & wnl. NECK:  Supple w/ full ROM; no JVD; normal carotid impulses w/o bruits; no thyromegaly or nodules palpated; no lymphadenopathy. CHEST:  Clear to P & A; without wheezes/ rales/ or rhonchi. HEART:  Regular Rhythm; without murmurs/ rubs/ or gallops. ABDOMEN:  Obese, soft  & nontender; normal bowel sounds; no organomegaly or masses detected. (RECTAL:  +rectal stricture, tender, can't reach prostate, stool heme neg) EXT: without deformities or arthritic changes; no varicose veins/ venous insuffic/ or edema. NEURO:  CN's intact; motor testing normal; sensory testing normal; gait normal & balance OK. DERM:  No lesions noted; no rash etc...  RADIOLOGY DATA:  Reviewed in the EPIC EMR & discussed w/ the patient...  LABORATORY DATA:  Reviewed in the EPIC EMR & discussed w/ the patient...   Assessment:      12/31/16>   His DYSPNEA is clearly multifactorial w/ components from morbid obesity, deconditioning, prob pulm restriction, ex-smoker, r/o OSA, r/o cardiac problems (LVH, Gr1DD,  dil asc ao & LA);  If he is to improve his condition going forward he will definitely have to lose weight & in my opinion Bariatric surg (poss gastric sleeve) is his only hope, although we reviewed DIET/ EXERCISE/ Wt reduction strategies... PLAN>>  Extensive w/u in progress-- we will start MetoprololER100 for LVH & AscAo at 25mm;  Start Atorva40 for Lipids;  Refill his Meloxicam15 prn arthritis pain;  But he understands that substantial improvement requires him to lose wt and inasmuch as he has been totally unable to lose wt x many yrs=> needs to consider Bariatric option as a life saving procedure!  Asked to call [facility] to start the process and learn more about what they can do for him. 02/04/17>   I discussed w/ Ryan [name] the imperative to lose weight over the long term & the need for CPAP to treat his OSA & nocturnal hypoxemia=> proceed w/ in-lab CPAP titration test & mask fit session;  In the interim we have initiated rx w/ ASA, B-blocker, statin, & he should qualify for topical testos replacement therapy...  08/11/17>   Ryan [name]'s FLP is much improved on the Atorva40; wt unfortunately w/o change & we reviewed diet+exercise; ok flu shot;  Continue meds & ROV in 61mo... 12/17/17>   We reviewed low sodium  weight reducing diet & incr exercise to facilitate wt reduction; restart METOPROLOL-ER 100mg /d + LASIX20 Qam and monitor BP at home if able, call for any symptoms/ problems and we will recheck in 6wks w/ FASTING blood work   MORBID OBESITY>> wt up to 410 lbs he's been unable to lose, & assoc w/ DOE => we reviewed the imperative for weight reduction & I have again rec that he consider Bariatric surg as a life saving procedure for him (he declines)...  Severe OSA> on Home Sleep Study 02/04/17> c/w severe OSA, AHI=57.6/hr of sleep, plus O2desat to 66% & ave=89%, he needs to proceed to CPAP titration for Dx of severe OSA=> pt cancelled twice;  NOTE> still >400 lbs, he wakes ~4am tired, no prob driving, naps 7/7 in afternoon, to bed by 8pm for 8H sleep, wakes x2 to urinate & feels that he sleeps well, denies snoring & sleeps on his side (wife passed away yrs ago)... We have offered AUTO CPAP trial and Sleep Med consult but he declines intervention...   HBP>  Borderline BP on "diet" alone; reviewed need for no salt & weight reduction=> 2DEcho 3/18 shows mod LVH, dilated AscAo at 32mm & Gr1DD => added Metoprolol100/d...  Ven Insuffic>  He knows to elim sodium, elev legs, wear support hose, etc...  CHOL>  Prev on Lip20 but he stopped on his own due to knee pain; FLP was way off & he wanted to try Cres10 but never filled the Rx;  12/2016 rec Atorva40 to start now..  GI> Divertics, Polyps, Hems, rectal stricture>  He uses OTC Prilosec prn; he has rectal stricture & needs to maintain soft stool due to narrow caliber; neg for blood but due for colonoscopy per DrPatterson soon...  Fatty Liver Dis>  LFTs remain normal despite weight gain...  DJD>  Aware- followed by Ortho on Mobic & we discussed "wear & tear" joints, his weight, etc...     Plan:     Patient's Medications  New Prescriptions   No medications on file  Previous Medications   ASPIRIN 325 MG TABLET    Take 325 mg by mouth daily.   ATORVASTATIN  (LIPITOR) 40 MG TABLET  Take 1 tablet (40 mg total) by mouth daily.   CIALIS 20 MG TABLET    TAKE 1 TABLET DAILY AS NEEDED FOR ERECTILE DYSFUNCTION   MELOXICAM (MOBIC) 15 MG TABLET    Take 1 tablet (15 mg total) by mouth daily.   METOPROLOL SUCCINATE (TOPROL-XL) 100 MG 24 HR TABLET    Take 1 tablet (100 mg total) by mouth daily. Take with or immediately following a meal.   NAPROXEN SODIUM (ALEVE) 220 MG CAPS    Take by mouth.   OMEPRAZOLE (PRILOSEC OTC) 20 MG TABLET    Take 20 mg by mouth daily. 30 minutes before a meal AS NEEDED   TESTOSTERONE 12.5 MG/ACT (1%) GEL    4 pumps rubbing into skin daily in the morning  Modified Medications   Modified Medication Previous Medication   FUROSEMIDE (LASIX) 20 MG TABLET furosemide (LASIX) 20 MG tablet      TAKE 1 TABLET DAILY EVERY MORNING    TAKE 1 TABLET DAILY FOR SWELLING THAT DOESN'T GO DOWN OVERNIGHT  Discontinued Medications   No medications on file

## 2017-12-17 NOTE — Patient Instructions (Signed)
Today we updated your med list in our EPIC system...    For your Blood Pressure >>    NO SALT -- see handouts on low sodium diet...    Start the LASIX (Furosemide) 20mg  tabs - one tab each AM    Start the TOPROL-XL (Metoprolol-ER) 100mg  tabs - one tab daily...  Keep up the great job w/ exercise (swimming)...    We've got to do better w/ calorie restriction/ weight reduction...  Call for any questions...  Let's plan a follow up visit in 6wks as planned for your check up & FASTING blood work.Marland KitchenMarland Kitchen

## 2018-01-12 ENCOUNTER — Other Ambulatory Visit: Payer: Self-pay | Admitting: *Deleted

## 2018-01-12 ENCOUNTER — Telehealth: Payer: Self-pay | Admitting: Pulmonary Disease

## 2018-01-12 MED ORDER — FUROSEMIDE 20 MG PO TABS: ORAL_TABLET | ORAL | 2 refills | Status: DC

## 2018-01-12 NOTE — Progress Notes (Signed)
Patient requested [facility] for Furosemide 20mg  #90 with 2 refills 01/12/18.  Called [facility] in [place] and told them Patient request for [facility].  Furosemide 20mg  #90 with 2 refills order placed to [facility] 01/12/18.

## 2018-01-12 NOTE — Telephone Encounter (Signed)
lmtcb x1 for pt. 

## 2018-01-14 MED ORDER — TESTOSTERONE 12.5 MG/ACT (1%) TD GEL: TRANSDERMAL | 3 refills | Status: DC

## 2018-01-14 NOTE — Telephone Encounter (Signed)
Per SN, it is okay to refill pt's testosterone androgel.  Faxed Rx to [facility] for pt and did receive a confirmation back that that the fax went through.  Called pt letting him know the above information. Pt expressed understanding. Nothing further needed at this time.

## 2018-01-19 ENCOUNTER — Other Ambulatory Visit: Payer: Self-pay | Admitting: *Deleted

## 2018-01-19 MED ORDER — TESTOSTERONE 12.5 MG/ACT (1%) TD GEL: TRANSDERMAL | 3 refills | Status: DC

## 2018-01-26 ENCOUNTER — Encounter: Payer: Self-pay | Admitting: Pulmonary Disease

## 2018-01-26 ENCOUNTER — Ambulatory Visit (INDEPENDENT_AMBULATORY_CARE_PROVIDER_SITE_OTHER): Payer: Managed Care, Other (non HMO) | Admitting: Pulmonary Disease

## 2018-01-26 VITALS — BP 168/86 | HR 93 | Temp 97.7°F | Ht 70.0 in | Wt 399.6 lb

## 2018-01-26 DIAGNOSIS — G4733 Obstructive sleep apnea (adult) (pediatric): Secondary | ICD-10-CM | POA: Diagnosis not present

## 2018-01-26 DIAGNOSIS — E291 Testicular hypofunction: Secondary | ICD-10-CM

## 2018-01-26 DIAGNOSIS — I10 Essential (primary) hypertension: Secondary | ICD-10-CM

## 2018-01-26 DIAGNOSIS — M150 Primary generalized (osteo)arthritis: Secondary | ICD-10-CM | POA: Diagnosis not present

## 2018-01-26 DIAGNOSIS — M159 Polyosteoarthritis, unspecified: Secondary | ICD-10-CM

## 2018-01-26 DIAGNOSIS — I872 Venous insufficiency (chronic) (peripheral): Secondary | ICD-10-CM | POA: Diagnosis not present

## 2018-01-26 DIAGNOSIS — R609 Edema, unspecified: Secondary | ICD-10-CM | POA: Diagnosis not present

## 2018-01-26 MED ORDER — LOSARTAN POTASSIUM 100 MG PO TABS: 100.0000 mg | ORAL_TABLET | Freq: Every day | ORAL | 0 refills | Status: DC

## 2018-01-26 NOTE — Progress Notes (Signed)
Subjective:     Patient ID: Ryan [name], male   DOB: [birth year], 62 y.o.   MRN: [id number]  HPI 61 y/o WM here for a follow up ex-smoker, multifactorial dyspnea including mild restriction, OSA & morbid obesity-- see prob list below>>   ~  February 14, 2012:  17mo ROV & CPX (age56)> Ryan [name]'s CC is his knees, DJD & he is seeing an Orthopedist on [facility] 15mg /d as needed;  Unfortunately his weight is up a further 19# to 355# now in his 5'10" frame for a BMI=51; we discussed bariatric options but he is not interested, states he will "start" diet & continue swimming at the [place] per week...  We reviewed prob list, meds, xrays and labs> see below>>  CXR 4/13 showed norm heart size, clear lungs, NAD...  LABS 4/13:  FLP- at goals on Lip20;  Chems- wnl;  CBC- wnl;  TSH=1.90;  PSA=0.15;  UA=clear...  ~  August 17, 2012:  43mo ROV & Ryan [name] reports a good interval> he had left knee arthroscopic surg 8/13 by DrNorris for torn meniscus; did well w/ surg & post op rehab; exercise by swimming but unfortunately he has not lost any weight & we reviewed DIET/ EXERCISE/ weight reduction strategies...     He still smokes a few- [place]- and denies cough, sputum, SOB, etc;  BP remains OK on diet alone (low sodium) and measures 138/82 today w/o CP, palpit, SOB, edema, etc;  Similarly his Chol is OK on Lip20...    We reviewed prob list, meds, xrays and labs> see below for updates >> OK 2013 Flu vaccine today...  ~  April 13, 2013:  67mo ROV & Ryan [name] informs me that he is not taking his Lip20 as he feels it has caused knee pain (improved off this med); he wants to try Cres10- ok & f/u FLP on the Crestor later... We reviewed the following medical problems during today's office visit >>     Cig smoker> not smoking regularly, "only in casinos" & we discussed smoking cessation, avail meds etc...    HBP> on ASA325 & low sodium diet, no other BP meds; he knows the importance of wt reduction; BP= 140/98 & we will  add the Lasix20, low sodium, wt reduction=> told he will need additional meds if he doesn't get wt down!    Ven Insuffic> he knows to avoid sodium, elev legs, wear support hose; he requests Lasix20 prn edema- now asked to take one daily for this & BP.    CHOL> prev on Lip20- he stopped on his own due to knee pain (better off this med he says); FLP 6/14 on diet alone shows TChol 240, TG 226, HDL 51, LDL162; he wants to try Cres10-ok...    Obesity> weight is up to 365# w/ assoc incr BP, lipids, etc; everything depends on weight reduction; we reviewed diet, exercise, wt reduction strategies; offered dietary consult etc; he refuses to consider bariatric surg...    GI- Divertics, Polyps, Hems w/ rectal stenosis> on Prilosec20 prn; he is overdue for f/u colonoscopy- "They couldn't do it due to my weight >350#" but surely it can be done at the hosp & we will call GI to set this up...    NAFLD> he had Hep steatosis diagnosed in the 1990s by DrPatterson; fortunately his LFTs have been ok esp since he hasn't been able to lose any weight; labs 6/14 showed LFTs normal...    GU> on Cialis20 as needed.Marland KitchenMarland Kitchen  DJD> on Mobic15 prn; he has had bilat knee arthroscopies but when his knees started hurting he thought it was the Lip20 & stopped it...    Anxiety> see below- no requiring anxiolytic meds... We reviewed prob list, meds, xrays and labs> see below for updates >>   LABS 6/14:  FLP- not at goals off statin rx;  Chems- wnl;  CBC- wnl;  TSH=2.02;  PSA=0.20...  ~  January 11, 2014:  44mo ROV & unfortunately Ryan [name] has gained 10# further up to 374# & he understands the critically important need to lose the weight & get healthier;  He has no new complaints or concerns;  We reviewed the following medical problems during today's office visit >>     Cig smoker> not smoking regularly, "only in casinos" & we discussed smoking cessation, avail meds etc; not requiring breathing meds...    HBP> on CWC376 & low sodium diet, prn  Lasix20, no other BP meds; he knows the importance of wt reduction; BP= 130/84 & he denies HA, CP, palpit, SOB, edema...    Ven Insuffic> he knows to avoid sodium, elev legs, wear support hose; he requests Lasix20 prn edema- ok...    CHOL> prev on Lip20- he stopped on his own due to knee pain (better off this med he says); FLP 3/15 on diet alone shows TChol 242, TG 161, HDL 50, LDL160; he never started the Cres10=> do it NOW & recheck labs 82mo.    Obesity> weight is up to 374# w/ assoc incr BP, lipids, etc; everything depends on weight reduction; we reviewed diet, exercise, wt reduction strategies; offered dietary consult etc & he refused to consider Bariatric surg....    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    NAFLD> he had Hep steatosis diagnosed in the 1990s by DrPatterson; fortunately his LFTs have been ok esp since he hasn't been able to lose any weight; labs 3/15 showed LFTs normal...    GU> on Cialis20 as needed...     DJD> on Mobic15 prn; he has had bilat knee arthroscopies but when his knees started hurting he thought it was the Lip20 & stopped it...    Anxiety> see below- no requiring anxiolytic meds... We reviewed prob list, meds, xrays and labs> see below for updates >>   CXR 3/15> he forgot to go to [facility] for the needed film...  EKG 3/15 showed NSR, rate88, one PVC, otherw wnl...  LABS 3/15:  FLP- not at goals on diet alone, rec to start Cres10 & recheck 17mo;  Chems- wnl;  CBC- wnl;  TSH=2.25;  PSA=0.18...   ~  December 31, 2016:  60yr Ryan [name] disappeared after his 12/2013 CPX- not a single EPIC entry in the last 71yrs!  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 We updated his problem list as follows>>     R/O OSA> now up to >400 lbs, he wakes ~4am tired, no prob driving, naps 7/7 in afternoon, to bed by 8pm for 8H sleep, wakes x2 to urinate & feels that he sleeps well, denies snoring & sleeps on his side (wife passed away yrs ago).    Ex-Cig smoker> prev "only in casinos" & he says none for ~30yr now; we discussed importance of smoking cessation; he is not requiring breathing meds so far...    Borderline HBP> on ASA325 & low sodium diet, not on BP meds; he knows the importance of sodium restriction & wt reduction; BP= 138/74 & he denies HA, CP, palpit, dizzy, ch in edema...    Ven Insuffic> he knows to avoid sodium, elev legs, wear support hose; prev treated w/ Lasix20 prn edema...    CHOL> prev on Lip- he stopped on his own due to knee pain (better off this med he says); FLP 3/15 on diet alone shows TChol 242, TG 161, HDL 50, LDL160 and rec to take Cres10 (never did); Labs 3/18 showed TChol 263, TG 215, HDL 51, LDL 181 => discussed trial Atorva40 w/ f/u labs in several months...    Obesity> weight is up to 410# w/ assoc incr BP, lipids, etc; everything depends on weight reduction; we reviewed diet, exercise, wt reduction strategies; offered dietary consult etc & we discussed the option/ need for Bariatric surg.    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     NAFLD> he had Hep steatosis diagnosed in the 1990s by DrPatterson; fortunately his LFTs have been ok esp since he hasn't been able to lose any weight; labs 3/18 showed LFTs remain normal...    GU> prev on Cialis20 as needed for ED but he has symptoms of Low-T w/ decr libido, weakness, etc; Labs 3/18 showed Testos level = 174 (300-890) & we will recheck level & start topical product per protocol...    DJD> on Mobic15 prn; he has had bilat knee arthroscopies but when his knees started hurting he thought it was the Lip20 & stopped it, he's been told it's bone-on-bone..    Anxiety> see below- no requiring anxiolytic meds... EXAM shows Afeb, VSS w/ BP=138/74, P=104/reg, Wt=410#, 5'10"Tall, BMI=59; HEENT- neg, mallampati3; Chest- clear w/o w/r/r;  Heart- RR w/o m/r/g; Abd- obese w/ panniculus, soft nontender; Ext- VI 1+edema w/o c/c; Neuro- ok.   CXR 12/31/16 (independently reviewed by me in the PACS system) showed heart at upper lim of norm size, mild bronch wall thickening & bilat pleural thickening, otherw clear, NAD.Marland KitchenMarland Kitchen  EKG 12/31/16 showed STachy, rate 102, late transition, no acute changes...  Ambulatory Oximetry 12/31/16> O2sat=96% on RA at rest w/ pulse=106/min;  He ambulated 3 laps (185'ea) w/ lowest O2sat=94% w/ pulse=132/min;  We were unable to do Spirometry today- machine down.  LABS 12/31/16> FLP- not at goals on diet alone; Chems- ok x BS=108 (Cr=0.93 & LFTs wnl); CBC- wnl x WBC=12.0 w/ norm diff; TSH=4.10; PSA=0.20; Testos=174 (300-890)  2DEcho 01/20/17 showed mod conc LVH, norm LVF w/ EF=55-60% & no RWMA, Gr1DD, mildly dil AscAo at 74mm, norm AoV, MV-ok x triv MR, LA mod  dil at 11mm, RV & PAsys are wnl... IMP>>  His DYSPNEA is clearly multifactorial w/ components from morbid obesity, deconditioning, prob pulm restriction, ex-smoker, r/o OSA, r/o cardiac problems (LVH, Gr1DD, dil asc ao & LA);  If he is to improve his condition going forward he will definitely have to lose weight & in my opinion  Bariatric surg (poss gastric sleeve) is his only hope, although we reviewed DIET/ EXERCISE/ Wt reduction strategies... PLAN>>  Extensive w/u in progress-- we will start MetoprololER100 for LVH & AscAo at 52mm;  Start Atorva40 for Lipids;  Refill his Meloxicam15 prn arthritis pain;  But he understands that substantial improvement requires him to lose wt and inasmuch as he has been totally unable to lose wt x many yrs=> needs to consider Bariatric option as a life saving procedure!  Asked to call [facility] to start the process and learn more about what they can do for him... Note: sleep study, spirometry, lyme titers are pending  ~  February 04, 2017:  65mo ROV & Ryan [name] returns to review the above data and requests that we check LYME titer, he is also due for the 2nd Testos level to meet his insur company requirement for testos replacement therapy;  He has morbid obesity (wt down 3# to 407# today) but he has not called [facility] to inquire about their bariatric program;  His insurance company cancelled his in-lab PSA in favor of a Home Sleep Test=> done 02/04/17 w/ severe OSA being found=> now he needs an in-lab CPAP titration test  Due to nocturnal hypoxemia;  He has borderline HBP & mod concentric LVH & mildly dil asc ao at 29mm-- started  ASA325, MetoprololER100, & Lasix20 as needed for swelling that doesn't go down overnight;  He has hyperlipidemia w/ recent TChol=263, LDL=181-- rec to start Atorva40 + diet;  Finally he also has prescriptions for Prilosec20, Mobic15, and Cialis20    EXAM shows Afeb, VSS w/ BP=122/68, P=94/reg, Wt=407#, 5'10"Tall, BMI=59; HEENT- neg, mallampati3; Chest- clear w/o w/r/r;  Heart- RR w/o m/r/g; Abd- obese w/ panniculus, soft nontender; Ext- VI 1+edema w/o c/c; Neuro- ok.   Spirometry 02/04/17>  FVC=4.21 (87%), FEV1=3.46 (94%), %1sec=82%, mid-flows=119% predicted...   LABS 02/04/17>  Testos (2nd check required by [employer])=214 (300-890);  Lyme test (B.burgdorfi antibodies) =NEG  ((<0.90)  HOME SLEEP STUDY 02/04/17> c/w severe OSA, AHI=57.6/hr of sleep, plus O2desat to 66% & ave=89%, he needs to proceed to CPAP titration for Dx of severe OSA...  IMP/PLAN>>  I discussed w/ Ryan [name] the imperative to lose weight over the long term & the need for CPAP to treat his OSA & nocturnal hypoxemia=> proceed w/ in-lab CPAP titration test & mask fit session;  In the interim we have initiated rx w/ ASA, B-blocker, statin, & he should qualify for topical testos replacement therapy...   ~  August 11, 2017:  65mo ROV & Ryan [name] reports doing better- breathing better, getting about better, min cough/ sput, no hemoptysis, SOB/ DOE improved, no CP, no f/c/s;  He has NOT lost any wt & is up to 409# today...  He saw ORTHO at [facility] 06/25/17> discussed TKR but told he needs to lose wt, prev hx arthroscopy & shots...  We reviewed the following medical problems during today's office visit >>     R/O OSA> still >400 lbs, he wakes ~4am tired, no prob driving, naps 7/7 in afternoon, to bed by 8pm for Gibson sleep, wakes x2 to urinate & feels that he sleeps well,  denies snoring & sleeps on his side (wife passed away yrs ago).    Ex-Cig smoker> prev "only in casinos" & he says none for >34yr now; we discussed importance of smoking cessation; he is not requiring breathing meds so far...    HBP & Mod LVH on 2DEcho> on ASA325 & low sodium diet + MetoprololER100 & Lasix20; BP= 138/80 & he denies HA, CP, palpit, dizzy, ch in edema...    Ven Insuffic> he knows to avoid sodium, elev legs, wear support hose; continue Lasix20/d for edema & BP...    CHOL> prev on Lip- he stopped on his own due to knee pain (better off this med he says); FLP 3/15 on diet alone shows TChol 242, TG 161, HDL 50, LDL160 and rec to take Cres10 (never did); Labs 3/18 showed TChol 263, TG 215, HDL 51, LDL 181 => now on Atorva40 w/ f/u labs 10/18 within parameters...    Obesity> weight is 409# w/ assoc incr BP, lipids, etc; everything depends on  weight reduction; we reviewed diet, exercise, wt reduction strategies; offered dietary consult etc & we discussed the option/ need for Bariatric surg.    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    NAFLD> he had Hep steatosis diagnosed in the 1990s by DrPatterson; fortunately his LFTs have been ok esp since he hasn't been able to lose any weight; labs 3/18 showed LFTs remain normal...    GU> prev on Cialis20 as needed for ED but he has symptoms of Low-T w/ decr libido, weakness, etc; Labs 3/18 showed Testos level = 174 (300-890), recheck level= 214 & on Androgel 1% 4pumps/d...    DJD> on Mobic15 prn; he has had bilat knee arthroscopies but when his knees started hurting he thought it was the Lip20 & stopped it, he's been told it's bone-on-bone..    Anxiety> see below- no requiring anxiolytic meds... EXAM shows Afeb, VSS w/ BP=138/80, P=90/reg, Wt=407#, 5'10"Tall, BMI=59; HEENT- neg, mallampati3; Chest- clear w/o w/r/r;  Heart- RR w/o m/r/g; Abd- obese w/ panniculus, soft nontender; Ext- VI 1+edema w/o c/c; Neuro- ok.   LABS 08/11/17>  FLP- at goals on Lip40;  Chems- ok w/ LFTs wnl...  IMP/PLAN>>  Yochanan's FLP is much improved on the Atorva40; wt unfortunately w/o change & we reviewed diet+exercise; ok flu shot;  Continue meds & ROV in 41mo...  ~  December 17, 2017:  48mo ROV & add-on appt requested for HBP>  Ryan [name] went to Ortho for knee pain w/ XRays and cortisone shots; BP was noted to be elev in their office measuring 180/130 he says, then 170/120 on recheck;  He has hx HBP, morbid obesity w/ Wt~410# (BMI~59) and unable to lose wt despite his good efforts w/ diet 7 exercise; exercise lim by DJD knees & he tells me he started swimming 4d/wk recently... Pt also reports that he stopped taking his Atorva40 due  to dizziness, and stopped taking his BP med= MetoprololER 100mg  on his own; he has Lasix20 which he uses prn for edema due to his VI;  He is also on Testosterone gel (4 pumps rubbed into skin daily)... He denies CP, palpit, ch in SOB, 1+edema noted...    We reviewed the above PROB LIST...  LABS 07/2017 reviewed...  EXAM shows Afeb, VSS w/  BP=152/92, P=79/reg, Wt=410# (BMI=59); HEENT- neg, mallampati3; Chest- clear w/o w/r/r;  Heart- RR w/o m/r/g; Abd- obese w/ panniculus, soft nontender; Ext- VI 1+edema w/o c/c; Neuro- no focal deficits... IMP/PLAN>>  He has known HBP, mod LVH on 2DEcho & we reviewed low sodium weight reducing diet & incr exercise to facilitate wt reduction; restart METOPROLOL-ER 100mg /d + LASIX20 Qam and monitor BP at home if able, call for any symptoms/ problems and we will recheck in 6wks w/ FASTING blood work...    ~  January 26, 2018:  6wk ROV & add-on appt requested for persistent elev BP>  Ryan [name] re-started his Metop-ER100 & Lasix20 last OV & has been taking them regularly along w/ low sodium diet & exercise- swimming 4-5d per week;  He has lost 10# in the interim, likely fluid as most of his edema is resolved (he has VI & trace fluid now);  Despite this improvement his home BP checks have been running 160-17-/ 110-120 he says- he notes some AM headaches (across his temples & Rx w/ Tylenol w/ improvement), no CP/ palpit, SOB is improved from prev, sl edema persists... He tells me that he rests well, denies daytime sleepiness, notes good energy & not napping at all now (0/7), no issues w/ driving etc & says he is not snoring per his girlfriend...     We reviewed the above PROB LIST...  LABS 07/2017 reviewed...  EXAM shows Afeb, VSS w/ BP=168/86, P=93/reg, Wt=400# (BMI=58); HEENT- neg, mallampati3; Chest- clear w/o w/r/r;  Heart- RR w/o m/r/g; Abd- obese w/ panniculus, soft nontender; Ext- VI tr+edema w/o c/c; Neuro- no focal deficits... IMP/PLAN>>  We decided to add LOSARTAN100 to his  MetoprololER100 & Lasix20; aske to keep up the good work w/ low carb, low fat weight reducing diet & low sodium mas well... We plan ROV recheck in 80mo & bring home BP cuff for comparison...           Problem List:   Severe OSA> on Home Sleep Study 02/04/17> c/w severe OSA, AHI=57.6/hr of sleep, plus O2desat to 66% & ave=89%, he needs to proceed to CPAP titration for Dx of severe OSA=> pt cancelled twice;  NOTE> still >400 lbs, he wakes ~4am tired, no prob driving, naps 7/7 in afternoon, to bed by 8pm for 8H sleep, wakes x2 to urinate & feels that he sleeps well, denies snoring & sleeps on his side (wife passed away yrs ago)... We have offered AUTO CPAP trial and Sleep Med consult but he declines intervention...   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 ~  3/18>  Pt indicates that he hasn't smoked in ~51yr...  HYPERTENSION/ Mod LVH on 2DEcho - prev on Metoprolol & Accupril but he stopped these on his own when he got serious w/ diet & exercise...  ~  8/10:  weight is down 55# to 299# in 2010 and BP= 130/90 which was better... he is reluctant to restart meds, so we decided on home BP monitoring w/ goal <140/85... ~  10/11:  weight back up to 336# & BP= 142/92, but he doesn't want to restart meds stating that he knows he can improve BP by getting hias weight down w/ diet + exercise... ~  4/13:  Weight up further to 355# & BP= 152/84; denies CP, palpit, SOB, edema; desperately needs to  lose the weight or start back on BP meds for cardioprotection. ~  10/13:  BP= 138/82 and he denies symptoms; weight remains elev at 358# & we reviewed the importance of weight reduction... ~  6/14:  on ASA325 & low sodium diet, no other BP meds; he knows the importance of wt reduction; BP= 140/98 & we will add the Lasix20, low sodium, wt reduction=>  told he will need additional meds if he doesn't get wt down! ~  3/15:  on ASA325 & low sodium diet, prn Lasix20, no other BP meds; he knows the importance of wt reduction; BP= 130/84 & he denies HA, CP, palpit, SOB, edema... ~  3/18>  Pt weighs 410#, BP=138/74 but 2DEcho shows mod LVH, dil AscAo at 23mm and LAE at 78mm; Rec to start METOPROLO-ER100 daily...  VENOUS INSUFFICIENCY (ICD-459.81) - he has chr ven inuffic & edema... he knows to elim sodium, elevate legs, wear support hose... prev on Lasix20  but off all meds during the last yr or so... ~  6/14:  he knows to avoid sodium, elev legs, wear support hose; he requests Lasix20 prn edema- ok...   HYPERCHOLESTEROLEMIA (ICD-272.0) - prev on LIPITOR 20mg /d + diet efforts... ~  [place] 7/99 on diet showed TChol 256, TG 342, HDL 47, LDL 141... rec> start Carpenter. ~  [place] 3/04 on Lip10 showed TChol 156, TG 149, HDL 42, LDL 85 ~  FLP 8/07 on Lip10 showed TChol 158, TG 133, HDL 43, LDL 89 ~  FLP 8/10 on diet showed TChol 213, TG 164, HDL 51, LDL 139... he wants to continue diet Rx. ~  FLP 10/11 on diet showed TChol 206, TG 111, HDL 46, LDL 139... rec> start LIPITOR 20mg /d. ~  FLP 4/13 on Lip20 showed TChol 164, TG 127, HDL 51, LDL 87 ~  FLP 6/14 off Lip20 now showed TChol 240, TG 226, HDL 51, LDL162  ~  FLP 3/15 on diet alone showed TChol 242, TG 161, HDL 50, LDL160  ~  FLP 3/18 on diet alone shows TChol 263, TG 215, HDL 51, LDL 181 => discussed trial Atorva40 w/ f/u labs in several months...  OBESITY (ICD-278.00) - best weight per old chart = 277# in 1990, and worst weight = 355# in 2007... we have had numerous conversations about diet + exercise strategies for weight reduction... 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 ~  he had FlexSig 1991 by DrPatterson w/ int hems and Rx w/ Anusol... 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 f/u planned 46yrs. ~  He is sched to have his f/u colonoscopy 12/13 per DrPatterson=> he has not had this done yet. ~  He cancelled 2015 appt w/ DrGessner... ~  3/18>  He needs OV w/ GI to review prev hx & sched his f/u colonoscopy...  Hx of FATTY LIVER DISEASE (ICD-571.8) - hx elevated LFT's in the 90's believed related to hepatic  steatosis w/ eval by DrPatterson in 1990/01/27... he drinks beer on weekends & advised to quit... LFT's have been normal in recent yrs. ~  4/13:  LFTs remain WNL despite weight gain to 355# ~  6/14:  LFTs remain WNL despite wt gain to 365# ~  3/15:  LFTs remain wnl despite his weight of 374# ~  3/18>  Fortunately his LFTs remain wnl despite wt gain to 410#  DEGENERATIVE JOINT DISEASE (ICD-715.90) - he uses OTC analgesics as needed... ~  s/p right knee arthroscopy 2/05 by DrGioffre for meniscus tear... ~  s/p left knee arthroscopy 8/13 by drNorris for torn meniscus... ~  He says he was old that his knees are bone-on-bone...  ANXIETY (ICD-300.00) - pt's wife died 01-27-02 from breast cancer- 2 daughters & one has grad from [employer]...  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   Past Surgical History:  Procedure Laterality Date  . COLONOSCOPY W/ POLYPECTOMY  06/27/2009  . FLEXIBLE SIGMOIDOSCOPY    . left knee arthroscopy  05/24/2012   torn meniscus   . right knee arthroscopy  11/2003   Dr. Gladstone Lighter    Outpatient Encounter Medications as of 01/26/2018  Medication Sig  . aspirin 325 MG tablet Take 325 mg by mouth daily.  Marland Kitchen atorvastatin (LIPITOR) 40 MG tablet Take 1 tablet (40 mg total)  by mouth daily.  Marland Kitchen CIALIS 20 MG tablet TAKE 1 TABLET DAILY AS NEEDED FOR ERECTILE DYSFUNCTION  . furosemide (LASIX) 20 MG tablet TAKE 1 TABLET DAILY EVERY MORNING  . metoprolol succinate (TOPROL-XL) 100 MG 24 hr tablet Take 1 tablet (100 mg total) by mouth daily. Take with or immediately following a meal.  . Naproxen Sodium (ALEVE) 220 MG CAPS Take by mouth.  Marland Kitchen omeprazole (PRILOSEC OTC) 20 MG tablet Take 20 mg by mouth daily. 30 minutes before a meal AS NEEDED  . Testosterone 12.5 MG/ACT (1%) GEL 4 pumps rubbing into skin daily in the morning  . [DISCONTINUED] meloxicam (MOBIC) 15 MG tablet Take 1 tablet (15 mg total) by mouth daily.   No facility-administered encounter medications on file as of 01/26/2018.     No Known Allergies     Immunization History  Administered Date(s) Administered  . Influenza Split 08/17/2012  . Influenza Whole 08/19/2010  . Influenza,inj,Quad PF,6+ Mos 08/11/2017     Current Medications, Allergies, Past Medical History, Past Surgical History, Family History, and Social History were reviewed in [employer] record.   Review of Systems       The patient denies fever, chills, sweats, anorexia, fatigue, weakness, malaise, weight loss, sleep disorder, blurring, diplopia, eye irritation, eye discharge, vision loss, eye pain, photophobia, earache, ear discharge, tinnitus, decreased hearing, nasal congestion, nosebleeds, sore throat, hoarseness, chest pain, palpitations, syncope, dyspnea on exertion, orthopnea, PND, peripheral edema, cough, dyspnea at rest, excessive sputum, hemoptysis, wheezing, pleurisy, nausea, vomiting, diarrhea, constipation, change in bowel habits, abdominal pain, melena, hematochezia, jaundice, gas/bloating, indigestion/heartburn, dysphagia, odynophagia, dysuria, hematuria, urinary frequency, urinary hesitancy, nocturia, incontinence, back pain, joint pain, joint swelling, muscle cramps, muscle weakness, stiffness, arthritis,  sciatica, restless legs, leg pain at night, leg pain with exertion, itching, dryness, suspicious lesions, paralysis, paresthesias, seizures, tremors, vertigo, transient blindness, frequent falls, frequent headaches, difficulty walking, depression, anxiety, memory loss, confusion, cold intolerance, heat intolerance, polydipsia, polyphagia, polyuria, unusual weight change, abnormal bruising, bleeding, enlarged lymph nodes, urticaria, allergic rash, hay fever, and recurrent infections.     Objective:   Physical Exam  WD, Obese, 62 y/o WM in NAD... GENERAL:  Alert & oriented; pleasant & cooperative... HEENT:  [place]/AT, EOM-wnl, PERRLA, Fundi-benign, EACs-clear, TMs-wnl, NOSE-clear, THROAT-clear & wnl. NECK:  Supple w/ full ROM; no JVD; normal carotid impulses w/o bruits; no thyromegaly or nodules palpated; no lymphadenopathy. CHEST:  Clear to P & A; without wheezes/ rales/ or rhonchi. HEART:  Regular Rhythm; without murmurs/ rubs/ or gallops. ABDOMEN:  Obese, soft & nontender; normal bowel sounds; no organomegaly or masses detected. (RECTAL:  +rectal stricture, tender, can't reach prostate, stool heme neg) EXT: without deformities or arthritic changes; no varicose veins/ venous insuffic/ or edema. NEURO:  CN's intact; motor testing normal; sensory testing normal; gait normal & balance OK. DERM:  No lesions noted; no rash etc...  RADIOLOGY DATA:  Reviewed in the EPIC EMR & discussed w/ the patient...  LABORATORY DATA:  Reviewed in the EPIC EMR & discussed w/ the patient...   Assessment:      12/31/16>   His DYSPNEA is clearly multifactorial w/ components from morbid obesity, deconditioning, prob pulm restriction, ex-smoker, r/o OSA, r/o cardiac problems (LVH, Gr1DD, dil asc ao & LA);  If he is to improve his condition going forward he will definitely have to lose weight & in my opinion Bariatric surg (poss gastric sleeve) is his only hope, although we reviewed DIET/ EXERCISE/ Wt reduction  strategies... PLAN>>  Extensive w/u in progress-- we will start MetoprololER100 for LVH & AscAo at 17mm;  Start Atorva40 for Lipids;  Refill his Meloxicam15 prn arthritis pain;  But he understands that substantial improvement requires him to lose wt and inasmuch as he has been totally unable to lose wt x many yrs=> needs to consider Bariatric option as a life saving procedure!  Asked to call [facility] to start the process and learn more about what they can do for him. 02/04/17>   I discussed w/ Ryan [name] the imperative to lose weight over the long term & the need for CPAP to treat his OSA & nocturnal hypoxemia=> proceed w/ in-lab CPAP titration test & mask fit session;  In the interim we have initiated rx w/ ASA, B-blocker, statin, & he should qualify for topical testos replacement therapy...  08/11/17>   Ryan [name]'s FLP is much improved on the Atorva40; wt unfortunately w/o change & we reviewed diet+exercise; ok flu shot;  Continue meds & ROV in 88mo... 12/17/17>   We reviewed low sodium weight reducing diet & incr exercise to facilitate wt reduction; restart METOPROLOL-ER 100mg /d + LASIX20 Qam and monitor BP at home if able, call for any symptoms/ problems and we will recheck in 6wks w/ FASTING blood work 01/26/18>   We decided to add LOSARTAN100 to his MetoprololER100 & Lasix20; aske to keep up the good work w/ low carb, low fat weight reducing diet & low sodium mas well... We plan ROV recheck in 4mo & bring home BP cuff for comparison   MORBID OBESITY>> wt was up to 410 lbs w/ assoc w/ DOE => we reviewed the imperative for weight reduction & I have again rec that he consider Bariatric surg as a life saving procedure for him (he declines)... 01/26/18>  He's lost 10# down to 400# today- keep up the good work...  Severe OSA> on Home Sleep Study 02/04/17> c/w severe OSA, AHI=57.6/hr of sleep, plus O2desat to 66% & ave=89%, he needs to proceed to CPAP titration for Dx of severe OSA=> pt cancelled twice;  NOTE> still >400 lbs,  he wakes ~4am tired, no prob driving,  naps 7/7 in afternoon, to bed by 8pm for 8H sleep, wakes x2 to urinate & feels that he sleeps well, denies snoring & sleeps on his side (wife passed away yrs ago)... We have offered AUTO CPAP trial and Sleep Med consult but he declines intervention...  01/26/18>  He claims that he is fine now- resting well, wakes refreshed, no daytime sleepiness & not requiring naps etc; furthermore he says girlfriend says he is not snoring & resting satis; I indicated to him that it is unlikely that his severe OSA would disappear on it's own & the only way to tell if he is still at risk is to recheck a sleep study but he refuses...  HBP>  Borderline BP on "diet" alone; reviewed need for no salt & weight reduction=> 2DEcho 3/18 shows mod LVH, dilated AscAo at 20mm & Gr1DD => added Metoprolol100/d...  Ven Insuffic>  He knows to elim sodium, elev legs, wear support hose, etc...  CHOL>  Prev on Lip20 but he stopped on his own due to knee pain; FLP was way off & he wanted to try Cres10 but never filled the Rx;  12/2016 rec Atorva40 to start now..  GI> Divertics, Polyps, Hems, rectal stricture>  He uses OTC Prilosec prn; he has rectal stricture & needs to maintain soft stool due to narrow caliber; neg for blood but due for colonoscopy per DrPatterson soon...  Fatty Liver Dis>  LFTs remain normal despite weight gain...  DJD>  Aware- followed by Ortho on Mobic & we discussed "wear & tear" joints, his weight, etc...     Plan:     Patient's Medications  New Prescriptions   LOSARTAN (COZAAR) 100 MG TABLET    Take 1 tablet (100 mg total) by mouth daily.  Previous Medications   ASPIRIN 325 MG TABLET    Take 325 mg by mouth daily.   ATORVASTATIN (LIPITOR) 40 MG TABLET    Take 1 tablet (40 mg total) by mouth daily.   CIALIS 20 MG TABLET    TAKE 1 TABLET DAILY AS NEEDED FOR ERECTILE DYSFUNCTION   FUROSEMIDE (LASIX) 20 MG TABLET    TAKE 1 TABLET DAILY EVERY MORNING   METOPROLOL SUCCINATE  (TOPROL-XL) 100 MG 24 HR TABLET    Take 1 tablet (100 mg total) by mouth daily. Take with or immediately following a meal.   NAPROXEN SODIUM (ALEVE) 220 MG CAPS    Take by mouth.   OMEPRAZOLE (PRILOSEC OTC) 20 MG TABLET    Take 20 mg by mouth daily. 30 minutes before a meal AS NEEDED   TESTOSTERONE 12.5 MG/ACT (1%) GEL    4 pumps rubbing into skin daily in the morning  Modified Medications   No medications on file  Discontinued Medications   MELOXICAM (MOBIC) 15 MG TABLET    Take 1 tablet (15 mg total) by mouth daily.

## 2018-01-26 NOTE — Patient Instructions (Signed)
Today we updated your med list in our EPIC system...    Continue your current medications the same...  We decided to add-in a new BP med = LOSARTAN 100 mg daily and continue the Metoprolol & Lasix the same...  Watch your BP twice daily & record the results for [place] to review...  Keep your prev scheduled follow up visit at which time we will follow up your fasting blood work etc..Marland Kitchen

## 2018-02-09 ENCOUNTER — Encounter: Payer: Self-pay | Admitting: Pulmonary Disease

## 2018-02-09 ENCOUNTER — Ambulatory Visit (INDEPENDENT_AMBULATORY_CARE_PROVIDER_SITE_OTHER): Payer: Managed Care, Other (non HMO) | Admitting: Pulmonary Disease

## 2018-02-09 ENCOUNTER — Other Ambulatory Visit (INDEPENDENT_AMBULATORY_CARE_PROVIDER_SITE_OTHER): Payer: Managed Care, Other (non HMO)

## 2018-02-09 VITALS — BP 166/98 | HR 82 | Temp 98.0°F | Ht 70.0 in | Wt >= 6400 oz

## 2018-02-09 DIAGNOSIS — Z0000 Encounter for general adult medical examination without abnormal findings: Secondary | ICD-10-CM

## 2018-02-09 DIAGNOSIS — I872 Venous insufficiency (chronic) (peripheral): Secondary | ICD-10-CM | POA: Diagnosis not present

## 2018-02-09 DIAGNOSIS — G4733 Obstructive sleep apnea (adult) (pediatric): Secondary | ICD-10-CM

## 2018-02-09 DIAGNOSIS — M150 Primary generalized (osteo)arthritis: Secondary | ICD-10-CM | POA: Diagnosis not present

## 2018-02-09 DIAGNOSIS — E291 Testicular hypofunction: Secondary | ICD-10-CM

## 2018-02-09 DIAGNOSIS — M159 Polyosteoarthritis, unspecified: Secondary | ICD-10-CM

## 2018-02-09 DIAGNOSIS — R609 Edema, unspecified: Secondary | ICD-10-CM | POA: Diagnosis not present

## 2018-02-09 DIAGNOSIS — I10 Essential (primary) hypertension: Secondary | ICD-10-CM | POA: Diagnosis not present

## 2018-02-09 LAB — PSA: PSA: 0.18 ng/mL (ref 0.10–4.00)

## 2018-02-09 LAB — COMPREHENSIVE METABOLIC PANEL
ALT: 26 U/L (ref 0–53)
AST: 18 U/L (ref 0–37)
Albumin: 4.4 g/dL (ref 3.5–5.2)
Alkaline Phosphatase: 86 U/L (ref 39–117)
BILIRUBIN TOTAL: 0.7 mg/dL (ref 0.2–1.2)
BUN: 15 mg/dL (ref 6–23)
CALCIUM: 9.7 mg/dL (ref 8.4–10.5)
CO2: 32 meq/L (ref 19–32)
Chloride: 97 mEq/L (ref 96–112)
Creatinine, Ser: 0.92 mg/dL (ref 0.40–1.50)
GFR: 89.21 mL/min (ref >60.00)
Glucose, Bld: 100 mg/dL — ABNORMAL HIGH (ref 70–99)
POTASSIUM: 4.6 meq/L (ref 3.5–5.1)
Sodium: 138 mEq/L (ref 135–145)
Total Protein: 6.7 g/dL (ref 6.0–8.3)

## 2018-02-09 LAB — CBC WITH DIFFERENTIAL/PLATELET
BASOS ABS: 0.1 10*3/uL (ref 0.0–0.1)
Basophils Relative: 0.5 % (ref 0.0–3.0)
EOS PCT: 0.8 % (ref 0.0–5.0)
Eosinophils Absolute: 0.1 10*3/uL (ref 0.0–0.7)
HEMATOCRIT: 45 % (ref 39.0–52.0)
HEMOGLOBIN: 15.2 g/dL (ref 13.0–17.0)
Lymphocytes Relative: 18.2 % (ref 12.0–46.0)
Lymphs Abs: 2.3 10*3/uL (ref 0.7–4.0)
MCHC: 33.7 g/dL (ref 30.0–36.0)
MCV: 88.8 fl (ref 78.0–100.0)
MONOS PCT: 7.3 % (ref 3.0–12.0)
Monocytes Absolute: 0.9 10*3/uL (ref 0.1–1.0)
NEUTROS PCT: 73.2 % (ref 43.0–77.0)
Neutro Abs: 9.1 10*3/uL — ABNORMAL HIGH (ref 1.4–7.7)
Platelets: 246 10*3/uL (ref 150.0–400.0)
RBC: 5.07 Mil/uL (ref 4.22–5.81)
RDW: 15.6 % — ABNORMAL HIGH (ref 11.5–15.5)
WBC: 12.5 10*3/uL — AB (ref 4.0–10.5)

## 2018-02-09 LAB — LIPID PANEL
CHOL/HDL RATIO: 3
Cholesterol: 162 mg/dL (ref 0–200)
HDL: 59.4 mg/dL (ref >39.00)
LDL CALC: 64 mg/dL (ref 0–99)
NONHDL: 102.74
TRIGLYCERIDES: 196 mg/dL — AB (ref 0.0–149.0)
VLDL: 39.2 mg/dL (ref 0.0–40.0)

## 2018-02-09 LAB — TSH: TSH: 4.11 u[IU]/mL (ref 0.35–4.50)

## 2018-02-09 MED ORDER — FUROSEMIDE 40 MG PO TABS: 40.0000 mg | ORAL_TABLET | Freq: Every day | ORAL | 2 refills | Status: DC

## 2018-02-09 NOTE — Progress Notes (Addendum)
Subjective:     Patient ID: Ryan [name], male   DOB: [birth year], 62 y.o.   MRN: [id number]  HPI 62 y/o WM here for a follow up ex-smoker, multifactorial dyspnea including mild restriction, OSA & morbid obesity-- see prob list below>>   ~  February 14, 2012:  36mo ROV & CPX (age56)> [facility] CC is his knees, DJD & he is seeing an Orthopedist on [facility] 15mg /d as needed;  Unfortunately his weight is up a further 19# to 355# now in his 5'10" frame for a BMI=51; we discussed bariatric options but he is not interested, states he will "start" diet & continue swimming at the [place] per week...  We reviewed prob list, meds, xrays and labs> see below>>  CXR 4/13 showed norm heart size, clear lungs, NAD...  LABS 4/13:  FLP- at goals on Lip20;  Chems- wnl;  CBC- wnl;  TSH=1.90;  PSA=0.15;  UA=clear...  ~  August 17, 2012:  52mo ROV & Ryan [name] reports a good interval> he had left knee arthroscopic surg 8/13 by DrNorris for torn meniscus; did well w/ surg & post op rehab; exercise by swimming but unfortunately he has not lost any weight & we reviewed DIET/ EXERCISE/ weight reduction strategies...     He still smokes a few- but [place] at the casino- and denies cough, sputum, SOB, etc;  BP remains OK on diet alone (low sodium) and measures 138/82 today w/o CP, palpit, SOB, edema, etc;  Similarly his Chol is OK on Lip20...    We reviewed prob list, meds, xrays and labs> see below for updates >> OK 2013 Flu vaccine today...  ~  April 13, 2013:  78mo ROV & Ryan [name] informs me that he is not taking his Lip20 as he feels it has caused knee pain (improved off this med); he wants to try Cres10- ok & f/u FLP on the Crestor later... We reviewed the following medical problems during today's office visit >>     Cig smoker> not smoking regularly, "only in casinos" & we discussed smoking cessation, avail meds etc...    HBP> on ASA325 & low sodium diet, no other BP meds; he knows the importance of wt reduction; BP= 140/98 & we will  add the Lasix20, low sodium, wt reduction=> told he will need additional meds if he doesn't get wt down!    Ven Insuffic> he knows to avoid sodium, elev legs, wear support hose; he requests Lasix20 prn edema- now asked to take one daily for this & BP.    CHOL> prev on Lip20- he stopped on his own due to knee pain (better off this med he says); FLP 6/14 on diet alone shows TChol 240, TG 226, HDL 51, LDL162; he wants to try Cres10-ok...    Obesity> weight is up to 365# w/ assoc incr BP, lipids, etc; everything depends on weight reduction; we reviewed diet, exercise, wt reduction strategies; offered dietary consult etc; he refuses to consider bariatric surg...    GI- Divertics, Polyps, Hems w/ rectal stenosis> on Prilosec20 prn; he is overdue for f/u colonoscopy- "They couldn't do it due to my weight >350#" but surely it can be done at the hosp & we will call GI to set this up...    NAFLD> he had Hep steatosis diagnosed in the 1990s by DrPatterson; fortunately his LFTs have been ok esp since he hasn't been able to lose any weight; labs 6/14 showed LFTs normal...    GU> on Cialis20 as needed.Marland KitchenMarland Kitchen  DJD> on Mobic15 prn; he has had bilat knee arthroscopies but when his knees started hurting he thought it was the Lip20 & stopped it...    Anxiety> see below- no requiring anxiolytic meds... We reviewed prob list, meds, xrays and labs> see below for updates >>   LABS 6/14:  FLP- not at goals off statin rx;  Chems- wnl;  CBC- wnl;  TSH=2.02;  PSA=0.20...  ~  January 11, 2014:  35mo ROV & unfortunately Ryan [name] has gained 10# further up to 374# & he understands the critically important need to lose the weight & get healthier;  He has no new complaints or concerns;  We reviewed the following medical problems during today's office visit >>     Cig smoker> not smoking regularly, "only in casinos" & we discussed smoking cessation, avail meds etc; not requiring breathing meds...    HBP> on [id number] & low sodium diet, prn  Lasix20, no other BP meds; he knows the importance of wt reduction; BP= 130/84 & he denies HA, CP, palpit, SOB, edema...    Ven Insuffic> he knows to avoid sodium, elev legs, wear support hose; he requests Lasix20 prn edema- ok...    CHOL> prev on Lip20- he stopped on his own due to knee pain (better off this med he says); FLP 3/15 on diet alone shows TChol 242, TG 161, HDL 50, LDL160; he never started the Cres10=> do it NOW & recheck labs 99mo.    Obesity> weight is up to 374# w/ assoc incr BP, lipids, etc; everything depends on weight reduction; we reviewed diet, exercise, wt reduction strategies; offered dietary consult etc & he refused to consider Bariatric surg....    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    NAFLD> he had Hep steatosis diagnosed in the 1990s by DrPatterson; fortunately his LFTs have been ok esp since he hasn't been able to lose any weight; labs 3/15 showed LFTs normal...    GU> on Cialis20 as needed...     DJD> on Mobic15 prn; he has had bilat knee arthroscopies but when his knees started hurting he thought it was the Lip20 & stopped it...    Anxiety> see below- no requiring anxiolytic meds... We reviewed prob list, meds, xrays and labs> see below for updates >>   CXR 3/15> he forgot to go to [facility] the needed film...  EKG 3/15 showed NSR, rate88, one PVC, otherw wnl...  LABS 3/15:  FLP- not at goals on diet alone, rec to start Cres10 & recheck 79mo;  Chems- wnl;  CBC- wnl;  TSH=2.25;  PSA=0.18...   ~  December 31, 2016:  52yr [place] disappeared after his 12/2013 CPX- not a single EPIC entry in the last 73yrs!  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 We updated his problem list as follows>>     R/O OSA> now up to >400 lbs, he wakes ~4am tired, no prob driving, naps 7/7 in afternoon, to bed by 8pm for 8H sleep, wakes x2 to urinate & feels that he sleeps well, denies snoring & sleeps on his side (wife passed away yrs ago).    Ex-Cig smoker> prev "only in casinos" & he says none for ~64yr now; we discussed importance of smoking cessation; he is not requiring breathing meds so far...    Borderline HBP> on ASA325 & low sodium diet, not on BP meds; he knows the importance of sodium restriction & wt reduction; BP= 138/74 & he denies HA, CP, palpit, dizzy, ch in edema...    Ven Insuffic> he knows to avoid sodium, elev legs, wear support hose; prev treated w/ Lasix20 prn edema...    CHOL> prev on Lip- he stopped on his own due to knee pain (better off this med he says); FLP 3/15 on diet alone shows TChol 242, TG 161, HDL 50, LDL160 and rec to take Cres10 (never did); Labs 3/18 showed TChol 263, TG 215, HDL 51, LDL 181 => discussed trial Atorva40 w/ f/u labs in several months...    Obesity> weight is up to 410# w/ assoc incr BP, lipids, etc; everything depends on weight reduction; we reviewed diet, exercise, wt reduction strategies; offered dietary consult etc & we discussed the option/ need for Bariatric surg.    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     NAFLD> he had Hep steatosis diagnosed in the 1990s by DrPatterson; fortunately his LFTs have been ok esp since he hasn't been able to lose any weight; labs 3/18 showed LFTs remain normal...    GU> prev on Cialis20 as needed for ED but he has symptoms of Low-T w/ decr libido, weakness, etc; Labs 3/18 showed Testos level = 174 (300-890) & we will recheck level & start topical product per protocol...    DJD> on Mobic15 prn; he has had bilat knee arthroscopies but when his knees started hurting he thought it was the Lip20 & stopped it, he's been told it's bone-on-bone..    Anxiety> see below- no requiring anxiolytic meds... EXAM shows Afeb, VSS w/ BP=138/74, P=104/reg, Wt=410#, 5'10"Tall, BMI=59; HEENT- neg, mallampati3; Chest- clear w/o w/r/r;  Heart- RR w/o m/r/g; Abd- obese w/ panniculus, soft nontender; Ext- VI 1+edema w/o c/c; Neuro- ok.   CXR 12/31/16 (independently reviewed by me in the PACS system) showed heart at upper lim of norm size, mild bronch wall thickening & bilat pleural thickening, otherw clear, NAD.Marland KitchenMarland Kitchen  EKG 12/31/16 showed STachy, rate 102, late transition, no acute changes...  Ambulatory Oximetry 12/31/16> O2sat=96% on RA at rest w/ pulse=106/min;  He ambulated 3 laps (185'ea) w/ lowest O2sat=94% w/ pulse=132/min;  We were unable to do Spirometry today- machine down.  LABS 12/31/16> FLP- not at goals on diet alone; Chems- ok x BS=108 (Cr=0.93 & LFTs wnl); CBC- wnl x WBC=12.0 w/ norm diff; TSH=4.10; PSA=0.20; Testos=174 (300-890)  2DEcho 01/20/17 showed mod conc LVH, norm LVF w/ EF=55-60% & no RWMA, Gr1DD, mildly dil AscAo at 64mm, norm AoV, MV-ok x triv MR, LA mod  dil at 2mm, RV & PAsys are wnl... IMP>>  His DYSPNEA is clearly multifactorial w/ components from morbid obesity, deconditioning, prob pulm restriction, ex-smoker, r/o OSA, r/o cardiac problems (LVH, Gr1DD, dil asc ao & LA);  If he is to improve his condition going forward he will definitely have to lose weight & in my opinion  Bariatric surg (poss gastric sleeve) is his only hope, although we reviewed DIET/ EXERCISE/ Wt reduction strategies... PLAN>>  Extensive w/u in progress-- we will start MetoprololER100 for LVH & AscAo at 72mm;  Start Atorva40 for Lipids;  Refill his Meloxicam15 prn arthritis pain;  But he understands that substantial improvement requires him to lose wt and inasmuch as he has been totally unable to lose wt x many yrs=> needs to consider Bariatric option as a life saving procedure!  Asked to call [facility] to start the process and learn more about what they can do for him... Note: sleep study, spirometry, lyme titers are pending  ~  February 04, 2017:  71mo ROV & Ryan [name] returns to review the above data and requests that we check LYME titer, he is also due for the 2nd Testos level to meet his insur company requirement for testos replacement therapy;  He has morbid obesity (wt down 3# to 407# today) but he has not called [facility] to inquire about their bariatric program;  His insurance company cancelled his in-lab PSA in favor of a Home Sleep Test=> done 02/04/17 w/ severe OSA being found=> now he needs an in-lab CPAP titration test  Due to nocturnal hypoxemia;  He has borderline HBP & mod concentric LVH & mildly dil asc ao at 76mm-- started  ASA325, MetoprololER100, & Lasix20 as needed for swelling that doesn't go down overnight;  He has hyperlipidemia w/ recent TChol=263, LDL=181-- rec to start Atorva40 + diet;  Finally he also has prescriptions for Prilosec20, Mobic15, and Cialis20    EXAM shows Afeb, VSS w/ BP=122/68, P=94/reg, Wt=407#, 5'10"Tall, BMI=59; HEENT- neg, mallampati3; Chest- clear w/o w/r/r;  Heart- RR w/o m/r/g; Abd- obese w/ panniculus, soft nontender; Ext- VI 1+edema w/o c/c; Neuro- ok.   Spirometry 02/04/17>  FVC=4.21 (87%), FEV1=3.46 (94%), %1sec=82%, mid-flows=119% predicted...   LABS 02/04/17>  Testos (2nd check required by [employer])=214 (300-890);  Lyme test (B.burgdorfi antibodies) =NEG  ((<0.90)  HOME SLEEP STUDY 02/04/17> c/w severe OSA, AHI=57.6/hr of sleep, plus O2desat to 66% & ave=89%, he needs to proceed to CPAP titration for Dx of severe OSA...  IMP/PLAN>>  I discussed w/ Ryan [name] the imperative to lose weight over the long term & the need for CPAP to treat his OSA & nocturnal hypoxemia=> proceed w/ in-lab CPAP titration test & mask fit session;  In the interim we have initiated rx w/ ASA, B-blocker, statin, & he should qualify for topical testos replacement therapy...   ~  August 11, 2017:  80mo ROV & Ryan [name] reports doing better- breathing better, getting about better, min cough/ sput, no hemoptysis, SOB/ DOE improved, no CP, no f/c/s;  He has NOT lost any wt & is up to 409# today...  He saw ORTHO at [facility] 06/25/17> discussed TKR but told he needs to lose wt, prev hx arthroscopy & shots...  We reviewed the following medical problems during today's office visit >>     +OSA> still >400 lbs, he wakes ~4am tired, no prob driving, naps 7/7 in afternoon, to bed by 8pm for [place] sleep, wakes x2 to urinate & feels that he sleeps well, denies  snoring & sleeps on his side (wife passed away yrs ago). Home Sleep Study 01/2017 showed AHI=57/hr & O2desat to 66% w/ ave 89% => needs CPAP titration & mask fit session but pt has refused further eval, refuses CPAP, warned about nocturnal hypoxemia & refuses nocturnal O2 as well...     Ex-Cig smoker> prev "only in casinos" & he says none for >49yr now; we discussed importance of smoking cessation; he is not requiring breathing meds so far...    HBP & Mod LVH on 2DEcho> on ASA325 & low sodium diet + MetoprololER100 & Lasix20; BP= 138/80 & he denies HA, CP, palpit, dizzy, ch in edema...    Ven Insuffic, edema> he knows to avoid sodium, elev legs, wear support hose; continue Lasix20/d for edema & BP...    CHOL> prev on Lip- he stopped on his own due to knee pain (better off this med he says); FLP 3/15 on diet alone shows TChol 242, TG 161, HDL 50, LDL160  and rec to take Cres10 (never did); Labs 3/18 showed TChol 263, TG 215, HDL 51, LDL 181 => now on Atorva40 w/ f/u labs 10/18 within parameters...    Morbid Obesity> weight is 409# w/ assoc incr BP, lipids, etc; everything depends on weight reduction; we reviewed diet, exercise, wt reduction strategies; offered dietary consult etc & we discussed the option/ need for Bariatric surg.    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    NAFLD> he had Hep steatosis diagnosed in the 1990s by DrPatterson; fortunately his LFTs have been ok esp since he hasn't been able to lose any weight; labs 3/18 showed LFTs remain normal...    GU> prev on Cialis20 as needed for ED but he has symptoms of Low-T w/ decr libido, weakness, etc; Labs 3/18 showed Testos level = 174 (300-890), recheck level= 214 & on Androgel 1% 4pumps/d...    DJD> on Mobic15 prn; he has had bilat knee arthroscopies but when his knees started hurting he thought it was the Lip20 & stopped it, he's been told it's bone-on-bone..    Anxiety> see below- no requiring anxiolytic meds... EXAM shows Afeb, VSS w/ BP=138/80, P=90/reg, Wt=407#, 5'10"Tall, BMI=59; HEENT- neg, mallampati3; Chest- clear w/o w/r/r;  Heart- RR w/o m/r/g; Abd- obese w/ panniculus, soft nontender; Ext- VI 1+edema w/o c/c; Neuro- ok.   LABS 08/11/17>  FLP- at goals on Lip40;  Chems- ok w/ LFTs wnl...  IMP/PLAN>>  Ryan [name]'s FLP is much improved on the Atorva40; wt unfortunately w/o change & we reviewed diet+exercise; ok flu shot;  Continue meds & ROV in 62mo...  ~  December 17, 2017:  6mo ROV & add-on appt requested for HBP>  Ryan [name] went to Ortho for knee pain w/ XRays and cortisone shots; BP was noted to be elev in their office measuring 180/130 he says, then 170/120 on recheck;  He has hx HBP,  morbid obesity w/ Wt~410# (BMI~59) and unable to lose wt despite his good efforts w/ diet 7 exercise; exercise lim by DJD knees & he tells me he started swimming 4d/wk recently... Pt also reports that he stopped taking his Atorva40 due to dizziness, and stopped taking his BP med= MetoprololER 100mg  on his own; he has Lasix20 which he uses prn for edema due to his VI;  He is also on Testosterone gel (4 pumps rubbed into skin daily)... He denies CP, palpit, ch in SOB, 1+edema noted...    We reviewed the above PROB LIST...  LABS 07/2017 reviewed...  EXAM shows Afeb, VSS w/ BP=152/92, P=79/reg, Wt=410# (BMI=59); HEENT- neg, mallampati3; Chest- clear w/o w/r/r;  Heart- RR w/o m/r/g; Abd- obese w/ panniculus, soft nontender; Ext- VI 1+edema w/o c/c; Neuro- no focal deficits... IMP/PLAN>>  He has known HBP, mod LVH on 2DEcho & we reviewed low sodium weight reducing diet & incr exercise to facilitate wt reduction; restart METOPROLOL-ER 100mg /d + LASIX20 Qam and monitor BP at home if able, call for any symptoms/ problems and we will recheck in 6wks w/ FASTING blood work...   ~  January 26, 2018:  6wk ROV & add-on appt requested for persistent elev BP>  Ryan [name] re-started his Metop-ER100 & Lasix20 last OV & has been taking them regularly along w/ low sodium diet & exercise- swimming 4-5d per week;  He has lost 10# in the interim, likely fluid as most of his edema is resolved (he has VI & trace fluid now);  Despite this improvement his home BP checks have been running high he says- he notes some AM headaches (across his temples & Rx w/ Tylenol w/ improvement), no CP/ palpit, SOB is improved from prev, sl edema persists... He tells me that he rests well, denies daytime sleepiness, notes good energy & not napping at all now (0/7), no issues w/ driving etc & says he is not snoring per his girlfriend...     We reviewed the above PROB LIST...  LABS 07/2017 reviewed...  EXAM shows Afeb, VSS w/ BP=168/86, P=93/reg, Wt=400#  (BMI=58); HEENT- neg, mallampati3; Chest- clear w/o w/r/r;  Heart- RR w/o m/r/g; Abd- obese w/ panniculus, soft nontender; Ext- VI tr+edema w/o c/c; Neuro- no focal deficits... IMP/PLAN>>  We decided to add LOSARTAN100 to his MetoprololER100 & Lasix20; aske to keep up the good work w/ low carb, low fat weight reducing diet & low sodium mas well... We plan ROV recheck in 71mo & bring home BP cuff for comparison...   ~  February 09, 2018:  2wk ROV & general medical follow up visit>  When we last saw Ryan [name] on 01/26/18 his BP was 168/86 on MetopER100 & Lasix20 and we decided to add Losartan100 + reminder about no salt, wt reducing diet (he weighs 400# w/ BMI=57;  Today he endorses good compliance w/ his 3 meds + low sodium but his weight is up 6# to 406# today;  He says he is feeling well, notes some "tension" in the back of his neck, and "slight pressure HAs on & off", he denies SOB, chr stable DOE w/ walking but is improving he says w/ exercise program at gym w/ swimming regularly; he denies CP, palpit, dizzy, weakness, numbness, etc; he has venous insuffic w/ some chr edema...    We reviewed his PROB LIST (see 08/11/17 OV note)... EXAM shows Afeb, BP=166/98, P=82/reg, Wt=406# (BMI=58); HEENT- neg, mallampati3; Chest- clear w/o w/r/r;  Heart- RR w/o m/r/g; Abd- obese w/ panniculus, soft nontender; Ext- VI tr+edema w/o c/c; Neuro- intact, no focal deficits...  LABS 02/09/18>  FLP- Chol at goals on Atorva40, TG sl elev & needs better low carb/ low fat diet & wt reduction;  Chems- wnl w/ BS=100, Cr=0.92, LFTs wnl;  CBC- wnl;  TSH=4.11;  PSA=0.18... IMP/PLAN>>  Ryan [name] has persistent HBP on 3 meds and mult serious risk factors at 62 y/o=> we decided  to incr his Lasix to 40mg /d, continue his [place] and [place] for their analysis & recommendations after cardiac work-up (recall he has mod LVH on 2DEcho & severe OSA but refused CPAP etc);  We have prev discussed my rec for Bariatric Surg due to his  morbid obesity but he has declined to consider this approach, and recommendation for CPAP titration test vs trial Auto-CPAP for his OSA but he declines this eval/ Rx as well...           Problem List:   Severe OSA> on Home Sleep Study 02/04/17> c/w severe OSA, AHI=57.6/hr of sleep, plus O2desat to 66% & ave=89%, he needs to proceed to CPAP titration for Dx of severe OSA=> pt cancelled twice;  NOTE> still >400 lbs, he wakes ~4am tired, no prob driving, naps 7/7 in afternoon, to bed by 8pm for 8H sleep, wakes x2 to urinate & feels that he sleeps well, denies snoring & sleeps on his side (wife passed away yrs ago)... We have offered AUTO CPAP trial and Sleep Med consult but he declines intervention...   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 ~  3/18>  Pt indicates that he hasn't smoked in ~19yr...  HYPERTENSION/ Mod LVH on 2DEcho - prev on Metoprolol & Accupril but he stopped these on his own when he got serious w/ diet & exercise...  ~  8/10:  weight is down 55# to 299# in 2010 and BP= 130/90 which was better... he is reluctant to restart meds, so we decided on home BP monitoring w/ goal <140/85... ~  10/11:  weight back up to 336# & BP= 142/92, but he doesn't want to restart meds stating that he knows he can improve BP by getting hias weight down w/ diet + exercise... ~  4/13:  Weight up further to 355# & BP= 152/84; denies CP, palpit, SOB, edema; desperately needs to lose the weight or start back on BP meds for cardioprotection. ~  10/13:  BP= 138/82 and he denies symptoms; weight remains elev at 358# & we reviewed the importance of weight reduction... ~  6/14:  on ASA325 & low sodium diet, no other BP meds; he knows the importance of wt reduction; BP= 140/98 & we will add the Lasix20, low sodium, wt reduction=> told he will  need additional meds if he doesn't get wt down! ~  3/15:  on ASA325 & low sodium diet, prn Lasix20, no other BP meds; he knows the importance of wt reduction; BP= 130/84 & he denies HA, CP, palpit, SOB, edema... ~  3/18>  Pt weighs 410#, BP=138/74 but 2DEcho shows mod LVH, dil AscAo at 50mm and LAE at 89mm; Rec to start METOPROLO-ER100 daily...  VENOUS INSUFFICIENCY (ICD-459.81) - he has chr ven inuffic & edema... he knows to elim sodium, elevate legs, wear support hose... prev on Lasix20  but off all meds during the last yr or so... ~  6/14:  he knows to avoid sodium, elev legs, wear support hose; he requests Lasix20 prn edema- ok...   HYPERCHOLESTEROLEMIA (ICD-272.0) - prev on LIPITOR 20mg /d + diet efforts... ~  [place] 7/99 on diet showed TChol 256, TG 342, HDL 47, LDL 141... rec> start [place]. ~  [place] 3/04 on Lip10 showed TChol 156, TG 149, HDL 42, LDL 85 ~  FLP  8/07 on Lip10 showed TChol 158, TG 133, HDL 43, LDL 89 ~  FLP 8/10 on diet showed TChol 213, TG 164, HDL 51, LDL 139... he wants to continue diet Rx. ~  FLP 10/11 on diet showed TChol 206, TG 111, HDL 46, LDL 139... rec> start LIPITOR 20mg /d. ~  FLP 4/13 on Lip20 showed TChol 164, TG 127, HDL 51, LDL 87 ~  FLP 6/14 off Lip20 now showed TChol 240, TG 226, HDL 51, LDL162  ~  FLP 3/15 on diet alone showed TChol 242, TG 161, HDL 50, LDL160  ~  FLP 3/18 on diet alone shows TChol 263, TG 215, HDL 51, LDL 181 => discussed trial Atorva40 w/ f/u labs in several months...  OBESITY (ICD-278.00) - best weight per old chart = 277# in 01/30/1989, and worst weight = 355# in January 30, 2006... we have had numerous conversations about diet + exercise strategies for weight reduction... 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 ~  he had FlexSig 1991 by DrPatterson w/ int hems and Rx w/ Anusol... 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 f/u planned 69yrs. ~  He is sched to have his f/u colonoscopy 12/13 per DrPatterson=> he has not had this done yet. ~  He cancelled January 30, 2014 appt w/ DrGessner... ~  3/18>  He needs OV w/ GI to review prev hx & sched his f/u colonoscopy...  Hx of FATTY LIVER DISEASE (ICD-571.8) - hx elevated LFT's in the 90's believed related to hepatic steatosis w/ eval by DrPatterson in 01-30-90... he drinks beer on weekends & advised to quit... LFT's have been normal in recent yrs. ~  4/13:  LFTs remain WNL despite weight gain to 355# ~  6/14:  LFTs remain WNL despite wt gain to 365# ~  3/15:  LFTs remain wnl despite his weight of 374# ~  3/18>  Fortunately his LFTs remain wnl despite wt gain to 410#  DEGENERATIVE JOINT DISEASE (ICD-715.90) - he uses OTC analgesics as needed... ~  s/p right knee arthroscopy 2/05 by DrGioffre for meniscus tear... ~  s/p left knee arthroscopy 8/13 by drNorris for torn meniscus... ~  He says he was old that his knees are bone-on-bone...  ANXIETY (ICD-300.00) - pt's wife died 01/30/02 from breast cancer- 2 daughters & one has grad from [employer]...  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   Past Surgical History:  Procedure Laterality Date  . COLONOSCOPY W/ POLYPECTOMY  06/27/2009  . FLEXIBLE SIGMOIDOSCOPY    . left knee arthroscopy  05/24/2012   torn meniscus   . right knee arthroscopy  11/2003   Dr. Gladstone Lighter    Outpatient Encounter Medications as of 02/09/2018  Medication Sig  . aspirin 325 MG tablet Take 325 mg by mouth daily.  Marland Kitchen atorvastatin (LIPITOR) 40 MG tablet Take 1 tablet (40 mg total)  by mouth daily.  Marland Kitchen CIALIS 20 MG tablet  TAKE 1 TABLET DAILY AS NEEDED FOR ERECTILE DYSFUNCTION  . furosemide (LASIX) 20 MG tablet TAKE 1 TABLET DAILY EVERY MORNING  . losartan (COZAAR) 100 MG tablet Take 1 tablet (100 mg total) by mouth daily.  . metoprolol succinate (TOPROL-XL) 100 MG 24 hr tablet Take 1 tablet (100 mg total) by mouth daily. Take with or immediately following a meal.  . Naproxen Sodium (ALEVE) 220 MG CAPS Take by mouth.  Marland Kitchen omeprazole (PRILOSEC OTC) 20 MG tablet Take 20 mg by mouth daily. 30 minutes before a meal AS NEEDED  . Testosterone 12.5 MG/ACT (1%) GEL 4 pumps rubbing into skin daily in the morning   No facility-administered encounter medications on file as of 02/09/2018.     No Known Allergies     Immunization History  Administered Date(s) Administered  . Influenza Split 08/17/2012  . Influenza Whole 08/19/2010  . Influenza,inj,Quad PF,6+ Mos 08/11/2017     Current Medications, Allergies, Past Medical History, Past Surgical History, Family History, and Social History were reviewed in [employer] record.   Review of Systems       The patient denies fever, chills, sweats, anorexia, fatigue, weakness, malaise, weight loss, sleep disorder, blurring, diplopia, eye irritation, eye discharge, vision loss, eye pain, photophobia, earache, ear discharge, tinnitus, decreased hearing, nasal congestion, nosebleeds, sore throat, hoarseness, chest pain, palpitations, syncope, dyspnea on exertion, orthopnea, PND, peripheral edema, cough, dyspnea at rest, excessive sputum, hemoptysis, wheezing, pleurisy, nausea, vomiting, diarrhea, constipation, change in bowel habits, abdominal pain, melena, hematochezia, jaundice, gas/bloating, indigestion/heartburn, dysphagia, odynophagia, dysuria, hematuria, urinary frequency, urinary hesitancy, nocturia, incontinence, back pain, joint pain, joint swelling, muscle cramps, muscle weakness, stiffness, arthritis, sciatica,  restless legs, leg pain at night, leg pain with exertion, itching, dryness, suspicious lesions, paralysis, paresthesias, seizures, tremors, vertigo, transient blindness, frequent falls, frequent headaches, difficulty walking, depression, anxiety, memory loss, confusion, cold intolerance, heat intolerance, polydipsia, polyphagia, polyuria, unusual weight change, abnormal bruising, bleeding, enlarged lymph nodes, urticaria, allergic rash, hay fever, and recurrent infections.     Objective:   Physical Exam     WD, Obese, 62 y/o WM in NAD... GENERAL:  Alert & oriented; pleasant & cooperative... HEENT:  [place]/AT, EOM-wnl, PERRLA, Fundi-benign, EACs-clear, TMs-wnl, NOSE-clear, THROAT-clear & wnl. NECK:  Supple w/ full ROM; no JVD; normal carotid impulses w/o bruits; no thyromegaly or nodules palpated; no lymphadenopathy. CHEST:  Clear to P & A; without wheezes/ rales/ or rhonchi. HEART:  Regular Rhythm; without murmurs/ rubs/ or gallops. ABDOMEN:  Obese, soft & nontender; normal bowel sounds; no organomegaly or masses detected. (RECTAL:  +rectal stricture, tender, can't reach prostate, stool heme neg) EXT: without deformities or arthritic changes; no varicose veins/ venous insuffic/ or edema. NEURO:  CN's intact; motor testing normal; sensory testing normal; gait normal & balance OK. DERM:  No lesions noted; no rash etc...  RADIOLOGY DATA:  Reviewed in the EPIC EMR & discussed w/ the patient...  LABORATORY DATA:  Reviewed in the EPIC EMR & discussed w/ the patient...   Assessment:      12/31/16>   His DYSPNEA is clearly multifactorial w/ components from morbid obesity, deconditioning, prob pulm restriction, ex-smoker, r/o OSA, r/o cardiac problems (LVH, Gr1DD, dil asc ao & LA);  If he is to improve his condition going forward he will definitely have to lose weight & in my opinion Bariatric surg (poss gastric sleeve) is his only hope, although we reviewed DIET/ EXERCISE/ Wt reduction  strategies... PLAN>>  Extensive w/u in progress-- we will  start MetoprololER100 for LVH & AscAo at 40mm;  Start Atorva40 for Lipids;  Refill his Meloxicam15 prn arthritis pain;  But he understands that substantial improvement requires him to lose wt and inasmuch as he has been totally unable to lose wt x many yrs=> needs to consider Bariatric option as a life saving procedure!  Asked to call [facility] to start the process and learn more about what they can do for him. 02/04/17>   I discussed w/ Nicki Reaper the imperative to lose weight over the long term & the need for CPAP to treat his OSA & nocturnal hypoxemia=> proceed w/ in-lab CPAP titration test & mask fit session;  In the interim we have initiated rx w/ ASA, B-blocker, statin, & he should qualify for topical testos replacement therapy...  08/11/17>   Yacoub's FLP is much improved on the Atorva40; wt unfortunately w/o change & we reviewed diet+exercise; ok flu shot;  Continue meds & ROV in 62mo... 12/17/17>   We reviewed low sodium weight reducing diet & incr exercise to facilitate wt reduction; restart METOPROLOL-ER 100mg /d + LASIX20 Qam and monitor BP at home if able, call for any symptoms/ problems and we will recheck in 6wks w/ FASTING blood work 01/26/18>   We decided to add LOSARTAN100 to his MetoprololER100 & Lasix20; aske to keep up the good work w/ low carb, low fat weight reducing diet & low sodium mas well... We plan ROV recheck in 41mo & bring home BP cuff for comparison 02/09/18>   Tarvares has persistent HBP on 3 meds and mult serious risk factors at 62 y/o=> we decided to incr his Lasix to 40mg /d, continue his [place] and REFER TO CARDS for their analysis & recommendations after cardiac work-up (recall he has mod LVH on 2DEcho);  We have prev discussed my rec for Bariatric Surg due to his morbid obesity but he has declined to consider this approach...    MORBID OBESITY>> wt was up to 410 lbs w/ assoc w/ DOE => we reviewed the imperative for  weight reduction & I have again rec that he consider Bariatric surg as a life saving procedure for him (he declines)... 01/26/18>  He's lost 10# down to 400# today- keep up the good work...  Severe OSA> on Home Sleep Study 02/04/17> c/w severe OSA, AHI=57.6/hr of sleep, plus O2desat to 66% & ave=89%, he needs to proceed to CPAP titration for Dx of severe OSA=> pt cancelled twice;  NOTE> still >400 lbs, he wakes ~4am tired, no prob driving, naps 7/7 in afternoon, to bed by 8pm for 8H sleep, wakes x2 to urinate & feels that he sleeps well, denies snoring & sleeps on his side (wife passed away yrs ago)... We have offered AUTO CPAP trial and Sleep Med consult but he declines intervention...  01/26/18>  He claims that he is fine now- resting well, wakes refreshed, no daytime sleepiness & not requiring naps etc; furthermore he says girlfriend says he is not snoring & resting satis; I indicated to him that it is unlikely that his severe OSA would disappear on it's own & the only way to tell if he is still at risk is to recheck a sleep study but he refuses...  HBP>  Borderline BP on "diet" alone; reviewed need for no salt & weight reduction=> 2DEcho 3/18 shows mod LVH, dilated AscAo at 46mm & Gr1DD => added Metoprolol100/d...  Ven Insuffic>  He knows to elim sodium, elev legs, wear support hose, etc...  CHOL>  Prev  on Lip20 but he stopped on his own due to knee pain; FLP was way off & he wanted to try Cres10 but never filled the Rx;  12/2016 rec Atorva40 to start now..  GI> Divertics, Polyps, Hems, rectal stricture>  He uses OTC Prilosec prn; he has rectal stricture & needs to maintain soft stool due to narrow caliber; neg for blood but due for colonoscopy per DrPatterson soon...  Fatty Liver Dis>  LFTs remain normal despite weight gain...  DJD>  Aware- followed by Ortho on Mobic & we discussed "wear & tear" joints, his weight, etc...     Plan:     Patient's Medications  New Prescriptions   FUROSEMIDE  (LASIX) 40 MG TABLET    Take 1 tablet (40 mg total) by mouth daily.  Previous Medications   ASPIRIN 325 MG TABLET    Take 325 mg by mouth daily.   ATORVASTATIN (LIPITOR) 40 MG TABLET    Take 1 tablet (40 mg total) by mouth daily.   CIALIS 20 MG TABLET    TAKE 1 TABLET DAILY AS NEEDED FOR ERECTILE DYSFUNCTION   LOSARTAN (COZAAR) 100 MG TABLET    Take 1 tablet (100 mg total) by mouth daily.   METOPROLOL SUCCINATE (TOPROL-XL) 100 MG 24 HR TABLET    Take 1 tablet (100 mg total) by mouth daily. Take with or immediately following a meal.   NAPROXEN SODIUM (ALEVE) 220 MG CAPS    Take by mouth.   OMEPRAZOLE (PRILOSEC OTC) 20 MG TABLET    Take 20 mg by mouth daily. 30 minutes before a meal AS NEEDED   TESTOSTERONE 12.5 MG/ACT (1%) GEL    4 pumps rubbing into skin daily in the morning  Modified Medications   No medications on file  Discontinued Medications   No medications on file

## 2018-02-09 NOTE — Patient Instructions (Signed)
Today we updated your med list in our EPIC system...     We decided to increase your LASIX (Furosemide) fro 20mg  to 40mg  each AM...  Continue the METOPROLOL-ER 100 & the LOSARTAN 100 daily...  We discussed obtaining a CARDIOLOGY Consult regarding your difficult HBP especially in light of your 2DEchocardiogram showing moderate LVH...  Today we checked your follow up FASTING blood work...    We will contact you w/ the results when available...   Continue your diet, low sodium, & exercise program to continue working on weight reduction...  Let's plan a follow up visit in 26mo to note your progress & answer any questions.Marland KitchenMarland Kitchen

## 2018-02-23 ENCOUNTER — Telehealth: Payer: Self-pay | Admitting: Pulmonary Disease

## 2018-02-23 MED ORDER — ATORVASTATIN CALCIUM 40 MG PO TABS: 40.0000 mg | ORAL_TABLET | Freq: Every day | ORAL | 4 refills | Status: AC

## 2018-02-23 NOTE — Telephone Encounter (Signed)
Spoke with pt. He is requesting that we send a prescription for Atorvastatin to [facility]. Rx has been sent in. Nothing further was needed.

## 2018-03-05 ENCOUNTER — Other Ambulatory Visit: Payer: Self-pay

## 2018-03-05 ENCOUNTER — Observation Stay (HOSPITAL_COMMUNITY)
Admission: EM | Admit: 2018-03-05 | Discharge: 2018-03-06 | Disposition: A | Discharge status: Home / Self Care | Payer: Managed Care, Other (non HMO) | Attending: Internal Medicine | Admitting: Internal Medicine

## 2018-03-05 ENCOUNTER — Encounter (HOSPITAL_COMMUNITY): Payer: Self-pay | Admitting: Internal Medicine

## 2018-03-05 ENCOUNTER — Observation Stay (HOSPITAL_BASED_OUTPATIENT_CLINIC_OR_DEPARTMENT_OTHER)
Admit: 2018-03-05 | Discharge: 2018-03-05 | Disposition: A | Discharge status: Home / Self Care | Payer: Managed Care, Other (non HMO) | Attending: Internal Medicine | Admitting: Internal Medicine

## 2018-03-05 ENCOUNTER — Observation Stay (HOSPITAL_COMMUNITY): Payer: Managed Care, Other (non HMO)

## 2018-03-05 ENCOUNTER — Emergency Department (HOSPITAL_COMMUNITY): Payer: Managed Care, Other (non HMO)

## 2018-03-05 DIAGNOSIS — R4781 Slurred speech: Secondary | ICD-10-CM

## 2018-03-05 DIAGNOSIS — Z79899 Other long term (current) drug therapy: Secondary | ICD-10-CM | POA: Insufficient documentation

## 2018-03-05 DIAGNOSIS — G459 Transient cerebral ischemic attack, unspecified: Secondary | ICD-10-CM

## 2018-03-05 DIAGNOSIS — H538 Other visual disturbances: Secondary | ICD-10-CM | POA: Diagnosis present

## 2018-03-05 DIAGNOSIS — R4701 Aphasia: Secondary | ICD-10-CM | POA: Insufficient documentation

## 2018-03-05 DIAGNOSIS — Z87891 Personal history of nicotine dependence: Secondary | ICD-10-CM | POA: Insufficient documentation

## 2018-03-05 DIAGNOSIS — Z7982 Long term (current) use of aspirin: Secondary | ICD-10-CM | POA: Insufficient documentation

## 2018-03-05 DIAGNOSIS — I110 Hypertensive heart disease with heart failure: Secondary | ICD-10-CM | POA: Insufficient documentation

## 2018-03-05 DIAGNOSIS — G4733 Obstructive sleep apnea (adult) (pediatric): Secondary | ICD-10-CM | POA: Diagnosis present

## 2018-03-05 DIAGNOSIS — I5189 Other ill-defined heart diseases: Secondary | ICD-10-CM

## 2018-03-05 DIAGNOSIS — I10 Essential (primary) hypertension: Secondary | ICD-10-CM | POA: Diagnosis present

## 2018-03-05 DIAGNOSIS — I5030 Unspecified diastolic (congestive) heart failure: Secondary | ICD-10-CM | POA: Insufficient documentation

## 2018-03-05 HISTORY — DX: Other ill-defined heart diseases: I51.89

## 2018-03-05 HISTORY — DX: Transient cerebral ischemic attack, unspecified: G45.9

## 2018-03-05 HISTORY — DX: Slurred speech: R47.81

## 2018-03-05 HISTORY — DX: Unspecified osteoarthritis, unspecified site: M19.90

## 2018-03-05 LAB — CBC
HCT: 43.8 % (ref 39.0–52.0)
Hemoglobin: 14.8 g/dL (ref 13.0–17.0)
MCH: 29.5 pg (ref 26.0–34.0)
MCHC: 33.8 g/dL (ref 30.0–36.0)
MCV: 87.3 fL (ref 78.0–100.0)
PLATELETS: 256 10*3/uL (ref 150–400)
RBC: 5.02 MIL/uL (ref 4.22–5.81)
RDW: 14.6 % (ref 11.5–15.5)
WBC: 12.2 10*3/uL — AB (ref 4.0–10.5)

## 2018-03-05 LAB — HEMOGLOBIN A1C
Hgb A1c MFr Bld: 5.9 % — ABNORMAL HIGH (ref 4.8–5.6)
Mean Plasma Glucose: 122.63 mg/dL

## 2018-03-05 LAB — LIPID PANEL
CHOLESTEROL: 137 mg/dL (ref 0–200)
HDL: 43 mg/dL (ref >40)
LDL CALC: 67 mg/dL (ref 0–99)
TRIGLYCERIDES: 134 mg/dL (ref <150)
Total CHOL/HDL Ratio: 3.2 RATIO
VLDL: 27 mg/dL (ref 0–40)

## 2018-03-05 LAB — COMPREHENSIVE METABOLIC PANEL
ALBUMIN: 4.1 g/dL (ref 3.5–5.0)
ALK PHOS: 79 U/L (ref 38–126)
ALT: 20 U/L (ref 17–63)
ANION GAP: 12 (ref 5–15)
AST: 19 U/L (ref 15–41)
BILIRUBIN TOTAL: 0.8 mg/dL (ref 0.3–1.2)
BUN: 14 mg/dL (ref 6–20)
CALCIUM: 9 mg/dL (ref 8.9–10.3)
CO2: 26 mmol/L (ref 22–32)
Chloride: 101 mmol/L (ref 101–111)
Creatinine, Ser: 1.1 mg/dL (ref 0.61–1.24)
Glucose, Bld: 99 mg/dL (ref 65–99)
POTASSIUM: 4 mmol/L (ref 3.5–5.1)
Sodium: 139 mmol/L (ref 135–145)
Total Protein: 6.9 g/dL (ref 6.5–8.1)

## 2018-03-05 LAB — APTT: APTT: 28 s (ref 24–36)

## 2018-03-05 LAB — URINALYSIS, ROUTINE W REFLEX MICROSCOPIC
Bilirubin Urine: NEGATIVE
Glucose, UA: NEGATIVE mg/dL
HGB URINE DIPSTICK: NEGATIVE
Ketones, ur: NEGATIVE mg/dL
Leukocytes, UA: NEGATIVE
NITRITE: NEGATIVE
PROTEIN: NEGATIVE mg/dL
Specific Gravity, Urine: 1.018 (ref 1.005–1.030)
pH: 5 (ref 5.0–8.0)

## 2018-03-05 LAB — PROTIME-INR
INR: 1.08
PROTHROMBIN TIME: 13.9 s (ref 11.4–15.2)

## 2018-03-05 LAB — I-STAT TROPONIN, ED: TROPONIN I, POC: 0.01 ng/mL (ref 0.00–0.08)

## 2018-03-05 LAB — RAPID URINE DRUG SCREEN, HOSP PERFORMED
Amphetamines: NOT DETECTED
BARBITURATES: NOT DETECTED
BENZODIAZEPINES: NOT DETECTED
Cocaine: NOT DETECTED
Opiates: NOT DETECTED
Tetrahydrocannabinol: NOT DETECTED

## 2018-03-05 LAB — ETHANOL

## 2018-03-05 MED ORDER — ACETAMINOPHEN 160 MG/5ML PO SOLN: 650.0000 mg | ORAL | Status: DC | PRN

## 2018-03-05 MED ORDER — ASPIRIN 325 MG PO TABS: 325.0000 mg | ORAL_TABLET | Freq: Every day | ORAL | Status: DC

## 2018-03-05 MED ORDER — FUROSEMIDE 20 MG PO TABS: 20.0000 mg | ORAL_TABLET | Freq: Every day | ORAL | Status: DC

## 2018-03-05 MED ORDER — METOPROLOL SUCCINATE ER 100 MG PO TB24: 100.0000 mg | ORAL_TABLET | Freq: Every day | ORAL | Status: DC

## 2018-03-05 MED ORDER — ATORVASTATIN CALCIUM 40 MG PO TABS: 40.0000 mg | ORAL_TABLET | Freq: Every day | ORAL | Status: DC

## 2018-03-05 MED ORDER — PANTOPRAZOLE SODIUM 40 MG PO TBEC
40.0000 mg | DELAYED_RELEASE_TABLET | Freq: Every day | ORAL | Status: DC
  Administered 2018-03-05: 40 mg via ORAL
  Filled 2018-03-05: qty 1

## 2018-03-05 MED ORDER — STROKE: EARLY STAGES OF RECOVERY BOOK
Freq: Once | Status: AC
  Administered 2018-03-05: 19:00:00
  Filled 2018-03-05: qty 1

## 2018-03-05 MED ORDER — SENNOSIDES-DOCUSATE SODIUM 8.6-50 MG PO TABS: 1.0000 | ORAL_TABLET | Freq: Every evening | ORAL | Status: DC | PRN

## 2018-03-05 MED ORDER — ACETAMINOPHEN 650 MG RE SUPP: 650.0000 mg | RECTAL | Status: DC | PRN

## 2018-03-05 MED ORDER — ACETAMINOPHEN 325 MG PO TABS: 650.0000 mg | ORAL_TABLET | ORAL | Status: DC | PRN

## 2018-03-05 MED ORDER — SODIUM CHLORIDE 0.9 % IV SOLN
INTRAVENOUS | Status: DC
  Administered 2018-03-05: 19:00:00 via INTRAVENOUS

## 2018-03-05 MED ORDER — OMEPRAZOLE MAGNESIUM 20 MG PO TBEC: 20.0000 mg | DELAYED_RELEASE_TABLET | Freq: Every day | ORAL | Status: DC

## 2018-03-05 MED ORDER — FUROSEMIDE 40 MG PO TABS: 40.0000 mg | ORAL_TABLET | Freq: Every day | ORAL | Status: DC

## 2018-03-05 MED ORDER — ENOXAPARIN SODIUM 40 MG/0.4ML ~~LOC~~ SOLN
40.0000 mg | SUBCUTANEOUS | Status: DC
Start: 2018-03-05 — End: 2018-03-06
  Administered 2018-03-05: 40 mg via SUBCUTANEOUS
  Filled 2018-03-05: qty 0.4

## 2018-03-05 MED ORDER — CLOPIDOGREL BISULFATE 75 MG PO TABS
75.0000 mg | ORAL_TABLET | Freq: Every day | ORAL | Status: DC
  Administered 2018-03-05 – 2018-03-06 (×2): 75 mg via ORAL
  Filled 2018-03-05 (×2): qty 1

## 2018-03-05 MED ORDER — LOSARTAN POTASSIUM 50 MG PO TABS: 100.0000 mg | ORAL_TABLET | Freq: Every day | ORAL | Status: DC
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
## 2018-03-05 NOTE — ED Notes (Signed)
Patient transported to CT 

## 2018-03-05 NOTE — ED Provider Notes (Signed)
[facility] EMERGENCY DEPARTMENT Provider Note  CSN: [id number] Arrival date & time: 03/05/18 1244  Chief Complaint(s) Blurred Vision and Aphasia  HPI Ryan [name] is a 62 y.o. male with past medical history listed below including hypertension, hyperlipidemia, diastolic heart failure on Lasix, obesity who presents to the emergency department for slurred speech that was sudden onset approximately 1 hour prior to arrival.  This lasted for less than 30 minutes.  This was noted by family over the phone.  Patient reports that he was driving to a meeting and noted visual changes including blurriness on the periphery with flashing colors.  Patient pulled over and called family who noted the patient had slurred speech, stating that he sounded "drunk."  When he finally got home, the patient's symptoms had resolved.  Patient reports that 30 minutes prior to this incident he felt a mild headache which resolved after taking Tylenol.  States that for the past 3 weeks he has been having intermittent headaches that are not associated with the visual changes.  These typically resolve with Tylenol.  He does endorse intermittent light flashing when trying to read which quickly resolved.  States that this episode lasted longer than usual.  He is also endorsing several weeks of postural dizziness that was initially intermittent in the beginning and is now more persistent.  States that this resolves when resting or sitting.  He denies any focal weakness, vision loss, current headache, chest pain, shortness of breath, nausea.  He denies any recent fevers or infections.  No nausea or vomiting.  No abdominal pain.  No urinary symptoms.  Patient denies any prior history of stroke or migraines.  HPI  Past Medical History Past Medical History:  Diagnosis Date  . Anxiety   . Cigarette smoker   . Diverticulosis of colon   . DJD (degenerative joint disease)   . Fatty liver disease, nonalcoholic   .  Hemorrhoids   . Hx of colonic polyps   . Hypercholesteremia   . Hypertension   . Obesity   . Seborrheic dermatitis   . Stenosis of rectum and anus   . Venous insufficiency    Patient Active Problem List   Diagnosis Date Noted  . Hypertension 12/17/2017  . Obstructive sleep apnea syndrome, severe 02/06/2017  . Hypogonadism male 02/04/2017  . Morbid obesity ([place]) 12/31/2016  . Chronic venous insufficiency 12/31/2016  . Edema 12/31/2016  . Dyspnea 12/31/2016  . Borderline hypertension 01/11/2014  . Physical exam, annual 04/13/2013  . COLONIC POLYPS 08/24/2010  . DIVERTICULOSIS OF COLON 08/24/2010  . SEBORRHEIC DERMATITIS 08/24/2010  . ANXIETY 05/27/2009  . CIGARETTE SMOKER 05/27/2009  . HEMORRHOIDS 05/27/2009  . STENOSIS OF RECTUM AND ANUS 05/27/2009  . FATTY LIVER DISEASE 05/27/2009  . Osteoarthritis 05/27/2009  . HYPERCHOLESTEROLEMIA 05/22/2009   Home Medication(s) Prior to Admission medications   Medication Sig Start Date End Date Taking? Authorizing Provider  aspirin 325 MG tablet Take 325 mg by mouth daily.    [provider]  atorvastatin (LIPITOR) 40 MG tablet Take 1 tablet (40 mg total) by mouth daily. 02/23/18   Noralee Space, MD  CIALIS 20 MG tablet TAKE 1 TABLET DAILY AS NEEDED FOR ERECTILE DYSFUNCTION 05/11/14   Noralee Space, MD  furosemide (LASIX) 20 MG tablet TAKE 1 TABLET DAILY EVERY MORNING 01/12/18   Noralee Space, MD  furosemide (LASIX) 40 MG tablet Take 1 tablet (40 mg total) by mouth daily. 02/09/18   Noralee Space, MD  losartan (COZAAR) 100 MG tablet Take 1 tablet (100 mg total) by mouth daily. 01/26/18   Noralee Space, MD  metoprolol succinate (TOPROL-XL) 100 MG 24 hr tablet Take 1 tablet (100 mg total) by mouth daily. Take with or immediately following a meal. 01/29/17   Noralee Space, MD  Naproxen Sodium (ALEVE) 220 MG CAPS Take by mouth.    [provider]  omeprazole (PRILOSEC OTC) 20 MG tablet Take 20 mg by mouth daily. 30 minutes  before a meal AS NEEDED    [provider]  Testosterone 12.5 MG/ACT (1%) GEL 4 pumps rubbing into skin daily in the morning 01/19/18   Noralee Space, MD                                                                                                                                    Past Surgical History Past Surgical History:  Procedure Laterality Date  . COLONOSCOPY W/ POLYPECTOMY  06/27/2009  . FLEXIBLE SIGMOIDOSCOPY    . left knee arthroscopy  05/24/2012   torn meniscus   . right knee arthroscopy  11/2003   Dr. Gladstone Lighter   Family History No family history on file.  Social History Social History   Tobacco Use  . Smoking status: Former Smoker    Packs/day: 1.00    Years: 27.00    Pack years: 27.00    Types: Cigarettes    Last attempt to quit: 10/20/2009    Years since quitting: 8.3  . Smokeless tobacco: Never Used  . Tobacco comment: 2 packs per week  Substance Use Topics  . Alcohol use: No    Comment: 6-10 beers per week--stopped drinking in the last 2 years  . Drug use: Not on file   Allergies Patient has no known allergies.  Review of Systems Review of Systems All other systems are reviewed and are negative for acute change except as noted in the HPI  Physical Exam Vital Signs  I have reviewed the triage vital signs BP (!) 150/98   Pulse 80   Resp 16   Wt (!) 184.2 kg (406 lb)   SpO2 96%   BMI 58.25 kg/m   Physical Exam  Constitutional: He is oriented to person, place, and time. He appears well-developed and well-nourished. No distress.  HENT:  Head: Normocephalic and atraumatic.  Nose: Nose normal.  Eyes: Pupils are equal, round, and reactive to light. Conjunctivae and EOM are normal. Right eye exhibits no discharge. Left eye exhibits no discharge. No scleral icterus.  Neck: Normal range of motion. Neck supple.  Cardiovascular: Normal rate and regular rhythm. Exam reveals no gallop and no friction rub.  No murmur heard. Pulmonary/Chest: Effort normal  and breath sounds normal. No stridor. No respiratory distress. He has no rales.  Abdominal: Soft. He exhibits no distension. There is no tenderness.  Musculoskeletal: He exhibits no edema or tenderness.  1+ bilateral lower extremity  edema  Neurological: He is alert and oriented to person, place, and time.  Mental Status:  Alert and oriented to person, place, and time.  Attention and concentration normal.  Speech clear.  Recent memory is intact  Cranial Nerves:  II Visual Fields: Intact to confrontation. Visual fields intact. III, IV, VI: Pupils equal and reactive to light and near. Full eye movement without nystagmus  V Facial Sensation: Normal. No weakness of masticatory muscles  VII: No facial weakness or asymmetry  VIII Auditory Acuity: Grossly normal  IX/X: The uvula is midline; the palate elevates symmetrically  XI: Normal sternocleidomastoid and trapezius strength  XII: The tongue is midline. No atrophy or fasciculations.   Motor System: Muscle Strength: 5/5 and symmetric in the upper and lower extremities. No pronation or drift.  Muscle Tone: Tone and muscle bulk are normal in the upper and lower extremities.   Reflexes: DTRs: 1+ and symmetrical in all four extremities. No Clonus Coordination: Intact finger-to-nose. No tremor.  Sensation: Intact to light touch, and pinprick.   Gait: deferred  HINTS Plus: Nystagmus: Unilateral, lateral nystagmus with fast beat to the left.  Head impulse: Abnormal with head impluse to the right Skew: normal Hearing: intact    Skin: Skin is warm and dry. No rash noted. He is not diaphoretic. No erythema.  Psychiatric: He has a normal mood and affect.  Vitals reviewed.   ED Results and Treatments Labs (all labs ordered are listed, but only abnormal results are displayed) Labs Reviewed  CBC - Abnormal; Notable for the following components:      Result Value   WBC 12.2 (*)    All other components within normal limits  PROTIME-INR    APTT  RAPID URINE DRUG SCREEN, HOSP PERFORMED  URINALYSIS, ROUTINE W REFLEX MICROSCOPIC  COMPREHENSIVE METABOLIC PANEL  ETHANOL  I-STAT CHEM 8, ED  I-STAT TROPONIN, ED                                                                                                                         EKG  EKG Interpretation  Date/Time:  Friday Mar 05 2018 12:48:44 EDT Ventricular Rate:  82 PR Interval:    QRS Duration: 97 QT Interval:  386 QTC Calculation: 451 R Axis:   17 Text Interpretation:  Sinus rhythm Atrial premature complex Prolonged PR interval Abnormal R-wave progression, late transition NO STEMI No old tracing to compare Confirmed by Addison Lank [phone]) on 03/05/2018 1:54:11 PM      Radiology Ct Head Wo Contrast  Result Date: 03/05/2018 CLINICAL DATA:  TIA, blurred vision and dizziness. History of hypertension and diabetes EXAM: CT HEAD WITHOUT CONTRAST TECHNIQUE: Contiguous axial images were obtained from the base of the skull through the vertex without intravenous contrast. COMPARISON:  None. FINDINGS: Brain: No evidence of acute infarction, hemorrhage, hydrocephalus, extra-axial collection or mass lesion/mass effect. Vascular: Negative for hyperdense vessel Skull: Negative Sinuses/Orbits: Negative Other: None IMPRESSION: Negative CT head Electronically Signed   By: Juanda Crumble  Carlis Abbott M.D.   On: 03/05/2018 15:00   Pertinent labs & imaging results that were available during my care of the patient were reviewed by me and considered in my medical decision making (see chart for details).  Medications Ordered in ED Medications - No data to display                                                                                                                                  Procedures Procedures  (including critical care time)  Medical Decision Making / ED Course I have reviewed the nursing notes for this encounter and the patient's prior records (if available in EHR or on provided  paperwork).    Slurred speech now resolved.  No prior history of CVA.  Concern for possible TIA given his risk factors.  CT head was unremarkable.  Screening labs without significant electrolyte derangements or renal insufficiency.  CBC with mild leukocytosis without anemia.  UDS negative.  EKG without acute ischemic changes or evidence of pericarditis, no dysrhythmias or blocks.  Regarding the patient's lightheadedness, his hints exam was reassuring for likely peripheral etiology.  However given the slurred speech, patient will require admission for TIA work-up and will have an MRI to rule out central process.  We will discussed the case with medicine for admission and TIA work-up.    Final Clinical Impression(s) / ED Diagnoses Final diagnoses:  Slurred speech  Blurred vision      This chart was dictated using voice recognition software.  Despite best efforts to proofread,  errors can occur which can change the documentation meaning.   Fatima Blank, MD 03/05/18 [phone]

## 2018-03-05 NOTE — Consult Note (Addendum)
Requesting Physician: Dr. Evangeline Gula    Chief Complaint: TIA  History obtained from:  Patient     HPI:                                                                                                                                         Ryan [name] is an 62 y.o. male with past medical history of smoking, tension, hyperlipidemia and obesity.  Patient does take an aspirin on daily basis.  He recently lost 40 pounds by swimming every day.  He knows he is sleep apnea but has not done anything about it as of yet.  Patient does take testosterone and feels like his him more energy.  Over the last few days he is felt some abnormal sensations and feelings but has not done anything about it.  He does state that every now and then his left shoulder feels tingly but it goes away.  However this morning when he woke up he noted that he did not feel right however he got in his car at approximately 1030 and he was driving down the road when he noted that he had these wavy lines throughout the outside of his vision.  He had no weakness at that time.  He called his daughter who noted that he has some slowness in his speech.  At that time he drove to the daughter's house who then brought him to the emergency department.  The episode lasted approximately 20 to 25 minutes and then subsided.  He does state he had a mild headache.  He states that he does have headaches but usually they are bandlike and not migraine associated.  He denies any head trauma, concussion, febrile seizures, or abnormal birth.  He has no history of stroke or TIA in the past.  He does know he has an elevated LDL.  But as stated he has begun to become very active trying to lose weight.  Currently he is laying in the bed comfortable and able to follow commands.  Date last known well: Date: 03/05/2018 Time last known well: Time: 11:00 tPA Given: No: 10 times resolved NIH stroke scale 0 Modified Rankin: Rankin Score=0   Past Medical History:   Diagnosis Date  . Anxiety   . Cigarette smoker   . Diastolic dysfunction   . Diverticulosis of colon   . DJD (degenerative joint disease)   . Fatty liver disease, nonalcoholic   . Hemorrhoids   . Hx of colonic polyps   . Hypercholesteremia   . Hypertension   . Obesity   . Seborrheic dermatitis   . Slurred speech   . Stenosis of rectum and anus   . Venous insufficiency     Past Surgical History:  Procedure Laterality Date  . COLONOSCOPY W/ POLYPECTOMY  06/27/2009  . FLEXIBLE SIGMOIDOSCOPY    . left knee arthroscopy  05/24/2012   torn meniscus   .  right knee arthroscopy  11/2003   Dr. Gladstone Lighter    Family History  Problem Relation Age of Onset  . Hypertension Father   . Hypertension Mother          Social History:  reports that he quit smoking about 8 years ago. His smoking use included cigarettes. He has a 27.00 pack-year smoking history. He has never used smokeless tobacco. He reports that he does not drink alcohol. His drug history is not on file.  Allergies: No Known Allergies  Medications:                                                                                                                           Current Facility-Administered Medications  Medication Dose Route Frequency Provider Last Rate Last Dose  .  stroke: mapping our early stages of recovery book   Does not apply Once Black, Karen M, NP      . 0.9 %  sodium chloride infusion   Intravenous Continuous Black, Lezlie Octave, NP      . acetaminophen (TYLENOL) tablet 650 mg  650 mg Oral Q4H PRN Radene Gunning, NP       Or  . acetaminophen (TYLENOL) solution 650 mg  650 mg Per Tube Q4H PRN Radene Gunning, NP       Or  . acetaminophen (TYLENOL) suppository 650 mg  650 mg Rectal Q4H PRN Radene Gunning, NP      . aspirin tablet 325 mg  325 mg Oral Daily Black, Karen M, NP      . atorvastatin (LIPITOR) tablet 40 mg  40 mg Oral Daily Black, Karen M, NP      . enoxaparin (LOVENOX) injection 40 mg  40 mg Subcutaneous Q24H  Radene Gunning, NP      . Derrill Memo ON 03/06/2018] furosemide (LASIX) tablet 20 mg  20 mg Oral Daily Radene Gunning, NP      . Derrill Memo ON 03/06/2018] furosemide (LASIX) tablet 40 mg  40 mg Oral Daily Black, Karen M, NP      . losartan (COZAAR) tablet 100 mg  100 mg Oral Daily Radene Gunning, NP      . Derrill Memo ON 03/06/2018] metoprolol succinate (TOPROL-XL) 24 hr tablet 100 mg  100 mg Oral Daily Black, Karen M, NP      . omeprazole (PRILOSEC OTC) EC tablet 20 mg  20 mg Oral Daily Black, Karen M, NP      . senna-docusate (Senokot-S) tablet 1 tablet  1 tablet Oral QHS PRN Radene Gunning, NP       Current Outpatient Medications  Medication Sig Dispense Refill  . acetaminophen (TYLENOL) 500 MG tablet Take 1,000 mg by mouth every 6 (six) hours as needed for mild pain.    Marland Kitchen aspirin 325 MG tablet Take 325 mg by mouth daily.    Marland Kitchen atorvastatin (LIPITOR) 40 MG tablet Take 1 tablet (40 mg total) by mouth daily. 90 tablet  4  . CIALIS 20 MG tablet TAKE 1 TABLET DAILY AS NEEDED FOR ERECTILE DYSFUNCTION 90 tablet 1  . furosemide (LASIX) 40 MG tablet Take 1 tablet (40 mg total) by mouth daily. 30 tablet 2  . losartan (COZAAR) 100 MG tablet Take 1 tablet (100 mg total) by mouth daily. 90 tablet 0  . metoprolol succinate (TOPROL-XL) 100 MG 24 hr tablet Take 1 tablet (100 mg total) by mouth daily. Take with or immediately following a meal. 90 tablet 4  . Naproxen Sodium (ALEVE) 220 MG CAPS Take by mouth.    Marland Kitchen omeprazole (PRILOSEC OTC) 20 MG tablet Take 20 mg by mouth daily. 30 minutes before a meal AS NEEDED    . Testosterone 12.5 MG/ACT (1%) GEL 4 pumps rubbing into skin daily in the morning 450 g 3  . furosemide (LASIX) 20 MG tablet TAKE 1 TABLET DAILY EVERY MORNING (Patient not taking: Reported on 03/05/2018) 90 tablet 2     ROS:                                                                                                                                       History obtained from the patient  General ROS:  Positive for -  weight loss Psychological ROS: negative for - , hallucinations, memory difficulties, mood swings or  Ophthalmic ROS: Positive for - blurry vision, ENT ROS: negative for - epistaxis, nasal discharge, oral lesions, sore throat, tinnitus or vertigo Respiratory ROS: negative for - cough,  shortness of breath or wheezing Cardiovascular ROS: negative for - chest pain, dyspnea on exertion,  Gastrointestinal ROS: negative for - abdominal pain, diarrhea,  nausea/vomiting or stool incontinence Genito-Urinary ROS: negative for - dysuria, hematuria, incontinence or urinary frequency/urgency Musculoskeletal ROS: Positive for - j joint pain Neurological ROS: as noted in HPI   General Examination:                                                                                                      Blood pressure (!) 141/80, pulse 78, resp. rate 18, weight (!) 184.2 kg (406 lb), SpO2 97 %.  HEENT-  Normocephalic, no lesions, without obvious abnormality.  Normal external eye and conjunctiva.   Cardiovascular- S1-S2 audible, pulses palpable throughout   Musculoskeletal-no joint tenderness, deformity or swelling Skin-warm and dry, no hyperpigmentation, vitiligo, or suspicious lesions  Neurological Examination Mental Status: Alert, oriented, thought content appropriate.  Speech fluent without evidence of aphasia.  Able to follow 3 step commands  without difficulty. Cranial Nerves: II:  Visual fields grossly normal,  III,IV, VI: ptosis not present, extra-ocular motions intact bilaterally, pupils equal, round, reactive to light and accommodation V,VII: smile symmetric, facial light touch sensation normal bilaterally VIII: hearing normal bilaterally IX,X: uvula rises symmetrically XI: bilateral shoulder shrug XII: midline tongue extension Motor: Right : Upper extremity   5/5    Left:     Upper extremity   5/5  Lower extremity   5/5     Lower extremity   5/5 Tone and bulk:normal tone  throughout; no atrophy noted Sensory: Pinprick and light touch intact throughout, bilaterally Deep Tendon Reflexes: 1+ and symmetric throughout Plantars: Right: downgoing   Left: downgoing Cerebellar: normal finger-to-nose, normal rapid alternating movements and normal heel-to-shin test Gait: Not tested   Lab Results: Basic Metabolic Panel: Recent Labs  Lab 03/05/18 1305  NA 139  K 4.0  CL 101  CO2 26  GLUCOSE 99  BUN 14  CREATININE 1.10  CALCIUM 9.0    CBC: Recent Labs  Lab 03/05/18 1305  WBC 12.2*  HGB 14.8  HCT 43.8  MCV 87.3  PLT 256    Lipid Panel: No results for input(s): CHOL, TRIG, HDL, CHOLHDL, VLDL, LDLCALC in the last 168 hours.  CBG: No results for input(s): GLUCAP in the last 168 hours.  Imaging: Ct Head Wo Contrast  Result Date: 03/05/2018 CLINICAL DATA:  TIA, blurred vision and dizziness. History of hypertension and diabetes EXAM: CT HEAD WITHOUT CONTRAST TECHNIQUE: Contiguous axial images were obtained from the base of the skull through the vertex without intravenous contrast. COMPARISON:  None. FINDINGS: Brain: No evidence of acute infarction, hemorrhage, hydrocephalus, extra-axial collection or mass lesion/mass effect. Vascular: Negative for hyperdense vessel Skull: Negative Sinuses/Orbits: Negative Other: None IMPRESSION: Negative CT head Electronically Signed   By: Franchot Gallo M.D.   On: 03/05/2018 15:00    Assessment and plan discussed with with attending physician and they are in agreement.    Etta Quill PA-C [facility] [phone]  03/05/2018, 4:30 PM   Attending addendum Patient seen and examined Agree with history physical above I have independently reviewed images  Assessment: 62 y.o. male with history of abnormal feelings over the last week however today had a 20 to 25-minute period in which she had scotoma like wavy lines in the surrounding of his vision.  This was associate with a mild headache but this headache  was prior to the vision abnormalities.  There is also question of slurred speech.  At this time all of his symptoms have resolved.  Given patient's history of smoking, obesity, hyperlipidemia and hypertension patient is at a high risk for TIA versus stroke.  Stroke Risk Factors - hyperlipidemia, hypertension and smoking  Recommend --HgbA1c, fasting lipid panel --MRI brain w/o and MRA head --Carotid doppler --PT consult, OT consult, Speech consult --Echocardiogram --80 mg of Atorvistatin --Prophylactic therapy-Antiplatelet med: was on home aspirin, change to Plavix 75 mg daily --Telemetry monitoring --Frequent neuro checks --NPO until passes stroke swallow screen  --Home medications include testosterone.  Testosterone should be discontinued at this time as it could increase risk for stroke.  --please page stroke NP  Or  PA  Or MD from 8am -4 pm  as this patient from this time will be  followed by the stroke.   You can look them up on [email or web address]  Password [email or web address]  -- Amie Portland, MD [facility] Pager: [phone] If 7pm to 7am, please call on call as listed on  AMION.

## 2018-03-05 NOTE — Progress Notes (Signed)
*  PRELIMINARY RESULTS* Vascular Ultrasound Carotid Duplex (Doppler) has been completed.   Findings suggest 1-39% internal carotid artery stenosis bilaterally. Vertebral arteries are patent with antegrade flow.  03/05/2018 5:51 PM Maudry Mayhew, BS, RVT, RDCS, RDMS

## 2018-03-05 NOTE — ED Triage Notes (Signed)
To ED via [facility]- pt was driving, had onset of blurry vision. None now, pt c/o has had headaches for approx 3 weeks, relieved with 3 tylenol-- also c/o some neck and shoulder tightness- none now, denies any chest pain.

## 2018-03-06 ENCOUNTER — Other Ambulatory Visit (HOSPITAL_COMMUNITY): Payer: Managed Care, Other (non HMO)

## 2018-03-06 ENCOUNTER — Observation Stay (HOSPITAL_COMMUNITY): Payer: Managed Care, Other (non HMO)

## 2018-03-06 DIAGNOSIS — G459 Transient cerebral ischemic attack, unspecified: Secondary | ICD-10-CM

## 2018-03-06 LAB — HIV ANTIBODY (ROUTINE TESTING W REFLEX): HIV SCREEN 4TH GENERATION: NONREACTIVE

## 2018-03-06 MED ORDER — CLOPIDOGREL BISULFATE 75 MG PO TABS: 75.0000 mg | ORAL_TABLET | Freq: Every day | ORAL | 0 refills | Status: DC

## 2018-03-06 NOTE — Evaluation (Signed)
Physical Therapy Evaluation Patient Details Name: Ryan [name] MRN: [id number] DOB: [birth year] Today's Date: 03/06/2018   History of Present Illness  62 y.o. male with past medical history listed below including hypertension, hyperlipidemia, diastolic heart failure on Lasix, obesity who presents to the emergency department for vision deficits and slurred speech that was sudden onset approximately 1 hour prior to arrival  Clinical Impression  Patient seen for mobility assessment. Mobilizing well. Educated patient on BEFAST stroke signs. No further acute PT needs. Will sign off.     Follow Up Recommendations No PT follow up    Equipment Recommendations  None recommended by PT    Recommendations for Other Services       Precautions / Restrictions        Mobility  Bed Mobility Overal bed mobility: Independent                Transfers Overall transfer level: Independent                  Ambulation/Gait Ambulation/Gait assistance: Independent Ambulation Distance (Feet): 400 Feet Assistive device: None Gait Pattern/deviations: Step-through pattern;Decreased stride length;Wide base of support Gait velocity: decreased Gait velocity interpretation: 1.31 - 2.62 ft/sec, indicative of limited community ambulator General Gait Details: steady with ambulation  Stairs Stairs: Yes Stairs assistance: Independent Stair Management: No rails Number of Stairs: 5 General stair comments: performed x2  Wheelchair Mobility    Modified Rankin (Stroke Patients Only) Modified Rankin (Stroke Patients Only) Pre-Morbid Rankin Score: No symptoms Modified Rankin: No symptoms     Balance Overall balance assessment: Mild deficits observed, not formally tested                           High level balance activites: Side stepping;Backward walking;Direction changes;Turns;Sudden stops;Head turns High Level Balance Comments: No over LOB ntoed              Pertinent Vitals/Pain Pain Assessment: No/denies pain    Home Living Family/patient expects to be discharged to:: Private residence Living Arrangements: Spouse/significant other Available Help at Discharge: Family Type of Home: House Home Access: Stairs to enter Entrance Stairs-Rails: None Technical brewer of Steps: 2   Home Equipment: Cane - single point      Prior Function Level of Independence: Independent         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    Extremity/Trunk Assessment   Upper Extremity Assessment Upper Extremity Assessment: Defer to OT evaluation    Lower Extremity Assessment Lower Extremity Assessment: Overall WFL for tasks assessed    Cervical / Trunk Assessment Cervical / Trunk Assessment: (increased body habitus)  Communication   Communication: No difficulties  Cognition Arousal/Alertness: Awake/alert Behavior During Therapy: WFL for tasks assessed/performed Overall Cognitive Status: Within Functional Limits for tasks assessed                                        General Comments      Exercises     Assessment/Plan    PT Assessment Patent does not need any further PT services  PT Problem List         PT Treatment Interventions      PT Goals (Current goals can be found in the Care Plan section)  Acute Rehab PT Goals PT Goal Formulation:  All assessment and education complete, DC therapy    Frequency     Barriers to discharge        Co-evaluation               AM-PAC PT "6 Clicks" Daily Activity  Outcome Measure Difficulty turning over in bed (including adjusting bedclothes, sheets and blankets)?: None Difficulty moving from lying on back to sitting on the side of the bed? : None Difficulty sitting down on and standing up from a chair with arms (e.g., wheelchair, bedside commode, etc,.)?: None Help needed moving to and from a bed to chair (including  a wheelchair)?: None Help needed walking in hospital room?: A Little Help needed climbing 3-5 steps with a railing? : A Little 6 Click Score: 22    End of Session   Activity Tolerance: Patient tolerated treatment well Patient left: in bed;with call bell/phone within reach;with family/visitor present Nurse Communication: Mobility status PT Visit Diagnosis: Other symptoms and signs involving the nervous system (T24.580)    Time: 9983-3825 PT Time Calculation (min) (ACUTE ONLY): 17 min   Charges:   PT Evaluation $PT Eval Low Complexity: 1 Low     PT G Codes:        Alben Deeds, PT DPT  Board Certified Neurologic Specialist [place] 03/06/2018, 8:53 AM

## 2018-03-06 NOTE — Progress Notes (Signed)
Patient ready for discharge to home per orders; discharge instructions given and reviewed; Rx sent electronically; patient discharged out via wheelchair.

## 2018-03-06 NOTE — Discharge Summary (Signed)
Physician Discharge Summary  Ryan [name] DJM:[id number] DOB: Sep 19, 1956 DOA: 03/05/2018  PCP: Noralee Space, MD  Admit date: 03/05/2018 Discharge date: 03/06/2018  Admitted From: Home Disposition: Home  Recommendations for Outpatient Follow-up:  1. Follow up with PCP in 1-2 weeks 2. Please obtain BMP/CBC in one week your next doctors visit.  3. Aspirin changed to Plavix 4. Recommend outpatient sleep study  Home Health: None Equipment/Devices: None Discharge Condition: Stable CODE STATUS: Full code Diet recommendation: 2 g salt diet  Brief/Interim Summary: 61 year old male with history of obesity, essential hypertension, hyperlipidemia, fatty liver, former smoker came to the emergency department with complaints of visual disturbances and slurred speech.  Patient stated this started few days ago and had somewhat gotten worse therefore came to the ER for evaluation.  He was admitted to the hospital due to concerns of CVA.  CT of the head was negative, he had MRI of the brain done which was also negative.  Carotid artery ultrasound showed bilateral 1-39% stenosis.  He was evaluated by physical therapy by the following morning and he did well with them.  His symptoms had resolved and felt back to his baseline. He was evaluated by neuro while in the hospital as well.   At this point patient has reached maximum benefit from an hospital stay therefore stable to be discharged with outpatient follow-up recommendations as stated above.  It was recommended his aspirin to be switched to Plavix.   Discharge Diagnoses:  Principal Problem:   TIA (transient ischemic attack) Active Problems:   Morbid obesity ([place])   Obstructive sleep apnea syndrome, severe   Hypertension   Slurred speech   Diastolic dysfunction  Visual disturbances/dizziness concerning for TIA/CVA -Admitted to rule out CVA.  CT of the head negative,- MRI of the brain is negative, carotid Dopplers showed 1-39% stenosis, A1c was  5.9, LDL 67 -- He did well with physical therapy.  Per neuro recommendations his aspirin was changed to Plavix-advised to follow-up outpatient with primary care physician  Essential hypertension -Resume home medications  Morbid obesity with BMI greater than 58.2 - Counseled on outpatient weight loss diet and exercise.  He has intentionally lost about 40 pounds in last 12 months  Also recommended he would benefit from outpatient sleep study  Discharge Instructions   Allergies as of 03/06/2018   No Known Allergies     Medication List    STOP taking these medications   acetaminophen 500 MG tablet Commonly known as:  TYLENOL   ALEVE 220 MG Caps Generic drug:  Naproxen Sodium   aspirin 325 MG tablet   CIALIS 20 MG tablet Generic drug:  tadalafil   Testosterone 12.5 MG/ACT (1%) Gel     TAKE these medications   atorvastatin 40 MG tablet Commonly known as:  LIPITOR Take 1 tablet (40 mg total) by mouth daily.   clopidogrel 75 MG tablet Commonly known as:  PLAVIX Take 1 tablet (75 mg total) by mouth daily.   furosemide 20 MG tablet Commonly known as:  LASIX TAKE 1 TABLET DAILY EVERY MORNING   furosemide 40 MG tablet Commonly known as:  LASIX Take 1 tablet (40 mg total) by mouth daily.   losartan 100 MG tablet Commonly known as:  COZAAR Take 1 tablet (100 mg total) by mouth daily.   metoprolol succinate 100 MG 24 hr tablet Commonly known as:  TOPROL-XL Take 1 tablet (100 mg total) by mouth daily. Take with or immediately following a meal.   omeprazole 20  MG tablet Commonly known as:  PRILOSEC OTC Take 20 mg by mouth daily. 30 minutes before a meal AS NEEDED       No Known Allergies  You were cared for by a hospitalist during your hospital stay. If you have any questions about your discharge medications or the care you received while you were in the hospital after you are discharged, you can call the unit and asked to speak with the hospitalist on call if the  hospitalist that took care of you is not available. Once you are discharged, your primary care physician will handle any further medical issues. Please note that no refills for any discharge medications will be authorized once you are discharged, as it is imperative that you return to your primary care physician (or establish a relationship with a primary care physician if you do not have one) for your aftercare needs so that they can reassess your need for medications and monitor your lab values.  Consultations:  Neurology   Procedures/Studies: Ct Head Wo Contrast  Result Date: 03/05/2018 CLINICAL DATA:  TIA, blurred vision and dizziness. History of hypertension and diabetes EXAM: CT HEAD WITHOUT CONTRAST TECHNIQUE: Contiguous axial images were obtained from the base of the skull through the vertex without intravenous contrast. COMPARISON:  None. FINDINGS: Brain: No evidence of acute infarction, hemorrhage, hydrocephalus, extra-axial collection or mass lesion/mass effect. Vascular: Negative for hyperdense vessel Skull: Negative Sinuses/Orbits: Negative Other: None IMPRESSION: Negative CT head Electronically Signed   By: Franchot Gallo M.D.   On: 03/05/2018 15:00   Mr Brain Wo Contrast  Result Date: 03/05/2018 CLINICAL DATA:  TIA. History of hypertension, hyperlipidemia, slurred speech. EXAM: MRI HEAD WITHOUT CONTRAST MRA HEAD WITHOUT CONTRAST TECHNIQUE: Multiplanar, multiecho pulse sequences of the brain and surrounding structures were obtained without intravenous contrast. Angiographic images of the head were obtained using MRA technique without contrast. COMPARISON:  CT HEAD Mar 05, 2018 FINDINGS: MRI HEAD FINDINGS INTRACRANIAL CONTENTS: No reduced diffusion to suggest acute ischemia. No susceptibility artifact to suggest hemorrhage. The ventricles and sulci are normal for patient's age. Patchy supratentorial white matter FLAIR T2 hyperintensities. No suspicious parenchymal signal, masses, mass  effect. No abnormal extra-axial fluid collections. No extra-axial masses. VASCULAR: Normal major intracranial vascular flow voids present at skull base. SKULL AND UPPER CERVICAL SPINE: No abnormal sellar expansion. No suspicious calvarial bone marrow signal. Craniocervical junction maintained. SINUSES/ORBITS: Small RIGHT maxillary mucosal retention cyst. Mastoid air cells are well aerated.The included ocular globes and orbital contents are non-suspicious. OTHER: None. MRA HEAD FINDINGS ANTERIOR CIRCULATION: Normal flow related enhancement of the included cervical, petrous, cavernous and supraclinoid internal carotid arteries. RIGHT paraophthalmic artery infundibulum. Patent anterior communicating artery. Patent anterior and middle cerebral arteries, including distal segments. No large vessel occlusion, flow limiting stenosis, aneurysm. POSTERIOR CIRCULATION: vertebral artery is dominant. Basilar artery is patent, with normal flow related enhancement of the main branch vessels. Patent posterior cerebral arteries. No large vessel occlusion, flow limiting stenosis,  aneurysm. ANATOMIC VARIANTS: None. Source images and MIP images were reviewed. IMPRESSION: MRI HEAD: 1. No acute intracranial process. 2. Mild-to-moderate chronic small vessel ischemic changes. MRA HEAD: 1. Negative noncontrast MRA head. Electronically Signed   By: Elon Alas M.D.   On: 03/05/2018 17:25   Mr Jodene Nam Head Wo Contrast  Result Date: 03/05/2018 CLINICAL DATA:  TIA. History of hypertension, hyperlipidemia, slurred speech. EXAM: MRI HEAD WITHOUT CONTRAST MRA HEAD WITHOUT CONTRAST TECHNIQUE: Multiplanar, multiecho pulse sequences of the brain and surrounding structures were obtained without  intravenous contrast. Angiographic images of the head were obtained using MRA technique without contrast. COMPARISON:  CT HEAD Mar 05, 2018 FINDINGS: MRI HEAD FINDINGS INTRACRANIAL CONTENTS: No reduced diffusion to suggest acute ischemia. No  susceptibility artifact to suggest hemorrhage. The ventricles and sulci are normal for patient's age. Patchy supratentorial white matter FLAIR T2 hyperintensities. No suspicious parenchymal signal, masses, mass effect. No abnormal extra-axial fluid collections. No extra-axial masses. VASCULAR: Normal major intracranial vascular flow voids present at skull base. SKULL AND UPPER CERVICAL SPINE: No abnormal sellar expansion. No suspicious calvarial bone marrow signal. Craniocervical junction maintained. SINUSES/ORBITS: Small RIGHT maxillary mucosal retention cyst. Mastoid air cells are well aerated.The included ocular globes and orbital contents are non-suspicious. OTHER: None. MRA HEAD FINDINGS ANTERIOR CIRCULATION: Normal flow related enhancement of the included cervical, petrous, cavernous and supraclinoid internal carotid arteries. RIGHT paraophthalmic artery infundibulum. Patent anterior communicating artery. Patent anterior and middle cerebral arteries, including distal segments. No large vessel occlusion, flow limiting stenosis, aneurysm. POSTERIOR CIRCULATION: vertebral artery is dominant. Basilar artery is patent, with normal flow related enhancement of the main branch vessels. Patent posterior cerebral arteries. No large vessel occlusion, flow limiting stenosis,  aneurysm. ANATOMIC VARIANTS: None. Source images and MIP images were reviewed. IMPRESSION: MRI HEAD: 1. No acute intracranial process. 2. Mild-to-moderate chronic small vessel ischemic changes. MRA HEAD: 1. Negative noncontrast MRA head. Electronically Signed   By: Elon Alas M.D.   On: 03/05/2018 17:25      Subjective: No complaints.  Patient is feeling better.  Would like to go home.  HEENT/EYES = negative for pain, redness, loss of vision, double vision, blurred vision, loss of hearing, sore throat, hoarseness, dysphagia Cardiovascular= negative for chest pain, palpitation, murmurs, lower extremity swelling Respiratory/lungs=  negative for shortness of breath, cough, hemoptysis, wheezing, mucus production Gastrointestinal= negative for nausea, vomiting,, abdominal pain, melena, hematemesis Genitourinary= negative for Dysuria, Hematuria, Change in Urinary Frequency MSK = Negative for arthralgia, myalgias, Back Pain, Joint swelling  Neurology= Negative for headache, seizures, numbness, tingling  Psychiatry= Negative for anxiety, depression, suicidal and homocidal ideation Allergy/Immunology= Medication/Food allergy as listed  Skin= Negative for Rash, lesions, ulcers, itching   Discharge Exam: Vitals:   03/06/18 0312 03/06/18 0824  BP: (!) 152/98 (!) 162/108  Pulse: 74 85  Resp: 20 20  Temp: (!) 97.5 F (36.4 C) (!) 97.5 F (36.4 C)  SpO2: 94% 98%   Vitals:   03/05/18 1940 03/05/18 2348 03/06/18 0312 03/06/18 0824  BP: (!) 145/99 (!) 149/96 (!) 152/98 (!) 162/108  Pulse: 71 88 74 85  Resp: 19 (!) 21 20 20   Temp: 98.1 F (36.7 C) 97.6 F (36.4 C) (!) 97.5 F (36.4 C) (!) 97.5 F (36.4 C)  TempSrc: Oral Oral Oral Oral  SpO2: (!) 80% 95% 94% 98%  Weight:        General: Pt is alert, awake, not in acute distress Cardiovascular: RRR, S1/S2 +, no rubs, no gallops Respiratory: CTA bilaterally, no wheezing, no rhonchi Abdominal: Soft, NT, ND, bowel sounds + Extremities: no edema, no cyanosis    The results of significant diagnostics from this hospitalization (including imaging, microbiology, ancillary and laboratory) are listed below for reference.     Microbiology: No results found for this or any previous visit (from the past 240 hour(s)).   Labs: BNP (last 3 results) No results for input(s): BNP in the last 8760 hours. Basic Metabolic Panel: Recent Labs  Lab 03/05/18 1305  NA 139  K 4.0  CL 101  CO2 26  GLUCOSE 99  BUN 14  CREATININE 1.10  CALCIUM 9.0   Liver Function Tests: Recent Labs  Lab 03/05/18 1305  AST 19  ALT 20  ALKPHOS 79  BILITOT 0.8  PROT 6.9  ALBUMIN 4.1    No results for input(s): LIPASE, AMYLASE in the last 168 hours. No results for input(s): AMMONIA in the last 168 hours. CBC: Recent Labs  Lab 03/05/18 1305  WBC 12.2*  HGB 14.8  HCT 43.8  MCV 87.3  PLT 256   Cardiac Enzymes: No results for input(s): CKTOTAL, CKMB, CKMBINDEX, TROPONINI in the last 168 hours. BNP: Invalid input(s): POCBNP CBG: No results for input(s): GLUCAP in the last 168 hours. D-Dimer No results for input(s): DDIMER in the last 72 hours. Hgb A1c Recent Labs    03/05/18 1846  HGBA1C 5.9*   Lipid Profile Recent Labs    03/05/18 1846  CHOL 137  HDL 43  LDLCALC 67  TRIG 134  CHOLHDL 3.2   Thyroid function studies No results for input(s): TSH, T4TOTAL, T3FREE, THYROIDAB in the last 72 hours.  Invalid input(s): FREET3 Anemia work up No results for input(s): VITAMINB12, FOLATE, FERRITIN, TIBC, IRON, RETICCTPCT in the last 72 hours. Urinalysis    Component Value Date/Time   COLORURINE YELLOW 03/05/2018 1351   APPEARANCEUR CLEAR 03/05/2018 1351   LABSPEC 1.018 03/05/2018 1351   PHURINE 5.0 03/05/2018 1351   GLUCOSEU NEGATIVE 03/05/2018 1351   GLUCOSEU NEGATIVE 02/16/2012 0757   HGBUR NEGATIVE 03/05/2018 1351   BILIRUBINUR NEGATIVE 03/05/2018 1351   KETONESUR NEGATIVE 03/05/2018 1351   PROTEINUR NEGATIVE 03/05/2018 1351   UROBILINOGEN 0.2 02/16/2012 0757   NITRITE NEGATIVE 03/05/2018 1351   LEUKOCYTESUR NEGATIVE 03/05/2018 1351   Sepsis Labs Invalid input(s): PROCALCITONIN,  WBC,  LACTICIDVEN Microbiology No results found for this or any previous visit (from the past 240 hour(s)).   Time coordinating discharge:  I have spent 35 minutes face to face with the patient and on the ward discussing the patients care, assessment, plan and disposition with other care givers. >50% of the time was devoted counseling the patient about the risks and benefits of treatment/Discharge disposition and coordinating care.   SIGNED:   Damita Lack,  MD  [facility] 03/06/2018, 9:10 AM Pager   If 7PM-7AM, please contact night-coverage [email or web address] Password [email or web address]

## 2018-03-06 NOTE — Plan of Care (Signed)
Pt has been discharged before being seen. No charge submitted.   Rosalin Hawking, MD PhD Stroke Neurology 03/06/2018 11:59 AM

## 2018-03-06 NOTE — Evaluation (Signed)
Occupational Therapy Evaluation Patient Details Name: Ryan [name] MRN: [id number] DOB: 1955/12/02 Today's Date: 03/06/2018    History of Present Illness 62 y.o. male with past medical history listed below including hypertension, hyperlipidemia, diastolic heart failure on Lasix, obesity who presents to the emergency department for vision deficits and slurred speech that was sudden onset approximately 1 hour prior to arrival   Clinical Impression   Pt admitted with the above diagnoses. Pt independent at baseline with ADLs, works for [employer]. Pt is at baseline with ADLs. No further OT needs indicated. OT signing off.     Follow Up Recommendations  No OT follow up    Equipment Recommendations  None recommended by OT    Recommendations for Other Services       Precautions / Restrictions        Mobility Bed Mobility Overal bed mobility: Independent                Transfers Overall transfer level: Independent                    Balance Overall balance assessment: Mild deficits observed, not formally tested                           High level balance activites: Side stepping;Backward walking;Direction changes;Turns;Sudden stops;Head turns High Level Balance Comments: No over LOB ntoed           ADL either performed or assessed with clinical judgement   ADL Overall ADL's : At baseline;Independent                                       General ADL Comments: Discussed strategies for managining ADLs and IADLs (works for [employer]) safely while managing dizzy spells. Spouse present.      Vision Patient Visual Report: No change from baseline Additional Comments: pt reports blurriness has resolved     Perception     Praxis      Pertinent Vitals/Pain Pain Assessment: No/denies pain     Hand Dominance Right   Extremity/Trunk Assessment Upper Extremity Assessment Upper Extremity Assessment: Overall WFL for tasks  assessed   Lower Extremity Assessment Lower Extremity Assessment: Defer to PT evaluation   Cervical / Trunk Assessment Cervical / Trunk Assessment: (increased body habitus)   Communication Communication Communication: No difficulties   Cognition Arousal/Alertness: Awake/alert Behavior During Therapy: WFL for tasks assessed/performed Overall Cognitive Status: Within Functional Limits for tasks assessed                                     General Comments       Exercises     Shoulder Instructions      Home Living Family/patient expects to be discharged to:: Private residence Living Arrangements: Spouse/significant other Available Help at Discharge: Family Type of Home: House Home Access: Stairs to enter Technical brewer of Steps: 2 Entrance Stairs-Rails: None       Bathroom Shower/Tub: Walk-in shower         Home Equipment: Kasandra Knudsen - single point          Prior Functioning/Environment Level of Independence: Independent        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:  OT Treatment/Interventions:      OT Goals(Current goals can be found in the care plan section) Acute Rehab OT Goals Patient Stated Goal: home and to figure out what's causing dizziness  OT Frequency:     Barriers to D/C:            Co-evaluation              AM-PAC PT "6 Clicks" Daily Activity     Outcome Measure Help from another person eating meals?: None Help from another person taking care of personal grooming?: None Help from another person toileting, which includes using toliet, bedpan, or urinal?: None Help from another person bathing (including washing, rinsing, drying)?: None Help from another person to put on and taking off regular upper body clothing?: None Help from another person to put on and taking off regular lower body clothing?: None 6 Click Score: 24   End of Session    Activity Tolerance: Patient tolerated  treatment well Patient left: in bed;with call bell/phone within reach;with family/visitor present(EOB)  OT Visit Diagnosis: Unsteadiness on feet (R26.81)                Time: 8937-3428 OT Time Calculation (min): 20 min Charges:  OT General Charges $OT Visit: 1 Visit OT Evaluation $OT Eval Low Complexity: 1 Low G-Codes:       Hortencia Pilar 03/06/2018, 10:33 AM

## 2018-03-19 ENCOUNTER — Telehealth: Payer: Self-pay | Admitting: Pulmonary Disease

## 2018-03-19 NOTE — Telephone Encounter (Signed)
Received a PA form for pt's Androgel. This has been filled out the best of my ability. Form will be placed on Dr. Jeannine Kitten cart to be signed. Will route message to [facility] for follow up.

## 2018-03-22 ENCOUNTER — Telehealth: Payer: Self-pay | Admitting: Pulmonary Disease

## 2018-03-22 NOTE — Telephone Encounter (Signed)
Called and spoke with patient, he states that Dr. Marlou Porch with cardiology is booked out until July. I advised patient that he can contact cardiology and asked to be scheduled with another physician within cardiology if there was something sooner. Patient states that he will call cardiology and ask to see someone else. Nothing needed from [place] at this time.

## 2018-03-30 NOTE — Telephone Encounter (Signed)
PA denied. Will follow up with Insurance for preferred medications.

## 2018-03-31 NOTE — Telephone Encounter (Signed)
Called and spoke with Ander Purpura at [employer].  PA reinitiated for Testosterone Gel Pump.  PA approved for 03/01/18-03/31/2019.  Left detailed message on voicemail for Patient about medication/PA approval.

## 2018-04-01 ENCOUNTER — Encounter

## 2018-04-01 ENCOUNTER — Ambulatory Visit (INDEPENDENT_AMBULATORY_CARE_PROVIDER_SITE_OTHER): Payer: Managed Care, Other (non HMO) | Admitting: Cardiology

## 2018-04-01 ENCOUNTER — Encounter: Payer: Self-pay | Admitting: Cardiology

## 2018-04-01 DIAGNOSIS — Z79899 Other long term (current) drug therapy: Secondary | ICD-10-CM

## 2018-04-01 DIAGNOSIS — I10 Essential (primary) hypertension: Secondary | ICD-10-CM

## 2018-04-01 DIAGNOSIS — I77810 Thoracic aortic ectasia: Secondary | ICD-10-CM

## 2018-04-01 DIAGNOSIS — G4733 Obstructive sleep apnea (adult) (pediatric): Secondary | ICD-10-CM

## 2018-04-01 MED ORDER — SPIRONOLACTONE 25 MG PO TABS: 25.0000 mg | ORAL_TABLET | Freq: Every day | ORAL | 3 refills | Status: DC

## 2018-04-01 MED ORDER — LOSARTAN POTASSIUM 100 MG PO TABS: 100.0000 mg | ORAL_TABLET | Freq: Every day | ORAL | 3 refills | Status: DC

## 2018-04-01 NOTE — Progress Notes (Signed)
Cardiology Office Note:    Date:  04/01/2018   ID:  Ryan [name], DOB [birth year], MRN [id number]  PCP:  Noralee Space, MD  Cardiologist:  No primary care provider on file.   Referring MD: Noralee Space, MD    History of Present Illness:    Ryan [name] is a 62 y.o. male here for the evaluation of essential hypertension dilated aortic root 42 mm ascending at the request of Dr. Lenna Gilford.  He has had persistent high blood pressure on 3 medications.  Last his Lasix was increased to 40 a day metoprolol extended release 100 and losartan 100.  We were asked for recommendations.  Moderate LVH on echocardiogram.  Declined bariatric surgery.  Has a history of obesity hypertension hyperlipidemia fatty liver disease former smoker who was discharged from the hospital on 03/06/2018 due to concerns of stroke. Vision changed when driving, sounded funny on the phone.  CT as well as MRI were negative.  Minor carotid artery stenosis.  Swimming 4-5 days a week. [employer].   Past Medical History:  Diagnosis Date  . Anxiety   . Arthritis    "knees" (03/05/2018)  . Cigarette smoker   . Diastolic dysfunction   . Diverticulosis of colon   . DJD (degenerative joint disease)   . Fatty liver disease, nonalcoholic   . Hemorrhoids   . Hx of colonic polyps   . Hypercholesteremia   . Hypertension   . Obesity   . Seborrheic dermatitis   . Slurred speech   . Stenosis of rectum and anus   . TIA (transient ischemic attack) 03/05/2018  . Venous insufficiency     Past Surgical History:  Procedure Laterality Date  . COLONOSCOPY W/ POLYPECTOMY  06/27/2009  . FLEXIBLE SIGMOIDOSCOPY    . KNEE ARTHROSCOPY Right 11/2003   Dr. Gladstone Lighter  . KNEE ARTHROSCOPY Left 05/24/2012   torn meniscus   . TONSILLECTOMY      Current Medications: Current Meds  Medication Sig  . aspirin 325 MG EC tablet Take 325 mg by mouth daily.  Marland Kitchen atorvastatin (LIPITOR) 40 MG tablet Take 1 tablet (40 mg total) by mouth daily.  .  furosemide (LASIX) 40 MG tablet Take 1 tablet (40 mg total) by mouth daily.  Marland Kitchen losartan (COZAAR) 100 MG tablet Take 1 tablet (100 mg total) by mouth daily.  . metoprolol succinate (TOPROL-XL) 100 MG 24 hr tablet Take 1 tablet (100 mg total) by mouth daily. Take with or immediately following a meal.  . omeprazole (PRILOSEC OTC) 20 MG tablet Take 20 mg by mouth daily. 30 minutes before a meal AS NEEDED  . [DISCONTINUED] losartan (COZAAR) 100 MG tablet Take 1 tablet (100 mg total) by mouth daily.     Allergies:   Patient has no known allergies.   Social History   Socioeconomic History  . Marital status: Widowed    Spouse name: Not on file  . Number of children: 2  . Years of education: Not on file  . Highest education level: Not on file  Occupational History  . Not on file  Social Needs  . Financial resource strain: Not on file  . Food insecurity:    Worry: Not on file    Inability: Not on file  . Transportation needs:    Medical: Not on file    Non-medical: Not on file  Tobacco Use  . Smoking status: Former Smoker    Packs/day: 1.00    Years: 27.00  Pack years: 27.00    Types: Cigarettes    Last attempt to quit: 10/20/2009    Years since quitting: 8.4  . Smokeless tobacco: Never Used  Substance and Sexual Activity  . Alcohol use: Yes    Alcohol/week: 4.8 oz    Types: 8 Cans of beer per week  . Drug use: Never  . Sexual activity: Not Currently  Lifestyle  . Physical activity:    Days per week: Not on file    Minutes per session: Not on file  . Stress: Not on file  Relationships  . Social connections:    Talks on phone: Not on file    Gets together: Not on file    Attends religious service: Not on file    Active member of club or organization: Not on file    Attends meetings of clubs or organizations: Not on file    Relationship status: Not on file  Other Topics Concern  . Not on file  Social History Narrative  . Not on file     Family History: The patient's  family history includes Hypertension in his father and mother.  ROS:   Please see the history of present illness.    No fevers chills nausea vomiting syncope bleeding all other systems reviewed and are negative.  EKGs/Labs/Other Studies Reviewed:    The following studies were reviewed today: Echocardiogram 01/20/2017 - Left ventricle: The cavity size was normal. There was moderate   concentric hypertrophy. Systolic function was normal. The   estimated ejection fraction was in the range of 55% to 60%. Wall   motion was normal; there were no regional wall motion   abnormalities. Doppler parameters are consistent with abnormal   left ventricular relaxation (grade 1 diastolic dysfunction).   Doppler parameters are consistent with elevated ventricular   end-diastolic filling pressure. - Aortic valve: There was no regurgitation. - Ascending aorta: The ascending aorta was mildly dilated measuring   42 mm. - Left atrium: The atrium was moderately dilated. - Right ventricle: Systolic function was normal. - Tricuspid valve: There was trivial regurgitation. - Pulmonic valve: There was no regurgitation. - Pulmonary arteries: Systolic pressure was within the normal   range. - Inferior vena cava: The vessel was normal in size. - Pericardium, extracardiac: There was no pericardial effusion.  03/05/18 carotid ultrasound  -Bilateral mild carotid artery disease  EKG: 03/05/2018-sinus rhythm PACs no other abnormalities  Recent Labs: 02/09/2018: TSH 4.11 03/05/2018: ALT 20; BUN 14; Creatinine, Ser 1.10; Hemoglobin 14.8; Platelets 256; Potassium 4.0; Sodium 139   LDL 67  Recent Lipid Panel    Component Value Date/Time   CHOL 137 03/05/2018 1846   TRIG 134 03/05/2018 1846   HDL 43 03/05/2018 1846   CHOLHDL 3.2 03/05/2018 1846   VLDL 27 03/05/2018 1846   LDLCALC 67 03/05/2018 1846   LDLDIRECT 181.0 12/31/2016 1150    Physical Exam:    VS:  BP (!) 152/104 (BP Location: Right Arm, Patient  Position: Sitting, Cuff Size: Large)   Pulse (!) 102   Ht 5\' 11"  (1.803 m)   Wt (!) 397 lb 12.8 oz (180.4 kg)   SpO2 96%   BMI 55.48 kg/m     Wt Readings from Last 3 Encounters:  04/01/18 (!) 397 lb 12.8 oz (180.4 kg)  03/05/18 (!) 406 lb (184.2 kg)  02/09/18 (!) 406 lb 6.4 oz (184.3 kg)     GEN:  Well nourished, well developed in no acute distress, obese HEENT: Normal NECK:  No JVD; No carotid bruits LYMPHATICS: No lymphadenopathy CARDIAC: RRR, no murmurs, rubs, gallops RESPIRATORY:  Clear to auscultation without rales, wheezing or rhonchi  ABDOMEN: Soft, non-tender, non-distended MUSCULOSKELETAL:  1+ BLE edema; No deformity  SKIN: Warm and dry NEUROLOGIC:  Alert and oriented x 3 PSYCHIATRIC:  Normal affect   ASSESSMENT:    1. Morbid obesity ([place])   2. Essential hypertension   3. Long-term use of high-risk medication   4. Dilated aortic root ([place])   5. OSA (obstructive sleep apnea)    PLAN:    In order of problems listed above:  Morbid obesity - Continue to encourage weight loss.  This is the crux of his medical issues.  He does not wish to go forward with bariatric surgery, this is fine.  He is exercising at the gym, swimming.  Low-carb diet recommended.  Essential hypertension - Weight loss.  Medications reviewed.  I will go ahead and add spironolactone 25 mg.  Check basic minimal profile in 1 week.  Have him establish with hypertension clinic.  Next step may be amlodipine.  Dilated aortic root -Aortic root 42 mm, mildly dilated.  This is likely result as well of his morbid obesity and extra stress and strain on his vascular system.  Continue to monitor with echocardiogram in the future  Carotid artery disease -Mild plaque bilaterally.  Continue with secondary prevention efforts.  On aspirin 325 with recent episode, possible TIA.  Obstructive sleep apnea -Severe from sleep study 02/04/2017.  CPAP.   Medication Adjustments/Labs and Tests Ordered: Current  medicines are reviewed at length with the patient today.  Concerns regarding medicines are outlined above.  Orders Placed This Encounter  Procedures  . Basic metabolic panel   Meds ordered this encounter  Medications  . spironolactone (ALDACTONE) 25 MG tablet    Sig: Take 1 tablet (25 mg total) by mouth daily.    Dispense:  90 tablet    Refill:  3  . losartan (COZAAR) 100 MG tablet    Sig: Take 1 tablet (100 mg total) by mouth daily.    Dispense:  90 tablet    Refill:  3    Patient Instructions  Medication Instructions:  Please start Spironolactone 25 mg a day. Continue all other medications as listed.  Labwork: Please have blood work approximately 10 days after starting Spironolactone.  Follow-Up: Please schedule to follow up with the Hypertension Clinic at their next available appointment.  If you need a refill on your cardiac medications before your next appointment, please call your pharmacy.  Thank you for choosing [facility]!!     Carbohydrate Counting for Diabetes Mellitus, Adult Carbohydrate counting is a method for keeping track of how many carbohydrates you eat. Eating carbohydrates naturally increases the amount of sugar (glucose) in the blood. Counting how many carbohydrates you eat helps keep your blood glucose within normal limits, which helps you manage your diabetes (diabetes mellitus). It is important to know how many carbohydrates you can safely have in each meal. This is different for every person. A diet and nutrition specialist (registered dietitian) can help you make a meal plan and calculate how many carbohydrates you should have at each meal and snack. Carbohydrates are found in the following foods:  Grains, such as breads and cereals.  Dried beans and soy products.  Starchy vegetables, such as potatoes, peas, and corn.  Fruit and fruit juices.  Milk and yogurt.  Sweets and snack foods, such as cake, cookies, candy, chips,  and soft  drinks.  How do I count carbohydrates? There are two ways to count carbohydrates in food. You can use either of the methods or a combination of both. Reading "Nutrition Facts" on packaged food The "Nutrition Facts" list is included on the labels of almost all packaged foods and beverages in the [place] It includes:  The serving size.  Information about nutrients in each serving, including the grams (g) of carbohydrate per serving.  To use the "Nutrition Facts":  Decide how many servings you will have.  Multiply the number of servings by the number of carbohydrates per serving.  The resulting number is the total amount of carbohydrates that you will be having.  Learning standard serving sizes of other foods When you eat foods containing carbohydrates that are not packaged or do not include "Nutrition Facts" on the label, you need to measure the servings in order to count the amount of carbohydrates:  Measure the foods that you will eat with a food scale or measuring cup, if needed.  Decide how many standard-size servings you will eat.  Multiply the number of servings by 15. Most carbohydrate-rich foods have about 15 g of carbohydrates per serving. ? For example, if you eat 8 oz (170 g) of strawberries, you will have eaten 2 servings and 30 g of carbohydrates (2 servings x 15 g = 30 g).  For foods that have more than one food mixed, such as soups and casseroles, you must count the carbohydrates in each food that is included.  The following list contains standard serving sizes of common carbohydrate-rich foods. Each of these servings has about 15 g of carbohydrates:   hamburger bun or  English muffin.   oz (15 mL) syrup.   oz (14 g) jelly.  1 slice of bread.  1 six-inch tortilla.  3 oz (85 g) cooked rice or pasta.  4 oz (113 g) cooked dried beans.  4 oz (113 g) starchy vegetable, such as peas, corn, or potatoes.  4 oz (113 g) hot cereal.  4 oz (113 g) mashed potatoes  or  of a large baked potato.  4 oz (113 g) canned or frozen fruit.  4 oz (120 mL) fruit juice.  4-6 crackers.  6 chicken nuggets.  6 oz (170 g) unsweetened dry cereal.  6 oz (170 g) plain fat-free yogurt or yogurt sweetened with artificial sweeteners.  8 oz (240 mL) milk.  8 oz (170 g) fresh fruit or one small piece of fruit.  24 oz (680 g) popped popcorn.  Example of carbohydrate counting Sample meal  3 oz (85 g) chicken breast.  6 oz (170 g) brown rice.  4 oz (113 g) corn.  8 oz (240 mL) milk.  8 oz (170 g) strawberries with sugar-free whipped topping. Carbohydrate calculation 1. Identify the foods that contain carbohydrates: ? Rice. ? Corn. ? Milk. ? Strawberries. 2. Calculate how many servings you have of each food: ? 2 servings rice. ? 1 serving corn. ? 1 serving milk. ? 1 serving strawberries. 3. Multiply each number of servings by 15 g: ? 2 servings rice x 15 g = 30 g. ? 1 serving corn x 15 g = 15 g. ? 1 serving milk x 15 g = 15 g. ? 1 serving strawberries x 15 g = 15 g. 4. Add together all of the amounts to find the total grams of carbohydrates eaten: ? 30 g + 15 g + 15 g + 15 g = 75  g of carbohydrates total. This information is not intended to replace advice given to you by your health care provider. Make sure you discuss any questions you have with your health care provider. Document Released: 10/06/2005 Document Revised: 04/25/2016 Document Reviewed: 03/19/2016 Elsevier Interactive Patient Education  2018 [employer].     Signed, Candee Furbish, MD  04/01/2018 10:25 AM    [place]

## 2018-04-01 NOTE — Patient Instructions (Addendum)
Medication Instructions:  Please start Spironolactone 25 mg a day. Continue all other medications as listed.  Labwork: Please have blood work approximately 10 days after starting Spironolactone.  Follow-Up: Please schedule to follow up with the Hypertension Clinic at their next available appointment.  If you need a refill on your cardiac medications before your next appointment, please call your pharmacy.  Thank you for choosing [facility]!!     Carbohydrate Counting for Diabetes Mellitus, Adult Carbohydrate counting is a method for keeping track of how many carbohydrates you eat. Eating carbohydrates naturally increases the amount of sugar (glucose) in the blood. Counting how many carbohydrates you eat helps keep your blood glucose within normal limits, which helps you manage your diabetes (diabetes mellitus). It is important to know how many carbohydrates you can safely have in each meal. This is different for every person. A diet and nutrition specialist (registered dietitian) can help you make a meal plan and calculate how many carbohydrates you should have at each meal and snack. Carbohydrates are found in the following foods:  Grains, such as breads and cereals.  Dried beans and soy products.  Starchy vegetables, such as potatoes, peas, and corn.  Fruit and fruit juices.  Milk and yogurt.  Sweets and snack foods, such as cake, cookies, candy, chips, and soft drinks.  How do I count carbohydrates? There are two ways to count carbohydrates in food. You can use either of the methods or a combination of both. Reading "Nutrition Facts" on packaged food The "Nutrition Facts" list is included on the labels of almost all packaged foods and beverages in the [place] It includes:  The serving size.  Information about nutrients in each serving, including the grams (g) of carbohydrate per serving.  To use the "Nutrition Facts":  Decide how many servings you will  have.  Multiply the number of servings by the number of carbohydrates per serving.  The resulting number is the total amount of carbohydrates that you will be having.  Learning standard serving sizes of other foods When you eat foods containing carbohydrates that are not packaged or do not include "Nutrition Facts" on the label, you need to measure the servings in order to count the amount of carbohydrates:  Measure the foods that you will eat with a food scale or measuring cup, if needed.  Decide how many standard-size servings you will eat.  Multiply the number of servings by 15. Most carbohydrate-rich foods have about 15 g of carbohydrates per serving. ? For example, if you eat 8 oz (170 g) of strawberries, you will have eaten 2 servings and 30 g of carbohydrates (2 servings x 15 g = 30 g).  For foods that have more than one food mixed, such as soups and casseroles, you must count the carbohydrates in each food that is included.  The following list contains standard serving sizes of common carbohydrate-rich foods. Each of these servings has about 15 g of carbohydrates:   hamburger bun or  English muffin.   oz (15 mL) syrup.   oz (14 g) jelly.  1 slice of bread.  1 six-inch tortilla.  3 oz (85 g) cooked rice or pasta.  4 oz (113 g) cooked dried beans.  4 oz (113 g) starchy vegetable, such as peas, corn, or potatoes.  4 oz (113 g) hot cereal.  4 oz (113 g) mashed potatoes or  of a large baked potato.  4 oz (113 g) canned or frozen fruit.  4  oz (120 mL) fruit juice.  4-6 crackers.  6 chicken nuggets.  6 oz (170 g) unsweetened dry cereal.  6 oz (170 g) plain fat-free yogurt or yogurt sweetened with artificial sweeteners.  8 oz (240 mL) milk.  8 oz (170 g) fresh fruit or one small piece of fruit.  24 oz (680 g) popped popcorn.  Example of carbohydrate counting Sample meal  3 oz (85 g) chicken breast.  6 oz (170 g) brown rice.  4 oz (113 g)  corn.  8 oz (240 mL) milk.  8 oz (170 g) strawberries with sugar-free whipped topping. Carbohydrate calculation 1. Identify the foods that contain carbohydrates: ? Rice. ? Corn. ? Milk. ? Strawberries. 2. Calculate how many servings you have of each food: ? 2 servings rice. ? 1 serving corn. ? 1 serving milk. ? 1 serving strawberries. 3. Multiply each number of servings by 15 g: ? 2 servings rice x 15 g = 30 g. ? 1 serving corn x 15 g = 15 g. ? 1 serving milk x 15 g = 15 g. ? 1 serving strawberries x 15 g = 15 g. 4. Add together all of the amounts to find the total grams of carbohydrates eaten: ? 30 g + 15 g + 15 g + 15 g = 75 g of carbohydrates total. This information is not intended to replace advice given to you by your health care provider. Make sure you discuss any questions you have with your health care provider. Document Released: 10/06/2005 Document Revised: 04/25/2016 Document Reviewed: 03/19/2016 Elsevier Interactive Patient Education  [employer].

## 2018-04-20 ENCOUNTER — Other Ambulatory Visit: Payer: Managed Care, Other (non HMO)

## 2018-04-21 ENCOUNTER — Other Ambulatory Visit: Payer: Managed Care, Other (non HMO) | Admitting: *Deleted

## 2018-04-21 DIAGNOSIS — Z79899 Other long term (current) drug therapy: Secondary | ICD-10-CM

## 2018-04-21 DIAGNOSIS — I10 Essential (primary) hypertension: Secondary | ICD-10-CM

## 2018-04-21 LAB — BASIC METABOLIC PANEL
BUN/Creatinine Ratio: 19 (ref 10–24)
BUN: 18 mg/dL (ref 8–27)
CALCIUM: 9.8 mg/dL (ref 8.6–10.2)
CHLORIDE: 100 mmol/L (ref 96–106)
CO2: 24 mmol/L (ref 20–29)
Creatinine, Ser: 0.97 mg/dL (ref 0.76–1.27)
GFR calc non Af Amer: 84 mL/min/{1.73_m2} (ref >59)
GFR, EST AFRICAN AMERICAN: 98 mL/min/{1.73_m2} (ref >59)
GLUCOSE: 95 mg/dL (ref 65–99)
POTASSIUM: 4.8 mmol/L (ref 3.5–5.2)
Sodium: 140 mmol/L (ref 134–144)

## 2018-04-23 ENCOUNTER — Other Ambulatory Visit: Payer: Self-pay | Admitting: Pulmonary Disease

## 2018-04-26 ENCOUNTER — Other Ambulatory Visit: Payer: Self-pay | Admitting: Pulmonary Disease

## 2018-04-28 ENCOUNTER — Other Ambulatory Visit: Payer: Managed Care, Other (non HMO)

## 2018-05-11 ENCOUNTER — Ambulatory Visit: Payer: Managed Care, Other (non HMO) | Admitting: Pulmonary Disease

## 2018-05-18 ENCOUNTER — Ambulatory Visit (INDEPENDENT_AMBULATORY_CARE_PROVIDER_SITE_OTHER): Payer: Managed Care, Other (non HMO) | Admitting: Pharmacist

## 2018-05-18 VITALS — BP 130/102 | HR 87

## 2018-05-18 DIAGNOSIS — I10 Essential (primary) hypertension: Secondary | ICD-10-CM | POA: Diagnosis not present

## 2018-05-18 MED ORDER — SPIRONOLACTONE 50 MG PO TABS: 50.0000 mg | ORAL_TABLET | Freq: Every day | ORAL | 3 refills | Status: DC

## 2018-05-18 NOTE — Patient Instructions (Addendum)
Increase your spironolactone to 50mg  daily  Switch your coffee to decaf  Read nutrition labels - try to limit your sodium to less than 2,000mg  a day - try to decrease your soup and Chinese food  Stay active walking and swimming  Monitor your blood pressure at home - your goal is < 130/65mmHg  Follow up in 3 weeks in clinic for blood pressure check and lab work. You can bring in your home blood pressure cuff to this visit

## 2018-05-18 NOTE — Progress Notes (Signed)
Patient ID: Ryan [name]                 DOB: 18-Apr-1956                      MRN: [id number]     HPI: Ryan [name] is a 62 y.o. male referred by Dr. Marlou Porch to HTN clinic. PMH is significant for HTN, morbid obesity, HLD, fatty liver disease, dilated aortic root, mild carotid artery stenosis, possible TIA, severe OSA on CPAP, and former tobacco abuse. Pt was seen in clinic 6 weeks ago and BP was elevated at 152/104. He was started on spironolactone 25mg  daily, f/u BMET was stable. Pt presents today for follow up.  Pt presents today in good spirits. He reports tolerating his spironolactone well. His home BP readings have still been staying elevated 150/100s. He had a BP check of 168/112 last week at the dentist office. When he was given nitrous oxide for his dental work, his BP improved to the 381W systolic. He takes all of his medications in the AM and took his meds 2.5 hours ago. He has not had any caffeine yet today. Typically drinks 2 cups of regular coffee and 2 cups of decaf each day. He uses ibuprofen 3 tabs every morning for knee pain and will occasionally take a PM dose if he is planning to be active.   Current HTN meds: losartan 100mg  daily, Toprol 100mg  daily, spironolactone 25mg  daily, furosemide 40mg  daily  BP goal: <130/31mmHg  Family History: Hypertension in his father and mother.  Social History: Former smoker 1 PPD for 27 years, quit in 2011. Cut back on beer to ~2x a month, denies illicit drug use.  Diet: Watching his portions. Likes canned soup - states he's eating more salt than he should. Likes fruit and salad. Drinks 4 cups of coffee a day - 2 regular and decaf.   Exercise: Swimming 4-5 days a week at [employer].  Home BP readings: 150s/100s at home   Wt Readings from Last 3 Encounters:  04/01/18 (!) 397 lb 12.8 oz (180.4 kg)  03/05/18 (!) 406 lb (184.2 kg)  02/09/18 (!) 406 lb 6.4 oz (184.3 kg)   BP Readings from Last 3 Encounters:  04/01/18 (!) 152/104  03/06/18  (!) 162/108  02/09/18 (!) 166/98   Pulse Readings from Last 3 Encounters:  04/01/18 (!) 102  03/06/18 85  02/09/18 82    Renal function: CrCl cannot be calculated (Patient's most recent lab result is older than the maximum 21 days allowed.).  Past Medical History:  Diagnosis Date  . Anxiety   . Arthritis    "knees" (03/05/2018)  . Cigarette smoker   . Diastolic dysfunction   . Diverticulosis of colon   . DJD (degenerative joint disease)   . Fatty liver disease, nonalcoholic   . Hemorrhoids   . Hx of colonic polyps   . Hypercholesteremia   . Hypertension   . Obesity   . Seborrheic dermatitis   . Slurred speech   . Stenosis of rectum and anus   . TIA (transient ischemic attack) 03/05/2018  . Venous insufficiency     Current Outpatient Medications on File Prior to Visit  Medication Sig Dispense Refill  . aspirin 325 MG EC tablet Take 325 mg by mouth daily.    Marland Kitchen atorvastatin (LIPITOR) 40 MG tablet Take 1 tablet (40 mg total) by mouth daily. 90 tablet 4  . furosemide (LASIX) 40 MG tablet TAKE  1 TABLET DAILY 90 tablet 1  . losartan (COZAAR) 100 MG tablet Take 1 tablet (100 mg total) by mouth daily. 90 tablet 3  . meloxicam (MOBIC) 15 MG tablet TAKE 1 TABLET DAILY AS NEEDED FOR ARTHRITIS PAIN 90 tablet 4  . metoprolol succinate (TOPROL-XL) 100 MG 24 hr tablet TAKE 1 TABLET DAILY 90 tablet 4  . omeprazole (PRILOSEC OTC) 20 MG tablet Take 20 mg by mouth daily. 30 minutes before a meal AS NEEDED    . spironolactone (ALDACTONE) 25 MG tablet Take 1 tablet (25 mg total) by mouth daily. 90 tablet 3   No current facility-administered medications on file prior to visit.     No Known Allergies   Assessment/Plan:  1. Hypertension - BP above goal <130/48mmHg in clinic, however reading better than home readings of 150s/100s. Will increase spironolactone to 50mg  daily and continue losartan 100mg  daily, Toprol 100mg  daily, and furosemide 40mg  daily. Encouraged pt to limit sodium to <  2,000mg  daily and focus on reducing [place] food and soup in diet. Encouraged him to remain active swimming and walking. F/u in HTN clinic in 3 weeks for BP check and BMET.   Akshar Starnes E. Carmeron Heady, PharmD, BCACP, [facility] [address], [place], [place] [address] Phone: [phone]; Fax: [phone] 05/18/2018 9:17 AM

## 2018-05-27 ENCOUNTER — Ambulatory Visit: Payer: Managed Care, Other (non HMO) | Admitting: Pulmonary Disease

## 2018-06-08 ENCOUNTER — Ambulatory Visit: Payer: Managed Care, Other (non HMO)

## 2018-06-09 ENCOUNTER — Ambulatory Visit: Payer: Managed Care, Other (non HMO) | Admitting: Pulmonary Disease

## 2018-08-28 IMAGING — MR MR MRA HEAD W/O CM
9 of 11 series · 31 of 48 positions shown · non-contrast
Comparison: CT HEAD March 05, 2018

CLINICAL DATA: TIA. History of hypertension, hyperlipidemia,
slurred speech.

EXAM:
MRI HEAD WITHOUT CONTRAST
MRA HEAD WITHOUT CONTRAST
TECHNIQUE: Multiplanar, multiecho pulse sequences of the brain and surrounding
structures were obtained without intravenous contrast. Angiographic
images of the head were obtained using MRA technique without
contrast.

[Series 3: DWI · axial · 3.0mm · 0.94mm/px · z∈[-89,+69]mm · 6 of 110 slices shown (1 of 2)]
[im 1/110]
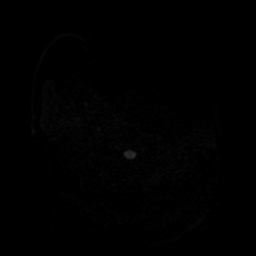
[im 22/110]
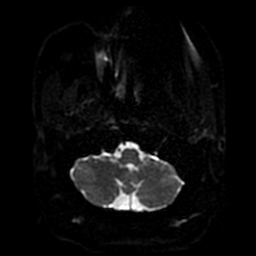
[im 44/110]
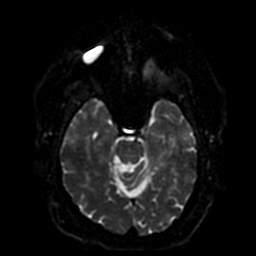
[im 66/110]
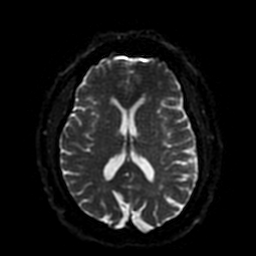
[im 88/110]
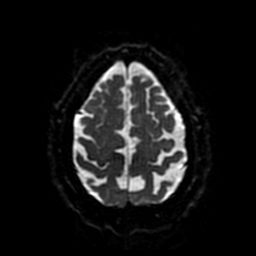
[im 110/110]
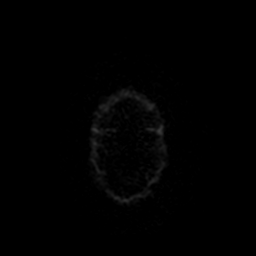

[Series 4: ax (id) 2 · axial · 1.0mm · 0.47mm/px · z∈[-66,-17]mm · 5 of 184 slices shown]
[im 1/184]
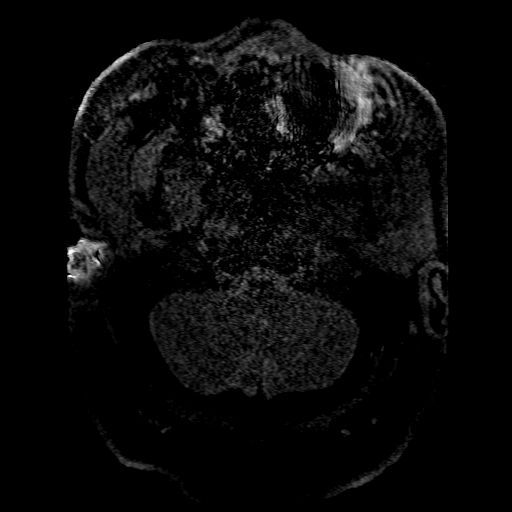
[im 34/184]
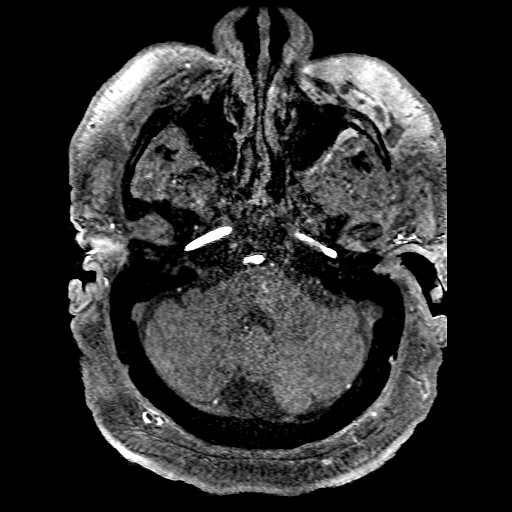
[im 50/184]
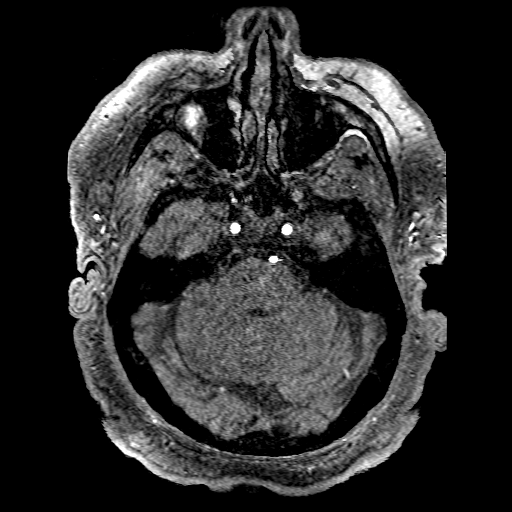
[im 84/184]
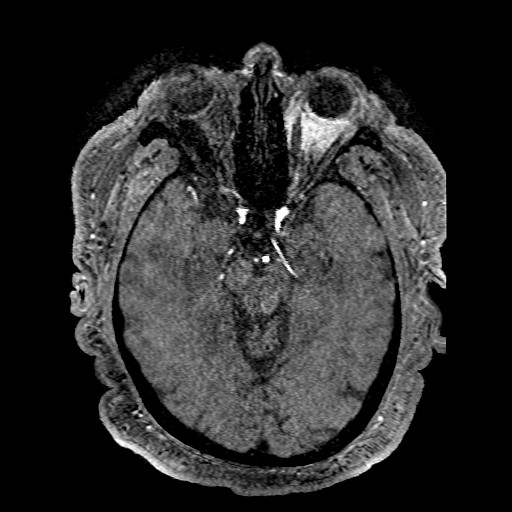
[im 100/184]
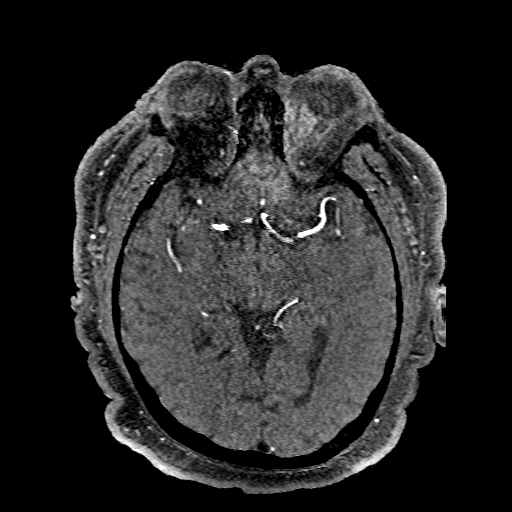

[Series 5: DWI · coronal · 4.0mm · 0.94mm/px · 5 of 82 slices shown (2 of 2)]
[im 1/82]
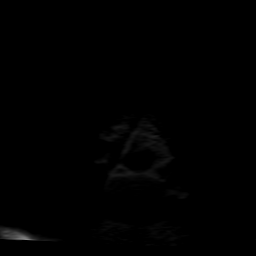
[im 21/82]
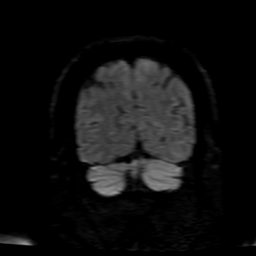
[im 41/82]
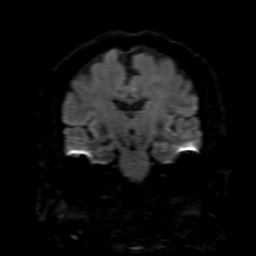
[im 61/82]
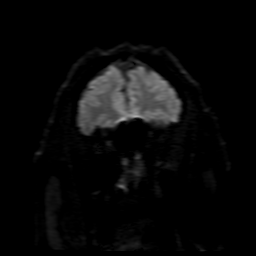
[im 82/82]
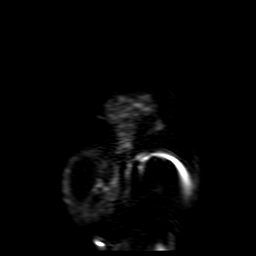

[Series 6: FLAIR · sagittal · 5.0mm · 0.47mm/px · 2 of 23 slices shown (1 of 2)]
[im 1/23]
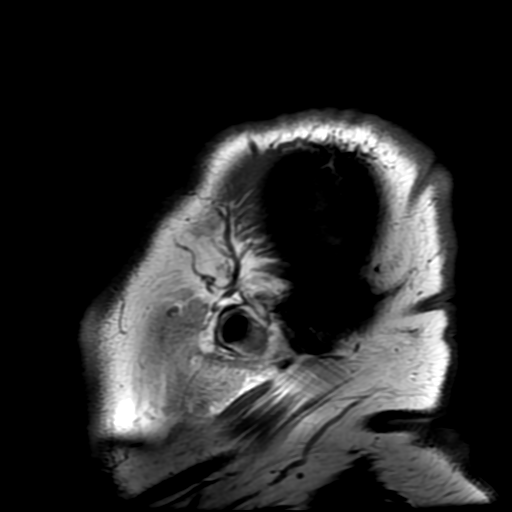
[im 23/23]
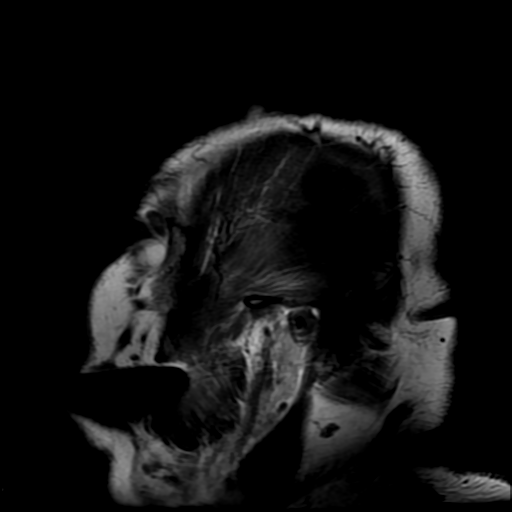

[Series 7: T2 · axial · 5.0mm · 0.47mm/px · z∈[-80,+72]mm · 2 of 27 slices shown (1 of 2)]
[im 1/27]
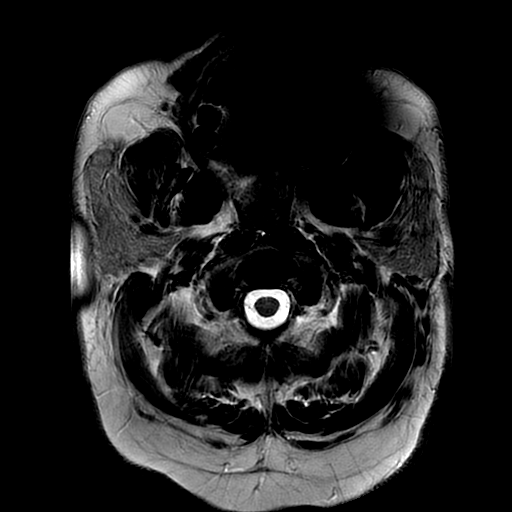
[im 27/27]
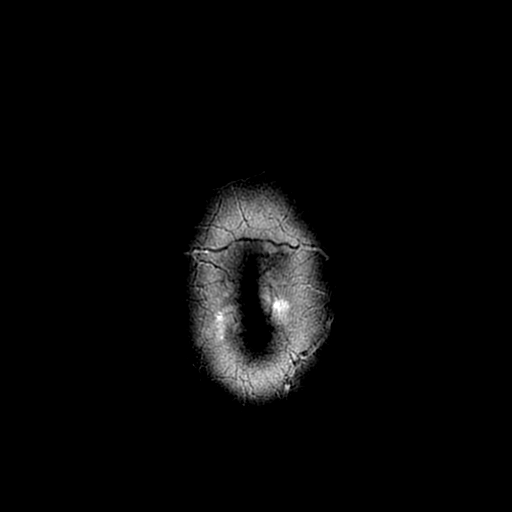

[Series 9: FLAIR · axial · 3.0mm · 0.47mm/px · z∈[-80,+72]mm · 2 of 27 slices shown (2 of 2)]
[im 1/27]
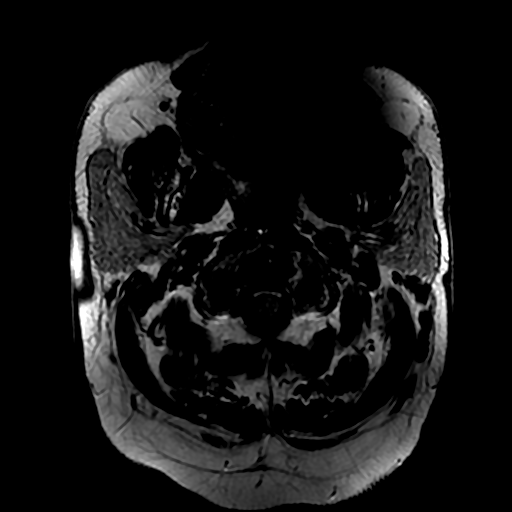
[im 27/27]
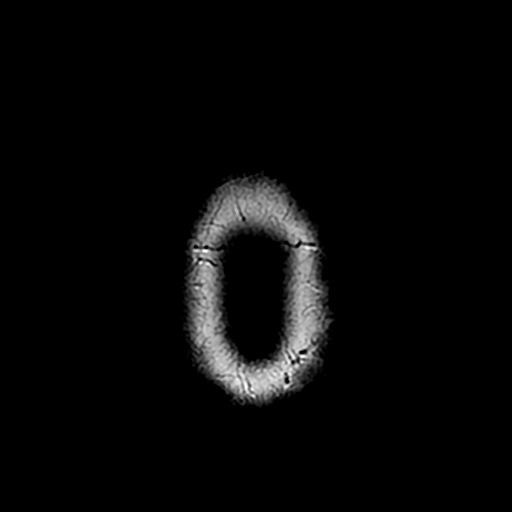

[Series 11: T2 · coronal · 5.0mm · 0.39mm/px · 2 of 26 slices shown (2 of 2)]
[im 1/26]
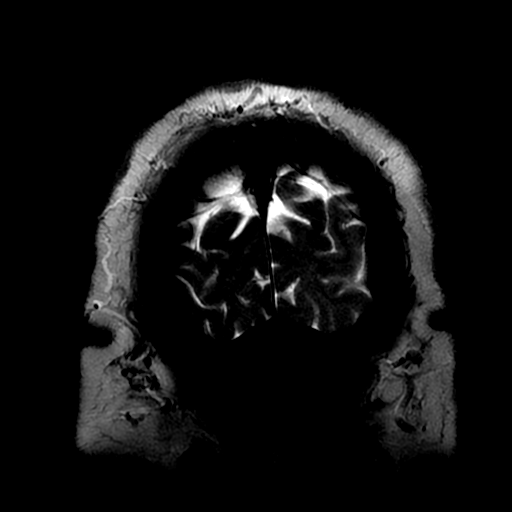
[im 26/26]
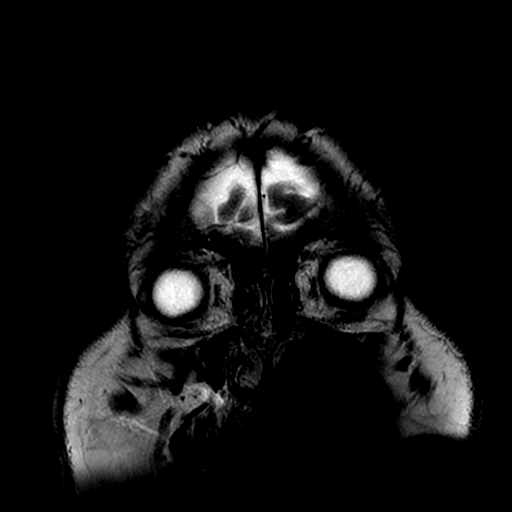

[Series 350: ADC · axial · 3.0mm · 0.94mm/px · z∈[-89,+69]mm · 4 of 54 slices shown (1 of 2)]
[im 1/54]
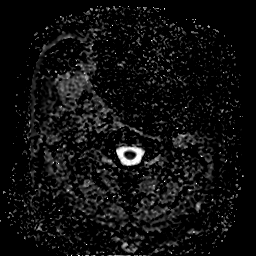
[im 18/54]
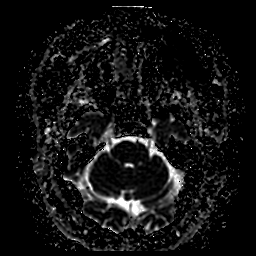
[im 36/54]
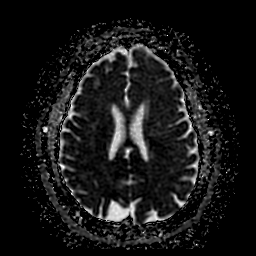
[im 54/54]
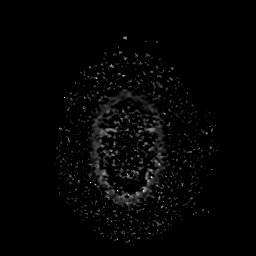

[Series 550: ADC · coronal · 4.0mm · 0.94mm/px · 3 of 41 slices shown (2 of 2)]
[im 1/41]
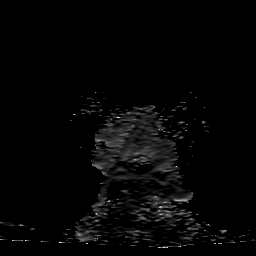
[im 21/41]
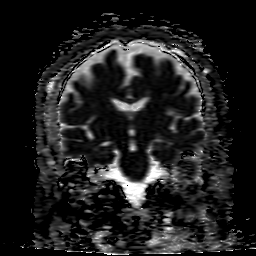
[im 41/41]
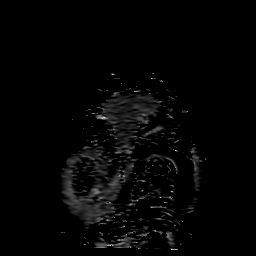

[31 of 48 positions shown; findings below may reference images not displayed]

FINDINGS: MRI HEAD FINDINGS

INTRACRANIAL CONTENTS: No reduced diffusion to suggest acute
ischemia. No susceptibility artifact to suggest hemorrhage. The
ventricles and sulci are normal for patient's age. Patchy
supratentorial white matter FLAIR T2 hyperintensities. No suspicious
parenchymal signal, masses, mass effect. No abnormal extra-axial
fluid collections. No extra-axial masses.

VASCULAR: Normal major intracranial vascular flow voids present at
skull base.

SKULL AND UPPER CERVICAL SPINE: No abnormal sellar expansion. No
suspicious calvarial bone marrow signal. Craniocervical junction
maintained.

SINUSES/ORBITS: Small RIGHT maxillary mucosal retention cyst.
Mastoid air cells are well aerated..The included ocular globes and
orbital contents are non-suspicious.

OTHER: None.

MRA HEAD FINDINGS

ANTERIOR CIRCULATION: Normal flow related enhancement of the
included cervical, petrous, cavernous and supraclinoid internal
carotid arteries. RIGHT paraophthalmic artery infundibulum. Patent
anterior communicating artery. Patent anterior and middle cerebral
arteries, including distal segments.

No large vessel occlusion, flow limiting stenosis, aneurysm.

POSTERIOR CIRCULATION: vertebral artery is dominant. Basilar artery
is patent, with normal flow related enhancement of the main branch
vessels. Patent posterior cerebral arteries.

No large vessel occlusion, flow limiting stenosis,  aneurysm.

ANATOMIC VARIANTS: None.

Source images and MIP images were reviewed.
IMPRESSION: MRI HEAD:

1. No acute intracranial process.
2. Mild-to-moderate chronic small vessel ischemic changes.

MRA HEAD:

1. Negative noncontrast MRA head.

## 2019-03-29 ENCOUNTER — Other Ambulatory Visit: Payer: Self-pay | Admitting: Cardiology

## 2019-05-19 ENCOUNTER — Other Ambulatory Visit: Payer: Self-pay | Admitting: Pulmonary Disease

## 2019-07-20 ENCOUNTER — Other Ambulatory Visit: Payer: Self-pay | Admitting: Pulmonary Disease

## 2023-10-20 ENCOUNTER — Emergency Department (HOSPITAL_COMMUNITY): Payer: Medicare Other

## 2023-10-20 ENCOUNTER — Ambulatory Visit
Admission: EM | Admit: 2023-10-20 | Discharge: 2023-10-20 | Disposition: A | Discharge status: Other Acute Inpatient Hospital | Payer: Medicare Other

## 2023-10-20 ENCOUNTER — Observation Stay (HOSPITAL_COMMUNITY)
Admission: EM | Admit: 2023-10-20 | Discharge: 2023-10-21 | Disposition: A | Discharge status: Home / Self Care | Payer: Medicare Other | Attending: Family Medicine | Admitting: Family Medicine

## 2023-10-20 ENCOUNTER — Other Ambulatory Visit: Payer: Self-pay

## 2023-10-20 ENCOUNTER — Encounter (HOSPITAL_COMMUNITY): Payer: Self-pay

## 2023-10-20 DIAGNOSIS — J069 Acute upper respiratory infection, unspecified: Secondary | ICD-10-CM | POA: Insufficient documentation

## 2023-10-20 DIAGNOSIS — I480 Paroxysmal atrial fibrillation: Principal | ICD-10-CM | POA: Insufficient documentation

## 2023-10-20 DIAGNOSIS — I4891 Unspecified atrial fibrillation: Secondary | ICD-10-CM

## 2023-10-20 DIAGNOSIS — J09X9 Influenza due to identified novel influenza A virus with other manifestations: Secondary | ICD-10-CM | POA: Diagnosis not present

## 2023-10-20 DIAGNOSIS — Z7901 Long term (current) use of anticoagulants: Secondary | ICD-10-CM | POA: Diagnosis not present

## 2023-10-20 DIAGNOSIS — I4819 Other persistent atrial fibrillation: Secondary | ICD-10-CM | POA: Diagnosis not present

## 2023-10-20 DIAGNOSIS — I10 Essential (primary) hypertension: Secondary | ICD-10-CM | POA: Diagnosis present

## 2023-10-20 DIAGNOSIS — I214 Non-ST elevation (NSTEMI) myocardial infarction: Secondary | ICD-10-CM | POA: Diagnosis not present

## 2023-10-20 DIAGNOSIS — Z79899 Other long term (current) drug therapy: Secondary | ICD-10-CM | POA: Insufficient documentation

## 2023-10-20 DIAGNOSIS — Q249 Congenital malformation of heart, unspecified: Secondary | ICD-10-CM | POA: Insufficient documentation

## 2023-10-20 DIAGNOSIS — I249 Acute ischemic heart disease, unspecified: Secondary | ICD-10-CM | POA: Diagnosis not present

## 2023-10-20 DIAGNOSIS — R7889 Finding of other specified substances, not normally found in blood: Secondary | ICD-10-CM | POA: Diagnosis not present

## 2023-10-20 DIAGNOSIS — E039 Hypothyroidism, unspecified: Secondary | ICD-10-CM | POA: Insufficient documentation

## 2023-10-20 DIAGNOSIS — R7989 Other specified abnormal findings of blood chemistry: Secondary | ICD-10-CM | POA: Diagnosis not present

## 2023-10-20 DIAGNOSIS — Z1152 Encounter for screening for COVID-19: Secondary | ICD-10-CM | POA: Insufficient documentation

## 2023-10-20 DIAGNOSIS — E785 Hyperlipidemia, unspecified: Secondary | ICD-10-CM | POA: Insufficient documentation

## 2023-10-20 LAB — CBC WITH DIFFERENTIAL/PLATELET
Abs Immature Granulocytes: 0.06 10*3/uL (ref 0.00–0.07)
Basophils Absolute: 0 10*3/uL (ref 0.0–0.1)
Basophils Relative: 0 %
Eosinophils Absolute: 0.1 10*3/uL (ref 0.0–0.5)
Eosinophils Relative: 2 %
HCT: 43.2 % (ref 39.0–52.0)
Hemoglobin: 14.7 g/dL (ref 13.0–17.0)
Immature Granulocytes: 1 %
Lymphocytes Relative: 22 %
Lymphs Abs: 1.9 10*3/uL (ref 0.7–4.0)
MCH: 30.1 pg (ref 26.0–34.0)
MCHC: 34 g/dL (ref 30.0–36.0)
MCV: 88.3 fL (ref 80.0–100.0)
Monocytes Absolute: 0.9 10*3/uL (ref 0.1–1.0)
Monocytes Relative: 11 %
Neutro Abs: 5.8 10*3/uL (ref 1.7–7.7)
Neutrophils Relative %: 64 %
Platelets: 229 10*3/uL (ref 150–400)
RBC: 4.89 MIL/uL (ref 4.22–5.81)
RDW: 15.6 % — ABNORMAL HIGH (ref 11.5–15.5)
WBC: 8.9 10*3/uL (ref 4.0–10.5)
nRBC: 0 % (ref 0.0–0.2)

## 2023-10-20 LAB — COMPREHENSIVE METABOLIC PANEL
ALT: 25 U/L (ref 0–44)
AST: 31 U/L (ref 15–41)
Albumin: 3.5 g/dL (ref 3.5–5.0)
Alkaline Phosphatase: 92 U/L (ref 38–126)
Anion gap: 14 (ref 5–15)
BUN: 12 mg/dL (ref 8–23)
CO2: 24 mmol/L (ref 22–32)
Calcium: 9.2 mg/dL (ref 8.9–10.3)
Chloride: 100 mmol/L (ref 98–111)
Creatinine, Ser: 0.91 mg/dL (ref 0.61–1.24)
GFR, Estimated: >60 mL/min (ref >60)
Glucose, Bld: 117 mg/dL — ABNORMAL HIGH (ref 70–99)
Potassium: 3.8 mmol/L (ref 3.5–5.1)
Sodium: 138 mmol/L (ref 135–145)
Total Bilirubin: 0.6 mg/dL (ref 0.0–1.2)
Total Protein: 6.7 g/dL (ref 6.5–8.1)

## 2023-10-20 LAB — HIV ANTIBODY (ROUTINE TESTING W REFLEX): HIV Screen 4th Generation wRfx: NONREACTIVE

## 2023-10-20 LAB — TROPONIN I (HIGH SENSITIVITY)
Troponin I (High Sensitivity): 135 ng/L (ref <18)
Troponin I (High Sensitivity): 156 ng/L (ref <18)
Troponin I (High Sensitivity): 111 ng/L (ref <18)
Troponin I (High Sensitivity): 108 ng/L (ref <18)

## 2023-10-20 LAB — MRSA NEXT GEN BY PCR, NASAL: MRSA by PCR Next Gen: NOT DETECTED

## 2023-10-20 LAB — TSH: TSH: 4.689 u[IU]/mL — ABNORMAL HIGH (ref 0.350–4.500)

## 2023-10-20 LAB — RESP PANEL BY RT-PCR (RSV, FLU A&B, COVID)  RVPGX2
Influenza A by PCR: POSITIVE — AB
Influenza B by PCR: NEGATIVE
Resp Syncytial Virus by PCR: NEGATIVE
SARS Coronavirus 2 by RT PCR: NEGATIVE

## 2023-10-20 LAB — T4, FREE: Free T4: 1.02 ng/dL (ref 0.61–1.12)

## 2023-10-20 MED ORDER — POTASSIUM CHLORIDE CRYS ER 10 MEQ PO TBCR
20.0000 meq | EXTENDED_RELEASE_TABLET | Freq: Every day | ORAL | Status: DC
  Filled 2023-10-20: qty 2

## 2023-10-20 MED ORDER — APIXABAN 5 MG PO TABS
5.0000 mg | ORAL_TABLET | Freq: Two times a day (BID) | ORAL | Status: DC
  Administered 2023-10-20: 5 mg via ORAL
  Filled 2023-10-20: qty 1

## 2023-10-20 MED ORDER — ATORVASTATIN CALCIUM 40 MG PO TABS
40.0000 mg | ORAL_TABLET | Freq: Every day | ORAL | Status: DC
  Administered 2023-10-21: 40 mg via ORAL
  Filled 2023-10-20: qty 1

## 2023-10-20 MED ORDER — NAPROXEN 250 MG PO TABS: 250.0000 mg | ORAL_TABLET | Freq: Two times a day (BID) | ORAL | Status: DC | PRN

## 2023-10-20 MED ORDER — FUROSEMIDE 40 MG PO TABS
40.0000 mg | ORAL_TABLET | Freq: Every day | ORAL | Status: DC
  Administered 2023-10-21: 40 mg via ORAL
  Filled 2023-10-20: qty 1

## 2023-10-20 MED ORDER — PANTOPRAZOLE SODIUM 20 MG PO TBEC
20.0000 mg | DELAYED_RELEASE_TABLET | Freq: Every day | ORAL | Status: DC
  Administered 2023-10-21: 20 mg via ORAL
  Filled 2023-10-20: qty 1

## 2023-10-20 MED ORDER — ALBUTEROL SULFATE (2.5 MG/3ML) 0.083% IN NEBU
2.5000 mg | INHALATION_SOLUTION | RESPIRATORY_TRACT | Status: DC | PRN
  Administered 2023-10-20 – 2023-10-21 (×2): 2.5 mg via RESPIRATORY_TRACT
  Filled 2023-10-20 (×2): qty 3

## 2023-10-20 MED ORDER — BENZONATATE 100 MG PO CAPS
100.0000 mg | ORAL_CAPSULE | Freq: Three times a day (TID) | ORAL | Status: DC | PRN
  Administered 2023-10-20: 100 mg via ORAL
  Filled 2023-10-20: qty 1

## 2023-10-20 MED ORDER — METOPROLOL SUCCINATE ER 100 MG PO TB24
100.0000 mg | ORAL_TABLET | Freq: Every day | ORAL | Status: DC
  Administered 2023-10-21: 100 mg via ORAL
  Filled 2023-10-20: qty 1

## 2023-10-20 MED ORDER — OMEPRAZOLE MAGNESIUM 20 MG PO TBEC: 20.0000 mg | DELAYED_RELEASE_TABLET | Freq: Every day | ORAL | Status: DC

## 2023-10-20 MED ORDER — HEPARIN (PORCINE) 25000 UT/250ML-% IV SOLN
1300.0000 [IU]/h | INTRAVENOUS | Status: DC
  Administered 2023-10-21: 1400 [IU]/h via INTRAVENOUS
  Filled 2023-10-20: qty 250

## 2023-10-20 MED ORDER — ASPIRIN 325 MG PO TBEC: 325.0000 mg | DELAYED_RELEASE_TABLET | Freq: Every day | ORAL | Status: DC

## 2023-10-20 NOTE — ED Provider Notes (Signed)
New onset Afib- symptomatic- EMS notified for transport to ED.    Tomi Bamberger, PA-C 10/20/23 [phone]

## 2023-10-20 NOTE — ED Provider Triage Note (Signed)
 Emergency Medicine Provider Triage Evaluation Note  Ryan [name] , a 67 y.o. male  was evaluated in triage.  Pt complains of cough, congestion, urgent care performed ECG and concerned about ?atrial fibrillation.  Has had cough/congestion since Friday night. Has chronic dyspnea on exertion, not when swimming but when on land--after swimming Friday felt some chest tightness like he was getting sick and then later that night felt increasing cough and congestion.  Has not checked his temperature at home.  Has not checked his temperature at home.  Denies chest pain with the exception of some chest tightness that he attributes to having a chest cold.  He reports he is going to the urgent care expecting to get a Z-Pak and was surprised and they recommended he come to the emergency department.  Review of Systems  Positive: Cough, congestion, chronic dyspnea, chest tightness Negative: Known fever  Physical Exam  BP (!) 135/112   Pulse 91   Temp 98.6 F (37 C)   Resp 17   Ht 5' 10 (1.778 m)   Wt (!) 167.8 kg   SpO2 97%   BMI 53.09 kg/m  Gen:   Awake, no distress   Resp:  Normal effort  MSK:   Moves extremities without difficulty    Medical Decision Making  Medically screening exam initiated at 9:58 AM.  Appropriate orders placed.  [name] was informed that the remainder of the evaluation will be completed by another provider, this initial triage assessment does not replace that evaluation, and the importance of remaining in the ED until their evaluation is complete.  ECG at urgent care reviewed--there are some PW and artifact, not clearly afib. Will repeat, order labs in setting of cough/congestion/chest tightness and TSH if concern for new afib.   [name], MD 10/20/23 1001

## 2023-10-20 NOTE — Plan of Care (Addendum)
 FMTS Interim Progress Note  S: Feeling much better.  No racing heartbeat.  Still hearing himself wheeze, though overall breathing okay.  Also still has a nagging cough.  O: BP (!) 160/109 (BP Location: Right Arm)   Pulse 88   Temp 99.2 F (37.3 C) (Oral)   Resp 17   Ht 5' 10 (1.778 m)   Wt (!) 176.7 kg   SpO2 94%   BMI 55.89 kg/m   General: Alert and oriented, in NAD HEENT: NCAT, EOM grossly normal, midline nasal septum Cardiac: RRR, no m/r/g appreciated Respiratory: Diffuse wheezes, intermittent coughing, breathing and speaking comfortably on RA Extremities: Moves all extremities grossly equally Neurological: No gross focal deficit Psychiatric: Appropriate mood and affect  A/P: New onset atrial fibrillation, elevated troponins Stable, rate controlled on metoprolol  100 mg daily.  In setting of influenza. Had 1 dose of Eliquis  in ED.  Heparin  infusion set to start at 2 AM.  Elevated troponins likely due to demand ischemia; reassuringly have peaked and trending down.  Cardiology is following.  HTN Uncontrolled on home metoprolol  100 mg daily and furosemide  40 mg daily.  Likely in setting of recent stressor.  Reassured by exam.  Consider addition of ARB if persistently elevated.  Influenza Diffuse wheezing on exam as well as cough.  Outside of timeline for Tamiflu.  Given bothersome nature, will order albuterol  nebs 2.5 mg Q4H prn and Tessalon  Perles.  Remainder per day team note.  Tharon Lung, MD 10/20/2023, 9:33 PM PGY-2, [facility] Family Medicine Service pager [phone]

## 2023-10-20 NOTE — ED Notes (Signed)
EKG shown to NP Amy Andria Meuse and recommended hospital eval; called EMS to transport

## 2023-10-20 NOTE — ED Provider Notes (Signed)
 [name] AT [facility] Provider Note   CSN: [id number] Arrival date & time: 10/20/23  0944     History  Chief Complaint  Patient presents with   Atrial Fibrillation    Ryan [name] is a 67 y.o. male.   Atrial Fibrillation  This patient is a very pleasant 67 year old male with a history of hypertension on metoprolol  and losartan , he also has a history of high cholesterol on Lipitor.  He takes occasional aspirin .  He presents to the hospital today with a complaint of some coughing and shortness of breath with mild upper respiratory symptoms which started 5 days ago, he started to notice on a day when he went to the gym that he was a little bit more short of breath and started to have some coughing.  He would had a little bit of pressure in his chest, then today started having some upper respiratory symptoms with nasal drainage sore throat and stuffy head.  When he went to the urgent care he was found to be in new onset atrial fibrillation with a controlled heart rate between 70 and 90 bpm.  The patient has no palpitations, he has never had atrial fibrillation and in fact states he has never had any heart problems at all.  He used to be severely obese weighing over 450 pounds but is currently down to 370 pounds, he has been consistently losing weight and doing well with that.  He does not drink alcohol, he does not smoke cigarettes anymore, he did smoke for 30 years in his past.  He denies any prior lung history [name] has not been taking any other medications except for some over-the-counter cough and cold medications.     Home Medications Prior to Admission medications   Medication Sig Start Date End Date Taking? Authorizing Provider  aspirin  325 MG EC tablet Take 325 mg by mouth daily.   Yes [provider]  atorvastatin  (LIPITOR) 40 MG tablet Take 1 tablet (40 mg total) by mouth daily. 02/23/18  Yes Christi Glendia HERO, MD  Collagen-Vitamin C-Biotin  (COLLAGEN PO) Take 1 capsule by mouth daily.   Yes [provider]  furosemide  (LASIX ) 40 MG tablet TAKE 1 TABLET DAILY 04/23/18  Yes Nadel, Ayson M, MD  metoprolol  succinate (TOPROL -XL) 100 MG 24 hr tablet TAKE 1 TABLET DAILY 04/26/18  Yes Nadel, Autrey M, MD  Multiple Vitamins-Minerals (ONE-A-DAY MENS 50+) TABS Take 1 tablet by mouth daily.   Yes [provider]  naproxen  sodium (ALEVE ) 220 MG tablet Take 220 mg by mouth 2 (two) times daily as needed (pain).   Yes [provider]  omeprazole  (PRILOSEC  OTC) 20 MG tablet Take 20 mg by mouth daily. 30 minutes before a meal AS NEEDED   Yes [provider]  Potassium Chloride  ER 20 MEQ TBCR Take 20 mEq by mouth daily.   Yes [provider]  traMADol  (ULTRAM ) 50 MG tablet Take 100 mg by mouth every 12 (twelve) hours as needed for moderate pain (pain score 4-6). 09/30/23  Yes [provider]  VITAMIN D PO Take 1 tablet by mouth daily.   Yes [provider]      Allergies    Patient has no known allergies.    Review of Systems   Review of Systems  All other systems reviewed and are negative.   Physical Exam Updated Vital Signs BP (!) 147/96   Pulse 73   Temp 98.3 F (36.8 C) (Oral)  Resp 14   Ht 1.778 m (5' 10)   Wt (!) 167.8 kg   SpO2 96%   BMI 53.09 kg/m  Physical Exam Vitals and nursing note reviewed.  Constitutional:      General: He is not in acute distress.    Appearance: He is well-developed.  HENT:     Head: Normocephalic and atraumatic.     Mouth/Throat:     Pharynx: No oropharyngeal exudate.  Eyes:     General: No scleral icterus.       Right eye: No discharge.        Left eye: No discharge.     Conjunctiva/sclera: Conjunctivae normal.     Pupils: Pupils are equal, round, and reactive to light.  Neck:     Thyroid : No thyromegaly.     Vascular: No JVD.  Cardiovascular:     Rate and Rhythm: Normal rate. Rhythm irregular.     Heart sounds: Normal heart  sounds. No murmur heard.    No friction rub. No gallop.     Comments: The heart is irregularly irregular going approximately 90 bpm with some variation, normal pulses, no edema, no JVD Pulmonary:     Effort: Pulmonary effort is normal. No respiratory distress.     Breath sounds: Normal breath sounds. No wheezing or rales.  Abdominal:     General: Bowel sounds are normal. There is no distension.     Palpations: Abdomen is soft. There is no mass.     Tenderness: There is no abdominal tenderness.  Musculoskeletal:        General: No tenderness. Normal range of motion.     Cervical back: Normal range of motion and neck supple.  Lymphadenopathy:     Cervical: No cervical adenopathy.  Skin:    General: Skin is warm and dry.     Findings: No erythema or rash.  Neurological:     Mental Status: He is alert.     Coordination: Coordination normal.  Psychiatric:        Behavior: Behavior normal.     ED Results / Procedures / Treatments   Labs (all labs ordered are listed, but only abnormal results are displayed) Labs Reviewed  RESP PANEL BY RT-PCR (RSV, FLU A&B, COVID)  RVPGX2 - Abnormal; Notable for the following components:      Result Value   Influenza A by PCR POSITIVE (*)    All other components within normal limits  CBC WITH DIFFERENTIAL/PLATELET - Abnormal; Notable for the following components:   RDW 15.6 (*)    All other components within normal limits  COMPREHENSIVE METABOLIC PANEL - Abnormal; Notable for the following components:   Glucose, Bld 117 (*)    All other components within normal limits  TSH - Abnormal; Notable for the following components:   TSH 4.689 (*)    All other components within normal limits  TROPONIN I (HIGH SENSITIVITY) - Abnormal; Notable for the following components:   Troponin I (High Sensitivity) 135 (*)    All other components within normal limits  TROPONIN I (HIGH SENSITIVITY) - Abnormal; Notable for the following components:   Troponin I (High  Sensitivity) 156 (*)    All other components within normal limits  T4, FREE  APTT    EKG EKG Interpretation Date/Time:  Tuesday October 20 2023 10:00:52 EST Ventricular Rate:  79 PR Interval:    QRS Duration:  132 QT Interval:  418 QTC Calculation: 479 R Axis:   80  Text Interpretation:  Atrial fibrillation Right bundle branch block Abnormal ECG When compared with ECG of 20-Oct-2023 08:42, PREVIOUS ECG IS PRESENT Confirmed by Cleotilde Rogue ([id number]) on 10/20/2023 12:40:12 PM  Radiology DG Chest 2 View Result Date: 10/20/2023 CLINICAL DATA:  Shortness of breath, chest tightness EXAM: CHEST - 2 VIEW COMPARISON:  12/31/2016 FINDINGS: Linear scarring or atelectasis laterally at the left lung base as before. No new infiltrate or overt edema. Heart size and mediastinal contours are within normal limits. No effusion. Visualized bones unremarkable. IMPRESSION: No acute cardiopulmonary disease. Electronically Signed   By: JONETTA Faes M.D.   On: 10/20/2023 12:22    Procedures .Critical Care  Performed by: Cleotilde Rogue, MD Authorized by: Cleotilde Rogue, MD   Critical care provider statement:    Critical care time (minutes):  45   Critical care time was exclusive of:  Separately billable procedures and treating other patients and teaching time   Critical care was necessary to treat or prevent imminent or life-threatening deterioration of the following conditions:  Cardiac failure   Critical care was time spent personally by me on the following activities:  Development of treatment plan with patient or surrogate, discussions with consultants, evaluation of patient's response to treatment, examination of patient, obtaining history from patient or surrogate, review of old charts, re-evaluation of patient's condition, pulse oximetry, ordering and review of radiographic studies, ordering and review of laboratory studies and ordering and performing treatments and interventions   I assumed direction of  critical care for this patient from another provider in my specialty: no     Care discussed with: admitting provider   Comments:           Medications Ordered in ED Medications  heparin  ADULT infusion 100 units/mL (25000 units/250mL) (has no administration in time range)    ED Course/ Medical Decision Making/ A&P                                 Medical Decision Making Amount and/or Complexity of Data Reviewed Labs: ordered. Radiology: ordered.  Risk Prescription drug management. Decision regarding hospitalization.    This patient presents to the ED for concern of upper respiratory illness with new onset atrial fibrillation, this involves an extensive number of treatment options, and is a complaint that carries with it a high risk of complications and morbidity.  The differential diagnosis includes new onset A-fib, could be related to medications he is using, some stress from the influenza that he tested positive for, there does not appear to be any pneumonia clinically, would also consider that he has a primary ischemia given that he has had some heaviness on his chest.   Co morbidities that complicate the patient evaluation  Morbidly obese, hypertension   Additional history obtained:  Additional history obtained from medical record External records from outside source obtained and reviewed including urgent care notes, the patient had an admission to the hospital in 2019 because of some slurred speech and blurred vision.  Echocardiogram performed in 2018 showed an ejection fraction of 55 to 60%, there was some grade 1 diastolic dysfunction Last saw the cardiology office in 2019 for hypertension, was working with the pharmacist at that time   Lab Tests:  I Ordered, and personally interpreted labs.  The pertinent results include: Initial troponin elevated at 135, metabolic panel and CBC otherwise unremarkable   Imaging Studies ordered:  I ordered imaging studies  including chest x-ray I  independently visualized and interpreted imaging which showed no acute findings I agree with the radiologist interpretation   Cardiac Monitoring: / EKG:  The patient was maintained on a cardiac monitor.  I personally viewed and interpreted the cardiac monitored which showed an underlying rhythm of: Atrial fibrillation, rate controlled   Consultations Obtained:  I requested consultation with the cardiology team as well as the hospitalist service through the family practice,  and discussed lab and imaging findings as well as pertinent plan - they recommend: Admit, they will make recommendations on heparin  drip as well   Problem List / ED Course / Critical interventions / Medication management   CHA2DS2-VASc Score =    The patient's score is based upon:        ASSESSMENT AND PLAN: Paroxysmal Atrial Fibrillation (ICD10:  I48.0) The patient's CHA2DS2-VASc score is  , indicating a  % annual risk of stroke.  2 - age, and hypertension      Signed,  Redell JONETTA Pinal, MD    10/20/2023 3:17 PM     Influenza positive but outside of the window for treatment, x-ray negative for pneumonia, no hypoxia or tachycardia I ordered medication including Eliquis  for home Reevaluation of the patient after these medicines showed that the patient still having chest heaviness I have reviewed the patients home medicines and have made adjustments as needed   Social Determinants of Health:  Morbidly obese   Test / Admission - Considered:  Admit         Final Clinical Impression(s) / ED Diagnoses Final diagnoses:  Atrial fibrillation, unspecified type (HCC)  Elevated troponin  NSTEMI (non-ST elevated myocardial infarction) [facility])    Rx / DC Orders ED Discharge Orders          Ordered    Amb referral to AFIB Clinic        10/20/23 1316              Pinal Redell, MD 10/20/23 1517

## 2023-10-20 NOTE — H&P (Addendum)
 Hospital Admission History and Physical Service Pager: [phone]  Patient name: Ryan [name] Medical record number: [id number] Date of Birth: 1956-04-29 Age: 67 y.o. Gender: male  Primary Care Provider: Christi Glendia HERO, MD Consultants: Cardiology Code Status: Full Code   Preferred Emergency Contact:  Contact Information     Name Relation Home Work [place] Daughter   [phone]   Mathayus, Stanbery Daughter   [phone]   HALL,VICKI Significant other [phone]  [phone]      Other Contacts   None on File     Chief Complaint: New onset Afib  Assessment and Plan: Ryan [name] is a 67 y.o. male presenting with new onset afib. Differential for presentation of this includes stress from URI, ischemia, hyperthyroidism, other structural abnormality of the heart.   URI- patient is positive for flu A, and has no prior cardiac history. He is otherwise asymptomatic at this time Ischemia- trops are elevated to 156, but without true chest pain. Hyperthyroidism- patient has normal t4, and mildly elevated TSH, so unlikely. Structural abnormality of the heart- no prior history, though did mention a possible murmur which resolved.  Assessment & Plan New onset a-fib [facility]) Patient will be admitted for work up due to elevated troponins. Cardiology is consulted and following.  -admit to FMTS, Dr. Donzetta attending -cardiology following, appreciate recommendations -progressive care -rate controlled on home Metoprolol  100mg  -IV heparin  for short term anticoagulation -Echocardiogram -outpatient PET stress test -telemetry -vitals per floor -trend troponins to peak  Chronic and Stable Problems:  HTN: continue home metoprolol  and lasix  40 mg HLD: continue home atorvastatin  40 mg  FEN/GI: Heart healthy VTE Prophylaxis: heparin   Disposition: progressive  History of Present Illness:  Ryan [name] is a 67 y.o. male presenting with new onset Afib,   Went swimming  on Friday and got in the sauna, and felt like he might be sick, as his chest got tight. On the way home he developed a cough and by Saturday he had a full cold going on. Sunday he was feeling bad, and felt like he had a head cold, and decided to go to Urgent Care today and was found to have A. Fib. Then took the ambulance to [facility].   Note's hx of valve issue with his heart, noted on echo in November, but then that cleared up.   In the ED, he tested positve for flu A and was found to still be in rate controlled Afib. EKG showed new RBBB. Troponins were elevated to 135 >> 156.   Review Of Systems: Per HPI   Pertinent Past Medical History: HTN HLD Hx TIA  Remainder reviewed in history tab.   Pertinent Past Surgical History: Orthoscopic Knee surgery 10 and 15 years ago Tonsillectomy in childhood  Remainder reviewed in history tab.  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 Fib w/ pacemaker Father - CHF   Remainder reviewed in history tab.   Important Outpatient Medications: Metoprolol  succinate 100 mg Atorvastatin  40 mg ASA 81 Omeprazole  20 mg Tramadol  20 mg prn Furosemide  40 mg daily Potassium 20 meq Aleve   Remainder reviewed in medication history.   Objective: BP (!) 147/96   Pulse 73   Temp 98.3 F (36.8 C) (Oral)   Resp 14   Ht 5' 10 (1.778 m)  Wt (!) 167.8 kg   SpO2 96%   BMI 53.09 kg/m  Exam: General: Well appearing male, no distress Eyes: PERRLA EOMI Cardiovascular: Irregularly irregular, no m/r/g Respiratory: CTAB, no increased WOB. Some upper airway congestion and cough.  Gastrointestinal: Flat, soft, nontender MSK: trace pitting edema in the bilateral LE  Labs:  CBC BMET  Recent Labs  Lab 10/20/23 1006  WBC 8.9  HGB 14.7  HCT 43.2  PLT 229   Recent Labs  Lab  10/20/23 1006  NA 138  K 3.8  CL 100  CO2 24  BUN 12  CREATININE 0.91  GLUCOSE 117*  CALCIUM  9.2    Troponin- 135, 156  EKG: Afib, rate controlled, positive axis, RBBB, better visualized on 08:42 EKG in leads V2 and V3    Imaging Studies Performed: DG Chest 2 View  No evidence of cardiopulmonary disease.   Cleotilde Lukes, DO 10/20/2023, 2:52 PM PGY-1,  Family Medicine  FPTS Intern pager: [phone], text pages welcome Secure chat group [facility] Teaching Service   Upper Level Addendum: I have seen and evaluated this patient along with Dr. Cleotilde and reviewed the above note, making necessary revisions as appropriate. I agree with the medical decision making and physical exam as noted above. Penne Rhein, MD PGY-3 [facility] Family Medicine Residency

## 2023-10-20 NOTE — ED Triage Notes (Signed)
"  Woke up Friday after gym with chest pain, since then a lot of sob, wheezing, cough and congestion". Home COVID/Flu (Negative). Chest pain and SOB/Wheezing remains, No fever.

## 2023-10-20 NOTE — Hospital Course (Addendum)
 Ryan [name] is a 67 y.o. male who was admitted to the [facility] Medicine Teaching Service at [facility] for new onset Afib with chest pain and elevated troponins. Hospital course is outlined below by problem.   New onset Atrial Fibrillation Patient adequately rate controlled on home metoprolol . No prior history of CAD or CHF. Troponin peaked at 156. Heparin  drip was started for anticoagulation. Echocardiogram was obtained and showed LVEF 60-65% though study was limited by body habitus. Prior to discharge he was transitioned to Eliquis  5mg  twice daily for anticoagulation.   Hypertension Patient initially with uncontrolled HTN. Continued on home metoprolol  with addition of losartan  50mg  daily. Pressures did normalize somewhat to 140s over 80s by time of discharge. Patient remained asymptomatic throughout admission.  Influenza A Found to be flu+ on admission but outside of the window for Tamiflu so this was not started. Supportive care was provided as indicated but patient remained largely asymptomatic and was discharged home in stable condition.  Other conditions that were chronic and stable were managed with home medications or formulary alternatives.   Issues for follow up: Outpatient PET stress test  Repeat TSH outpatient Follow up BMP after starting losartan  Continue Eliquis  5 mg twice daily and follow-up with cardiology

## 2023-10-20 NOTE — ED Notes (Signed)
Updated. Up to b/r, steady gait. Denies sx. VSS.

## 2023-10-20 NOTE — Consult Note (Signed)
 Cardiology Consultation   Patient ID: EXCELL NEYLAND MRN: [id number]; DOB: [birth year]  Admit date: 10/20/2023 Date of Consult: 10/20/2023  PCP:  Christi Glendia HERO, MD   [facility] Providers Cardiologist:  New - Dr. Elmira, remotely seen by Dr. Jeffrie in 2019    Patient Profile:   Ryan [name] is a 67 y.o. male with a hx of hypertension, dilated aortic root, fatty liver disease and OSA not on CPAP who is being seen 10/20/2023 for the evaluation of atrial fibrillation at the request of Dr. Donzetta.  History of Present Illness:   Mr. Ryan [name] is a morbidly obese 67 year old male with past medical history of hypertension, dilated aortic root, fatty liver disease and OSA not on CPAP.  Patient was previously seen by Dr. Jeffrie in June 2019.  He previously declined bariatric surgery.  He takes metoprolol  succinate 100 mg daily at home.  He sleeps on his stomach therefore never wear a CPAP.  Patient was in his usual state of health until last Friday when he started having shortness of breath and chest congestion with cough.  He denies any recent exertional chest pain but admits he is not very active.  He eventually sought medical attention at a local urgent care, he was found to be in rate controlled atrial fibrillation with underlying right bundle branch block which is new when compared to the previous EKG from 2019.  He was admitted by internal medicine service.  Cardiology service consulted for newly diagnosed atrial fibrillation. Serial troponin 135--156.  The manage CBC were normal.  TSH borderline high, normal free T4.  Flu a positive.  Chest x-ray showed no acute finding.  EKG showed rate controlled atrial fibrillation.  Cardiology service consulted for A-fib.   Past Medical History:  Diagnosis Date   Anxiety    Arthritis    knees (03/05/2018)   Cigarette smoker    Diastolic dysfunction    Diverticulosis of colon    DJD (degenerative joint disease)    Fatty liver disease,  nonalcoholic    Hemorrhoids    Hx of colonic polyps    Hypercholesteremia    Hypertension    Obesity    Seborrheic dermatitis    Slurred speech    Stenosis of rectum and anus    TIA (transient ischemic attack) 03/05/2018   Venous insufficiency     Past Surgical History:  Procedure Laterality Date   COLONOSCOPY W/ POLYPECTOMY  06/27/2009   FLEXIBLE SIGMOIDOSCOPY     KNEE ARTHROSCOPY Right 11/2003   Dr. Heide   KNEE ARTHROSCOPY Left 05/24/2012   torn meniscus    TONSILLECTOMY       Home Medications:  Prior to Admission medications   Medication Sig Start Date End Date Taking? Authorizing Provider  aspirin  325 MG EC tablet Take 325 mg by mouth daily.   Yes [provider]  atorvastatin  (LIPITOR) 40 MG tablet Take 1 tablet (40 mg total) by mouth daily. 02/23/18  Yes Christi Glendia HERO, MD  Collagen-Vitamin C-Biotin (COLLAGEN PO) Take 1 capsule by mouth daily.   Yes [provider]  furosemide  (LASIX ) 40 MG tablet TAKE 1 TABLET DAILY 04/23/18  Yes Nadel, Ernan M, MD  metoprolol  succinate (TOPROL -XL) 100 MG 24 hr tablet TAKE 1 TABLET DAILY 04/26/18  Yes Nadel, Avishai M, MD  Multiple Vitamins-Minerals (ONE-A-DAY MENS 50+) TABS Take 1 tablet by mouth daily.   Yes [provider]  naproxen  sodium (ALEVE ) 220 MG tablet Take 220 mg by mouth  2 (two) times daily as needed (pain).   Yes [provider]  omeprazole  (PRILOSEC  OTC) 20 MG tablet Take 20 mg by mouth daily. 30 minutes before a meal AS NEEDED   Yes [provider]  Potassium Chloride  ER 20 MEQ TBCR Take 20 mEq by mouth daily.   Yes [provider]  traMADol  (ULTRAM ) 50 MG tablet Take 100 mg by mouth every 12 (twelve) hours as needed for moderate pain (pain score 4-6). 09/30/23  Yes [provider]  VITAMIN D PO Take 1 tablet by mouth daily.   Yes [provider]    Inpatient Medications: Scheduled Meds:  [START ON 10/21/2023] aspirin  EC  325 mg Oral Daily   [START ON  10/21/2023] atorvastatin   40 mg Oral Daily   [START ON 10/21/2023] furosemide   40 mg Oral Daily   [START ON 10/21/2023] metoprolol  succinate  100 mg Oral Daily   [START ON 10/21/2023] pantoprazole   20 mg Oral Daily   [START ON 10/21/2023] potassium chloride   20 mEq Oral Daily   Continuous Infusions:  [START ON 10/21/2023] heparin      PRN Meds: naproxen   Allergies:   No Known Allergies  Social History:   Social History   Socioeconomic History   Marital status: Widowed    Spouse name: Not on file   Number of children: 2   Years of education: Not on file   Highest education level: Not on file  Occupational History   Not on file  Tobacco Use   Smoking status: Former    Current packs/day: 0.00    Average packs/day: 1 pack/day for 27.0 years (27.0 ttl pk-yrs)    Types: Cigarettes    Start date: 10/20/1982    Quit date: 10/20/2009    Years since quitting: 14.0   Smokeless tobacco: Never  Vaping Use   Vaping status: Never Used  Substance and Sexual Activity   Alcohol use: Yes    Alcohol/week: 8.0 standard drinks of alcohol    Types: 8 Cans of beer per week   Drug use: Never   Sexual activity: Not Currently  Other Topics Concern   Not on file  Social History Narrative   Not on file   Social Drivers of Health   Financial Resource Strain: Not on file  Food Insecurity: Not on file  Transportation Needs: Not on file  Physical Activity: Not on file  Stress: Not on file  Social Connections: Not on file  Intimate Partner Violence: Not on file    Family History:    Family History  Problem Relation Age of Onset   Hypertension Father    Hypertension Mother      ROS:  Please see the history of present illness.   All other ROS reviewed and negative.     Physical Exam/Data:   Vitals:   10/20/23 1405 10/20/23 1407 10/20/23 1415 10/20/23 1430  BP:   (!) 147/106 (!) 147/96  Pulse: 76  69 73  Resp: (!) 23  13 14   Temp:  98.3 F (36.8 C)    TempSrc:  Oral    SpO2: 96%  97% 96%   Weight:      Height:       No intake or output data in the 24 hours ending 10/20/23 1643    10/20/2023    9:47 AM 10/20/2023    8:34 AM 04/01/2018    9:44 AM  Last 3 Weights  Weight (lbs) 370 lb 397 lb 11.4 oz 397 lb  12.8 oz  Weight (kg) 167.831 kg 180.4 kg 180.441 kg     Body mass index is 53.09 kg/m.  General:  Well nourished, well developed, in no acute distress HEENT: normal Neck: no JVD Vascular: No carotid bruits; Distal pulses 2+ bilaterally Cardiac:  normal S1, S2; RRR; no murmur  Lungs:  clear to auscultation bilaterally, no wheezing, rhonchi or rales  Abd: soft, nontender, no hepatomegaly  Ext: no edema Musculoskeletal:  No deformities, BUE and BLE strength normal and equal Skin: warm and dry  Neuro:  CNs 2-12 intact, no focal abnormalities noted Psych:  Normal affect   EKG:  The EKG was personally reviewed and demonstrates: Rate controlled atrial fibrillation with right bundle branch block Telemetry:  Telemetry was personally reviewed and demonstrates: Rate controlled atrial fibrillation, heart rate in the 80s  Relevant CV Studies:  Echo 01/20/2017 LV EF: 55% -   60%  Study Conclusions   - Left ventricle: The cavity size was normal. There was moderate    concentric hypertrophy. Systolic function was normal. The    estimated ejection fraction was in the range of 55% to 60%. Wall    motion was normal; there were no regional wall motion    abnormalities. Doppler parameters are consistent with abnormal    left ventricular relaxation (grade 1 diastolic dysfunction).    Doppler parameters are consistent with elevated ventricular    end-diastolic filling pressure.  - Aortic valve: There was no regurgitation.  - Ascending aorta: The ascending aorta was mildly dilated measuring    42 mm.  - Left atrium: The atrium was moderately dilated.  - Right ventricle: Systolic function was normal.  - Tricuspid valve: There was trivial regurgitation.  - Pulmonic valve: There  was no regurgitation.  - Pulmonary arteries: Systolic pressure was within the normal    range.  - Inferior vena cava: The vessel was normal in size.  - Pericardium, extracardiac: There was no pericardial effusion.   Laboratory Data:  High Sensitivity Troponin:   Recent Labs  Lab 10/20/23 1006 10/20/23 1312  TROPONINIHS 135* 156*     Chemistry Recent Labs  Lab 10/20/23 1006  NA 138  K 3.8  CL 100  CO2 24  GLUCOSE 117*  BUN 12  CREATININE 0.91  CALCIUM  9.2  GFRNONAA >60  ANIONGAP 14    Recent Labs  Lab 10/20/23 1006  PROT 6.7  ALBUMIN 3.5  AST 31  ALT 25  ALKPHOS 92  BILITOT 0.6   Lipids No results for input(s): CHOL, TRIG, HDL, LABVLDL, LDLCALC, CHOLHDL in the last 168 hours.  Hematology Recent Labs  Lab 10/20/23 1006  WBC 8.9  RBC 4.89  HGB 14.7  HCT 43.2  MCV 88.3  MCH 30.1  MCHC 34.0  RDW 15.6*  PLT 229   Thyroid   Recent Labs  Lab 10/20/23 1006  TSH 4.689*  FREET4 1.02    BNPNo results for input(s): BNP, PROBNP in the last 168 hours.  DDimer No results for input(s): DDIMER in the last 168 hours.   Radiology/Studies:  DG Chest 2 View Result Date: 10/20/2023 CLINICAL DATA:  Shortness of breath, chest tightness EXAM: CHEST - 2 VIEW COMPARISON:  12/31/2016 FINDINGS: Linear scarring or atelectasis laterally at the left lung base as before. No new infiltrate or overt edema. Heart size and mediastinal contours are within normal limits. No effusion. Visualized bones unremarkable. IMPRESSION: No acute cardiopulmonary disease. Electronically Signed   By: JONETTA Faes M.D.   On: 10/20/2023 12:22  Assessment and Plan:   Newly diagnosed atrial fibrillation with right bundle branch block  - CHA2DS-Vasc score 2  -Rate controlled on home dose of 100 mg daily of metoprolol  succinate.  Continue IV heparin .  Obtain echocardiogram, as long as EF is normal and there is no wall motion abnormality, consider switch to Eliquis  tomorrow prior to  discharge.  Elevated troponin: Serial troponin 135--> 156.  Denies any recent exertional chest pain.  If echocardiogram is normal tomorrow, consider discharge to have outpatient PET stress test.  Influenza A: Likely responsible for his shortness of breath and a cough.  Hypertension: On metoprolol  succinate 100 mg daily  Dilated aortic root  Morbid obesity: Previously declined bariatric surgery    Risk Assessment/Risk Scores:     TIMI Risk Score for Unstable Angina or Non-ST Elevation MI:   The patient's TIMI risk score is 3, which indicates a 13% risk of all cause mortality, new or recurrent myocardial infarction or need for urgent revascularization in the next 14 days.    CHA2DS2-VASc Score = 2   This indicates a 2.2% annual risk of stroke. The patient's score is based upon: CHF History: 0 HTN History: 1 Diabetes History: 0 Stroke History: 0 Vascular Disease History: 0 Age Score: 1 Gender Score: 0         For questions or updates, please contact [facility] Please consult [email or web address] for contact info under    Signed, Takoda Janowiak, [place]  10/20/2023 4:43 PM

## 2023-10-20 NOTE — ED Notes (Signed)
EDP in to see, at BS.  

## 2023-10-20 NOTE — ED Notes (Signed)
 ED TO INPATIENT HANDOFF REPORT  ED Nurse Name and Phone #: Mardeen RN [phone]  S Name/Age/Gender Ryan [name] 66 y.o. male Room/Bed: 039C/039C  Code Status   Code Status: Full Code  Home/SNF/Other Home Patient oriented to: self, place, time, and situation Is this baseline? Yes   Triage Complete: Triage complete  Chief Complaint Atrial fibrillation [facility]) [I48.91]  Triage Note Pt came for afib. Seen at [facility] today for cough and cold sx. EKG showed new onset afib. VSS. 20 L wrist.    Allergies No Known Allergies  Level of Care/Admitting Diagnosis ED Disposition     ED Disposition  Admit   Condition  --   Comment  Hospital Area: [facility] [[id number]]  Level of Care: Progressive [102]  Admit to Progressive based on following criteria: CARDIOVASCULAR & THORACIC of moderate stability with acute coronary syndrome symptoms/low risk myocardial infarction/hypertensive urgency/arrhythmias/heart failure potentially compromising stability and stable post cardiovascular intervention patients.  May admit patient to [facility] or [facility] if equivalent level of care is available:: No  Covid Evaluation: Confirmed COVID Negative  Diagnosis: Atrial fibrillation (HCC) [427.31.ICD-9-CM]  Admitting Physician: DONZETTA ROLLENE BRAVO [[id number]]  Attending Physician: DONZETTA ROLLENE BRAVO [[id number]]  Certification:: I certify this patient will need inpatient services for at least 2 midnights  Expected Medical Readiness: 10/22/2023          B Medical/Surgery History Past Medical History:  Diagnosis Date   Anxiety    Arthritis    knees (03/05/2018)   Cigarette smoker    Diastolic dysfunction    Diverticulosis of colon    DJD (degenerative joint disease)    Fatty liver disease, nonalcoholic    Hemorrhoids    Hx of colonic polyps    Hypercholesteremia    Hypertension    Obesity    Seborrheic dermatitis    Slurred speech    Stenosis of rectum and anus    TIA (transient  ischemic attack) 03/05/2018   Venous insufficiency    Past Surgical History:  Procedure Laterality Date   COLONOSCOPY W/ POLYPECTOMY  06/27/2009   FLEXIBLE SIGMOIDOSCOPY     KNEE ARTHROSCOPY Right 11/2003   Dr. Heide   KNEE ARTHROSCOPY Left 05/24/2012   torn meniscus    TONSILLECTOMY       A IV Location/Drains/Wounds Patient Lines/Drains/Airways Status     Active Line/Drains/Airways     Name Placement date Placement time Site Days   Peripheral IV 10/20/23 20 G 1 Left;Lateral;Distal Forearm 10/20/23  0944  Forearm  less than 1            Intake/Output Last 24 hours No intake or output data in the 24 hours ending 10/20/23 1851  Labs/Imaging Results for orders placed or performed during the hospital encounter of 10/20/23 (from the past 48 hours)  CBC with Differential     Status: Abnormal   Collection Time: 10/20/23 10:06 AM  Result Value Ref Range   WBC 8.9 4.0 - 10.5 K/uL   RBC 4.89 4.22 - 5.81 MIL/uL   Hemoglobin 14.7 13.0 - 17.0 g/dL   HCT 56.7 60.9 - 47.9 %   MCV 88.3 80.0 - 100.0 fL   MCH 30.1 26.0 - 34.0 pg   MCHC 34.0 30.0 - 36.0 g/dL   RDW 84.3 (H) 88.4 - 84.4 %   Platelets 229 150 - 400 K/uL   nRBC 0.0 0.0 - 0.2 %   Neutrophils Relative % 64 %   Neutro Abs 5.8 1.7 -  7.7 K/uL   Lymphocytes Relative 22 %   Lymphs Abs 1.9 0.7 - 4.0 K/uL   Monocytes Relative 11 %   Monocytes Absolute 0.9 0.1 - 1.0 K/uL   Eosinophils Relative 2 %   Eosinophils Absolute 0.1 0.0 - 0.5 K/uL   Basophils Relative 0 %   Basophils Absolute 0.0 0.0 - 0.1 K/uL   Immature Granulocytes 1 %   Abs Immature Granulocytes 0.06 0.00 - 0.07 K/uL    Comment: Performed at [facility], [address] [place], [place] [address]  Comprehensive metabolic panel     Status: Abnormal   Collection Time: 10/20/23 10:06 AM  Result Value Ref Range   Sodium 138 135 - 145 mmol/L   Potassium 3.8 3.5 - 5.1 mmol/L   Chloride 100 98 - 111 mmol/L   CO2 24 22 - 32 mmol/L   Glucose, Bld 117 (H)  70 - 99 mg/dL    Comment: Glucose reference range applies only to samples taken after fasting for at least 8 hours.   BUN 12 8 - 23 mg/dL   Creatinine, Ser 9.08 0.61 - 1.24 mg/dL   Calcium  9.2 8.9 - 10.3 mg/dL   Total Protein 6.7 6.5 - 8.1 g/dL   Albumin 3.5 3.5 - 5.0 g/dL   AST 31 15 - 41 U/L   ALT 25 0 - 44 U/L   Alkaline Phosphatase 92 38 - 126 U/L   Total Bilirubin 0.6 0.0 - 1.2 mg/dL   GFR, Estimated >39 >39 mL/min    Comment: (NOTE) Calculated using the CKD-EPI Creatinine Equation (2021)    Anion gap 14 5 - 15    Comment: Performed at [facility], [address], [place], [place] [address]  Resp panel by RT-PCR (RSV, Flu A&B, Covid) Anterior Nasal Swab     Status: Abnormal   Collection Time: 10/20/23 10:06 AM   Specimen: Anterior Nasal Swab  Result Value Ref Range   SARS Coronavirus 2 by RT PCR NEGATIVE NEGATIVE   Influenza A by PCR POSITIVE (A) NEGATIVE   Influenza B by PCR NEGATIVE NEGATIVE    Comment: (NOTE) The Xpert Xpress SARS-CoV-2/FLU/RSV plus assay is intended as an aid in the diagnosis of influenza from Nasopharyngeal swab specimens and should not be used as a sole basis for treatment. Nasal washings and aspirates are unacceptable for Xpert Xpress SARS-CoV-2/FLU/RSV testing.  Fact Sheet for Patients: bloggercourse.com  Fact Sheet for Healthcare Providers: seriousbroker.it  This test is not yet approved or cleared by the [place]  FDA and has been authorized for detection and/or diagnosis of SARS-CoV-2 by FDA under an Emergency Use Authorization (EUA). This EUA will remain in effect (meaning this test can be used) for the duration of the COVID-19 declaration under Section 564(b)(1) of the Act, 21 U.S.C. section 360bbb-3(b)(1), unless the authorization is terminated or revoked.     Resp Syncytial Virus by PCR NEGATIVE NEGATIVE    Comment: (NOTE) Fact Sheet for  Patients: bloggercourse.com  Fact Sheet for Healthcare Providers: seriousbroker.it  This test is not yet approved or cleared by the [place]  FDA and has been authorized for detection and/or diagnosis of SARS-CoV-2 by FDA under an Emergency Use Authorization (EUA). This EUA will remain in effect (meaning this test can be used) for the duration of the COVID-19 declaration under Section 564(b)(1) of the Act, 21 U.S.C. section 360bbb-3(b)(1), unless the authorization is terminated or revoked.  Performed at [facility], [address], [place],  [place] [address]   Troponin I (High Sensitivity)     Status: Abnormal   Collection Time: 10/20/23 10:06 AM  Result Value Ref Range   Troponin I (High Sensitivity) 135 (HH) <18 ng/L    Comment: ATTEMPTED TO CALL @ 1142 10/20/23 BY SEKDAHL CRITICAL RESULT CALLED TO, READ BACK BY AND VERIFIED WITH C. BLACK, RN @ 1152 10/20/23 BY SEKDAHL (NOTE) Elevated high sensitivity troponin I (hsTnI) values and significant  changes across serial measurements may suggest ACS but many other  chronic and acute conditions are known to elevate hsTnI results.  Refer to the Links section for chest pain algorithms and additional  guidance. Performed at [facility], [address] [place], [place] [address]   TSH     Status: Abnormal   Collection Time: 10/20/23 10:06 AM  Result Value Ref Range   TSH 4.689 (H) 0.350 - 4.500 uIU/mL    Comment: Performed by a 3rd Generation assay with a functional sensitivity of <=0.01 uIU/mL. Performed at [facility], [address] [place], [place] [address]   T4, free     Status: None   Collection Time: 10/20/23 10:06 AM  Result Value Ref Range   Free T4 1.02 0.61 - 1.12 ng/dL    Comment: (NOTE) Biotin ingestion may interfere with free T4 tests. If the results are inconsistent with the TSH level, previous test results, or the clinical presentation,  then consider biotin interference. If needed, order repeat testing after stopping biotin. Performed at [facility], [address] [place], [place] [address]   Troponin I (High Sensitivity)     Status: Abnormal   Collection Time: 10/20/23  1:12 PM  Result Value Ref Range   Troponin I (High Sensitivity) 156 (HH) <18 ng/L    Comment: CRITICAL VALUE NOTED. VALUE IS CONSISTENT WITH PREVIOUSLY REPORTED/CALLED VALUE (NOTE) Elevated high sensitivity troponin I (hsTnI) values and significant  changes across serial measurements may suggest ACS but many other  chronic and acute conditions are known to elevate hsTnI results.  Refer to the Links section for chest pain algorithms and additional  guidance. Performed at [facility], [address] [place], [place] [address]    DG Chest 2 View Result Date: 10/20/2023 CLINICAL DATA:  Shortness of breath, chest tightness EXAM: CHEST - 2 VIEW COMPARISON:  12/31/2016 FINDINGS: Linear scarring or atelectasis laterally at the left lung base as before. No new infiltrate or overt edema. Heart size and mediastinal contours are within normal limits. No effusion. Visualized bones unremarkable. IMPRESSION: No acute cardiopulmonary disease. Electronically Signed   By: JONETTA Faes M.D.   On: 10/20/2023 12:22    Pending Labs Unresulted Labs (From admission, onward)     Start     Ordered   10/22/23 0500  Heparin  level (unfractionated)  Daily,   R      10/20/23 1450   10/22/23 0500  APTT  Daily,   R      10/20/23 1450   10/21/23 0800  Heparin  level (unfractionated)  Once-Timed,   R        10/20/23 1450   10/21/23 0500  Comprehensive metabolic panel  Tomorrow morning,   R        10/20/23 1619   10/21/23 0200  APTT  Once-Timed,   STAT        10/20/23 1450   10/20/23 1617  HIV Antibody (routine testing w rflx)  (HIV Antibody (Routine testing w reflex) panel)  Once,   R        10/20/23 1619            Vitals/Pain Today's Vitals    10/20/23 1430 10/20/23 1600 10/20/23 1645 10/20/23 1850  BP: (!) 147/96 (!) 135/104 (!) 147/110   Pulse: 73 79 79   Resp: 14 18 16    Temp:    98.2 F (36.8 C)  TempSrc:    Oral  SpO2: 96% 97% 95%   Weight:      Height:      PainSc:        Isolation Precautions No active isolations  Medications Medications  heparin  ADULT infusion 100 units/mL (25000 units/250mL) (has no administration in time range)  aspirin  EC tablet 325 mg (has no administration in time range)  atorvastatin  (LIPITOR) tablet 40 mg (has no administration in time range)  furosemide  (LASIX ) tablet 40 mg (has no administration in time range)  metoprolol  succinate (TOPROL -XL) 24 hr tablet 100 mg (has no administration in time range)  potassium chloride  (KLOR-CON ) CR tablet 20 mEq (has no administration in time range)  pantoprazole  (PROTONIX ) EC tablet 20 mg (has no administration in time range)    Mobility walks     Focused Assessments Cardiac Assessment Handoff:  Cardiac Rhythm: Atrial fibrillation No results found for: CKTOTAL, CKMB, CKMBINDEX, TROPONINI No results found for: DDIMER Does the Patient currently have chest pain? No    R Recommendations: See Admitting Provider Note  Report given to:   Additional Notes:

## 2023-10-20 NOTE — Progress Notes (Signed)
  FMTS Attending Admission Note: Ryan Keeling, MD  Personal pager:  [phone] FPTS Service Pager:  [phone]   I  have personally seen and examined this patient, reviewed their chart and results. I have discussed this patient with the resident. I agree with the resident's findings, assessment and care plan as documented in the resident's note, will sign when available.   Mr Ryan [name] is a 67 yo M with a PMH of HTN, HLD, fatty liver disease, dilated aortic root, obesity, ? TIA, carotid artery disease, OSA on CPAP who presents with 5 d URI symptoms nd presented to urgent care and was found to be in Afib and sent to ED. He denies any dyspnea, chest pain or pressure. He notes some chest pain with coughing fits only. Denies hemoptysis or shortness of breath. Denies leg swelling or orthopnea. Denies palpitations.   In ED on room air, no tachypnea, and hypertensive BP 140-150 SBP/100s DBP.  On Exam, no acute distress. Lungs CTAB. Heart irregular rhythm, regular rate. No murmur. Legs without edema, abd soft + bowel sounds. Occasional cough while in room  Troponin elevated 135 > 156, influenza swab positive. TSH mildly low with normal free T4. CBC and CMP unremarkable.   CXR showed no acute cardiopulmonary disease. EKG showed Afib rate 79, RBBB, no ST elevation, TWI in V3, Qtc 479.   1. New onset Afib, without- CHADS2VASC score of 2. Given Eliquis  x1 in ED. Started on heparin  gtt per cardiology, rate controlled on his home metoprolol .  2. Elevated troponin- EKG without signs of ischemia, will trend until peaked, suspect demand ischemia versus related to influenza. Pending ECHO, continue IV heparin  gtt for now.  3. Influenza infection- out of window for starting Tamiflu, on room air.

## 2023-10-20 NOTE — ED Triage Notes (Signed)
Pt came for afib. Seen at [facility] today for cough and cold sx. EKG showed new onset afib. VSS. 20 L wrist.

## 2023-10-20 NOTE — ED Notes (Signed)
 Patient is being discharged from the Urgent Care and sent to the Emergency Department via EMS . Per AS, patient is in need of higher level of care due to CP/new onset afib. Patient is aware and verbalizes understanding of plan of care.  Vitals:   10/20/23 0832 10/20/23 0836  BP: (!) 161/125 (!) 139/96  Pulse: 94 91  Resp: (!) 40 (!) 38  Temp: 98.5 F (36.9 C)   SpO2: 95% 94%

## 2023-10-20 NOTE — Progress Notes (Signed)
 PHARMACY - ANTICOAGULATION CONSULT NOTE  Pharmacy Consult for heparin  gtt Indication: chest pain/ACS  No Known Allergies  Patient Measurements: Height: 5' 10 (177.8 cm) Weight: (!) 167.8 kg (370 lb) IBW/kg (Calculated) : 73 Heparin  Dosing Weight: 114 kg   Vital Signs: Temp: 98.3 F (36.8 C) (12/31 1407) Temp Source: Oral (12/31 1407) BP: 147/96 (12/31 1430) Pulse Rate: 73 (12/31 1430)  Labs: Recent Labs    10/20/23 1006 10/20/23 1312  HGB 14.7  --   HCT 43.2  --   PLT 229  --   CREATININE 0.91  --   TROPONINIHS 135* 156*    Estimated Creatinine Clearance: 126.9 mL/min (by C-G formula based on SCr of 0.91 mg/dL).   Medical History: Past Medical History:  Diagnosis Date   Anxiety    Arthritis    knees (03/05/2018)   Cigarette smoker    Diastolic dysfunction    Diverticulosis of colon    DJD (degenerative joint disease)    Fatty liver disease, nonalcoholic    Hemorrhoids    Hx of colonic polyps    Hypercholesteremia    Hypertension    Obesity    Seborrheic dermatitis    Slurred speech    Stenosis of rectum and anus    TIA (transient ischemic attack) 03/05/2018   Venous insufficiency    Assessment: 67 yo M with afib plus elevated troponin.   Trop 135>156 . Apixaban  5mg  given at 1405  CBC WNL   Goal of Therapy:  Heparin  level 0.3-0.7 units/ml Monitor platelets by anticoagulation protocol: Yes   Plan:  D/c apixaban   No bolus given recent DOAC Start heparin  infusion 12 hours after last DOAC dose (~0200) Heparin  infusion at 1400 units/hr 8hr HL, aPTT Daily HL, aPTT trend until clinically correlated F/u cardiology plans   Sharyne Glatter, PharmD, BCCCP Clinical Pharmacist 10/20/2023 2:43 PM

## 2023-10-20 NOTE — Assessment & Plan Note (Signed)
 Patient will be admitted for work up due to elevated troponins. Cardiology is consulted and following.  -admit to FMTS, Dr. Donzetta attending -cardiology following, appreciate recommendations -progressive care -rate controlled on home Metoprolol  100mg  -IV heparin  for short term anticoagulation -Echocardiogram -outpatient PET stress test -telemetry -vitals per floor -trend troponins to peak

## 2023-10-21 ENCOUNTER — Inpatient Hospital Stay (HOSPITAL_COMMUNITY): Payer: Medicare Other

## 2023-10-21 ENCOUNTER — Ambulatory Visit: Payer: Self-pay

## 2023-10-21 DIAGNOSIS — Q249 Congenital malformation of heart, unspecified: Secondary | ICD-10-CM | POA: Diagnosis not present

## 2023-10-21 DIAGNOSIS — E785 Hyperlipidemia, unspecified: Secondary | ICD-10-CM | POA: Diagnosis not present

## 2023-10-21 DIAGNOSIS — I10 Essential (primary) hypertension: Secondary | ICD-10-CM | POA: Diagnosis not present

## 2023-10-21 DIAGNOSIS — I4891 Unspecified atrial fibrillation: Secondary | ICD-10-CM

## 2023-10-21 DIAGNOSIS — Z79899 Other long term (current) drug therapy: Secondary | ICD-10-CM | POA: Diagnosis not present

## 2023-10-21 DIAGNOSIS — R7889 Finding of other specified substances, not normally found in blood: Secondary | ICD-10-CM | POA: Diagnosis not present

## 2023-10-21 DIAGNOSIS — Z1152 Encounter for screening for COVID-19: Secondary | ICD-10-CM | POA: Diagnosis not present

## 2023-10-21 DIAGNOSIS — Z7901 Long term (current) use of anticoagulants: Secondary | ICD-10-CM | POA: Diagnosis not present

## 2023-10-21 DIAGNOSIS — J069 Acute upper respiratory infection, unspecified: Secondary | ICD-10-CM | POA: Diagnosis not present

## 2023-10-21 DIAGNOSIS — E039 Hypothyroidism, unspecified: Secondary | ICD-10-CM | POA: Diagnosis not present

## 2023-10-21 DIAGNOSIS — I249 Acute ischemic heart disease, unspecified: Secondary | ICD-10-CM | POA: Diagnosis not present

## 2023-10-21 DIAGNOSIS — I480 Paroxysmal atrial fibrillation: Secondary | ICD-10-CM | POA: Diagnosis not present

## 2023-10-21 DIAGNOSIS — J09X9 Influenza due to identified novel influenza A virus with other manifestations: Secondary | ICD-10-CM | POA: Diagnosis not present

## 2023-10-21 DIAGNOSIS — I214 Non-ST elevation (NSTEMI) myocardial infarction: Secondary | ICD-10-CM | POA: Diagnosis not present

## 2023-10-21 LAB — COMPREHENSIVE METABOLIC PANEL
ALT: 23 U/L (ref 0–44)
AST: 29 U/L (ref 15–41)
Albumin: 3.1 g/dL — ABNORMAL LOW (ref 3.5–5.0)
Alkaline Phosphatase: 79 U/L (ref 38–126)
Anion gap: 12 (ref 5–15)
BUN: 16 mg/dL (ref 8–23)
CO2: 27 mmol/L (ref 22–32)
Calcium: 9.1 mg/dL (ref 8.9–10.3)
Chloride: 101 mmol/L (ref 98–111)
Creatinine, Ser: 0.82 mg/dL (ref 0.61–1.24)
GFR, Estimated: >60 mL/min (ref >60)
Glucose, Bld: 126 mg/dL — ABNORMAL HIGH (ref 70–99)
Potassium: 3.4 mmol/L — ABNORMAL LOW (ref 3.5–5.1)
Sodium: 140 mmol/L (ref 135–145)
Total Bilirubin: 0.5 mg/dL (ref 0.0–1.2)
Total Protein: 6.1 g/dL — ABNORMAL LOW (ref 6.5–8.1)

## 2023-10-21 LAB — APTT: aPTT: 36 s (ref 24–36)

## 2023-10-21 LAB — ECHOCARDIOGRAM COMPLETE
AV Mean grad: 4.3 mm[Hg]
AV Peak grad: 10.2 mm[Hg]
Ao pk vel: 1.59 m/s
Area-P 1/2: 3.68 cm2
Height: 70 in
Weight: 6232 [oz_av]

## 2023-10-21 LAB — HEPARIN LEVEL (UNFRACTIONATED): Heparin Unfractionated: 0.83 [IU]/mL — ABNORMAL HIGH (ref 0.30–0.70)

## 2023-10-21 MED ORDER — PERFLUTREN LIPID MICROSPHERE
1.0000 mL | INTRAVENOUS | Status: AC | PRN
  Administered 2023-10-21: 2 mL via INTRAVENOUS

## 2023-10-21 MED ORDER — APIXABAN 5 MG PO TABS
5.0000 mg | ORAL_TABLET | ORAL | Status: AC
  Administered 2023-10-21: 5 mg via ORAL
  Filled 2023-10-21: qty 1

## 2023-10-21 MED ORDER — APIXABAN 5 MG PO TABS: 5.0000 mg | ORAL_TABLET | Freq: Two times a day (BID) | ORAL | Status: DC

## 2023-10-21 MED ORDER — MENTHOL 3 MG MT LOZG
1.0000 | LOZENGE | OROMUCOSAL | Status: DC | PRN
  Administered 2023-10-21: 3 mg via ORAL
  Filled 2023-10-21: qty 9

## 2023-10-21 MED ORDER — POTASSIUM CHLORIDE CRYS ER 20 MEQ PO TBCR
40.0000 meq | EXTENDED_RELEASE_TABLET | Freq: Every day | ORAL | Status: DC
  Administered 2023-10-21: 40 meq via ORAL
  Filled 2023-10-21: qty 2

## 2023-10-21 MED ORDER — LOSARTAN POTASSIUM 50 MG PO TABS: 50.0000 mg | ORAL_TABLET | Freq: Every day | ORAL | 0 refills | Status: DC

## 2023-10-21 MED ORDER — HEPARIN (PORCINE) 25000 UT/250ML-% IV SOLN
1300.0000 [IU]/h | INTRAVENOUS | Status: DC
  Administered 2023-10-21: 1300 [IU]/h via INTRAVENOUS

## 2023-10-21 MED ORDER — APIXABAN 5 MG PO TABS: 5.0000 mg | ORAL_TABLET | Freq: Two times a day (BID) | ORAL | 0 refills | Status: DC

## 2023-10-21 MED ORDER — LOSARTAN POTASSIUM 50 MG PO TABS
50.0000 mg | ORAL_TABLET | Freq: Every day | ORAL | Status: DC
Start: 2023-10-21 — End: 2023-10-22
  Administered 2023-10-21: 50 mg via ORAL
  Filled 2023-10-21: qty 1

## 2023-10-21 NOTE — Plan of Care (Signed)
 Echo returned and showed EF 60 to 65% but difficult to assess wall motion abnormalities as study was technically limited due to body habitus.  Spoke with cardiology PA, Laymon Qua, recommended patient be transitioned from heparin  to Eliquis  and okay to discharge from cardiac standpoint.   Ryan Pagan, MD 10/21/2023, 3:23 PM PGY-2, [facility] Family Medicine Service pager [phone]

## 2023-10-21 NOTE — Discharge Instructions (Addendum)
 Thank you for letting us  care for you during your stay. You were admitted to the [facility] Medicine Teaching Service.   You were admitted for new onset atrial fibrillation (an irregular heart rhythm) and the flu.  You had an echocardiogram which showed a normal ejection fraction. You were started on 2 new medications which have been called into your pharmacy to pick up: - Eliquis  5mg  - take this twice daily - Losartan  50mg  - take this once daily  You should follow-up with the cardiologist and the atrial fibrillation clinic. Please follow up with your primary care physician in 1 week.   If your symptoms worsen or return, please return to the hospital.  Please let us  know if you have questions about your stay at [facility].   Information on my medicine - ELIQUIS  (apixaban )  This medication education was reviewed with me or my healthcare representative as part of my discharge preparation.   Why was Eliquis  prescribed for you? Eliquis  was prescribed for you to reduce the risk of a blood clot forming that can cause a stroke if you have a medical condition called atrial fibrillation (a type of irregular heartbeat).  What do You need to know about Eliquis  ? Take your Eliquis  TWICE DAILY - one tablet in the morning and one tablet in the evening with or without food. If you have difficulty swallowing the tablet whole please discuss with your pharmacist how to take the medication safely.  Take Eliquis  exactly as prescribed by your doctor and DO NOT stop taking Eliquis  without talking to the doctor who prescribed the medication.  Stopping may increase your risk of developing a stroke.  Refill your prescription before you run out.  After discharge, you should have regular check-up appointments with your healthcare provider that is prescribing your Eliquis .  In the future your dose may need to be changed if your kidney function or weight changes by a significant amount or as you get  older.  What do you do if you miss a dose? If you miss a dose, take it as soon as you remember on the same day and resume taking twice daily.  Do not take more than one dose of ELIQUIS  at the same time to make up a missed dose.  Important Safety Information A possible side effect of Eliquis  is bleeding. You should call your healthcare provider right away if you experience any of the following: Bleeding from an injury or your nose that does not stop. Unusual colored urine (red or dark brown) or unusual colored stools (red or black). Unusual bruising for unknown reasons. A serious fall or if you hit your head (even if there is no bleeding).  Some medicines may interact with Eliquis  and might increase your risk of bleeding or clotting while on Eliquis . To help avoid this, consult your healthcare provider or pharmacist prior to using any new prescription or non-prescription medications, including herbals, vitamins, non-steroidal anti-inflammatory drugs (NSAIDs) and supplements.  This website has more information on Eliquis  (apixaban ): http://www.eliquis .com/eliquis dena

## 2023-10-21 NOTE — Assessment & Plan Note (Addendum)
 Very likely related to demand ischemia in the setting of influenza A.  Troponins did peak and are downtrending; will not continue to trend. - Follow-up echocardiogram

## 2023-10-21 NOTE — Assessment & Plan Note (Addendum)
 Has been uncontrolled during his hospitalization, as high as 160s over 120s. - Continue home metoprolol 100 mg - Per cardiology added losartan 50 mg daily

## 2023-10-21 NOTE — Progress Notes (Signed)
 PHARMACY - ANTICOAGULATION CONSULT NOTE  Pharmacy Consult for heparin  gtt > apixaban  Indication: chest pain/ACS/Afib  No Known Allergies  Patient Measurements: Height: 5' 10 (177.8 cm) Weight: (!) 176.7 kg (389 lb 8 oz) IBW/kg (Calculated) : 73 Heparin  Dosing Weight: 114 kg   Vital Signs: Temp: 97.9 F (36.6 C) (01/01 1501) Temp Source: Oral (01/01 1501) BP: 140/82 (01/01 1501) Pulse Rate: 94 (01/01 1151)  Labs: Recent Labs    10/20/23 1006 10/20/23 1312 10/20/23 1846 10/20/23 2027 10/21/23 0228 10/21/23 0848  HGB 14.7  --   --   --   --   --   HCT 43.2  --   --   --   --   --   PLT 229  --   --   --   --   --   APTT  --   --   --   --  36  --   HEPARINUNFRC  --   --   --   --   --  0.83*  CREATININE 0.91  --   --   --  0.82  --   TROPONINIHS 135* 156* 111* 108*  --   --     Estimated Creatinine Clearance: 145.5 mL/min (by C-G formula based on SCr of 0.82 mg/dL).   Medical History: Past Medical History:  Diagnosis Date   Anxiety    Arthritis    knees (03/05/2018)   Cigarette smoker    Diastolic dysfunction    Diverticulosis of colon    DJD (degenerative joint disease)    Fatty liver disease, nonalcoholic    Hemorrhoids    Hx of colonic polyps    Hypercholesteremia    Hypertension    Obesity    Seborrheic dermatitis    Slurred speech    Stenosis of rectum and anus    TIA (transient ischemic attack) 03/05/2018   Venous insufficiency    Assessment: 68 yo M with afib plus elevated troponin.   Trop 135>156 . Apixaban  5mg  given at 1405  CBC WNL  Heparin  drip 1300 u/h > rate decreased this am after elevated heparinlevel 0.8 Plan to dc on apixaban  5mg  BID Give dose prior to DC tonight   Goal of Therapy:  Heparin  level 0.3-0.7 units/ml Monitor platelets by anticoagulation protocol: Yes   Plan:  Stop heparin  at DC Give apixaban  5mg  x1  Then continue apixaban  5mg  BID at DC     [employer] Pharm.D. CPP, BCPS Clinical  Pharmacist [phone] 10/21/2023 4:29 PM

## 2023-10-21 NOTE — Plan of Care (Signed)

## 2023-10-21 NOTE — Progress Notes (Signed)
     Daily Progress Note Intern Pager: [phone]  Patient name: Ryan [name] Medical record number: [id number] Date of birth: 03/16/1956 Age: 68 y.o. Gender: male  Primary Care Provider: Christi Glendia HERO, MD Consultants: Cardiology Code Status: Full  Pt Overview and Major Events to Date:  10/20/2023-admitted 10/21/2023-echocardiogram  Assessment and Plan: Ryan [name] is a 68 y.o. male presenting with new onset afib and found to be flu positive; initial elevated troponins likely due to demand ischemia.  Now rate controlled on his home metoprolol .    Plan for echocardiogram today; pending results patient may be a good candidate for discharge. Assessment & Plan New onset a-fib [facility]) New onset A-fib with CHA2DS2-VASc score 2, low stroke risk.  Troponins peaked at 156 and have been downtrending for 2 draws since then.  He is rate controlled on home metoprolol . -cardiology following, appreciate recommendations -Continue home Metoprolol  100mg  -IV heparin  for short term anticoagulation per cardiology -Follow-up echocardiogram -May need outpatient PET stress test in the future Elevated troponin Very likely related to demand ischemia in the setting of influenza A.  Troponins did peak and are downtrending; will not continue to trend. - Follow-up echocardiogram Hypertension Has been uncontrolled during his hospitalization, as high as 160s over 120s. - Continue home metoprolol  100 mg - Per cardiology added losartan  50 mg daily   Chronic and/or Stable Problems:  HTN: continue home metoprolol  and lasix  40 mg HLD: continue home atorvastatin  40 mg Flu A: Outside of the window for Tamiflu, continue supportive care   FEN/GI: Heart healthy diet PPx: Heparin  Dispo:Home pending clinical improvement . Barriers include clinical stability.   Subjective:  Patient seen lying in bed this morning with wife at bedside.  He states he is doing well, denies chest pain/pressure, racing heart, difficulty  breathing.  Doing well overall.  He is hopeful to go home soon.  Objective: Temp:  [97.3 F (36.3 C)-99.2 F (37.3 C)] 97.9 F (36.6 C) (01/01 0711) Pulse Rate:  [68-94] 78 (01/01 0304) Resp:  [7-40] 15 (01/01 0304) BP: (131-161)/(90-125) 131/91 (01/01 0304) SpO2:  [93 %-99 %] 99 % (01/01 0304) Weight:  [167.8 kg-180.4 kg] 176.7 kg (12/31 1958) Physical Exam: General: Well-appearing, pleasant, NAD Cardiovascular: RRR, no murmurs Respiratory: Diffuse wheezing bilaterally, but speaks in full sentences without increased work of breathing on room air. Abdomen: Soft, nontender, with normoactive bowel sounds throughout Extremities: Moves all extremities equally  Laboratory: Most recent CBC Lab Results  Component Value Date   WBC 8.9 10/20/2023   HGB 14.7 10/20/2023   HCT 43.2 10/20/2023   MCV 88.3 10/20/2023   PLT 229 10/20/2023   Most recent BMP    Latest Ref Rng & Units 10/21/2023    2:28 AM  BMP  Glucose 70 - 99 mg/dL 873   BUN 8 - 23 mg/dL 16   Creatinine 9.38 - 1.24 mg/dL 9.17   Sodium 864 - 854 mmol/L 140   Potassium 3.5 - 5.1 mmol/L 3.4   Chloride 98 - 111 mmol/L 101   CO2 22 - 32 mmol/L 27   Calcium  8.9 - 10.3 mg/dL 9.1    Troponin 864> 843> 111> 108  10/20/2023 CXR: No acute cardiopulmonary disease  Lafe Domino, DO 10/21/2023, 7:36 AM  PGY-1, [facility] FPTS Intern pager: [phone], text pages welcome Secure chat group [facility] Teaching Service

## 2023-10-21 NOTE — Discharge Summary (Signed)
 Family Medicine Teaching [facility] Discharge Summary  Patient name: Ryan [name] Medical record number: [id number] Date of birth: Jan 08, 1956 Age: 68 y.o. Gender: male Date of Admission: 10/20/2023  Date of Discharge: 10/21/2023 Admitting Physician: Rollene FORBES Keeling, MD  Primary Care Provider: Christi Glendia HERO, MD Consultants: Radiology  Indication for Hospitalization: Chest pain, found to be in new onset A-fib  Brief Hospital Course:  Ryan [name] is a 68 y.o. male who was admitted to the [facility] Medicine Teaching Service at [facility] for new onset Afib with chest pain and elevated troponins. Hospital course is outlined below by problem.   New onset Atrial Fibrillation Patient adequately rate controlled on home metoprolol . No prior history of CAD or CHF. Troponin peaked at 156. Heparin  drip was started for anticoagulation. Echocardiogram was obtained and showed LVEF 60-65% though study was limited by body habitus. Prior to discharge he was transitioned to Eliquis  5mg  twice daily for anticoagulation.   Hypertension Patient initially with uncontrolled HTN. Continued on home metoprolol  with addition of losartan  50mg  daily. Pressures did normalize somewhat to 140s over 80s by time of discharge. Patient remained asymptomatic throughout admission.  Influenza A Found to be flu+ on admission but outside of the window for Tamiflu so this was not started. Supportive care was provided as indicated but patient remained largely asymptomatic and was discharged home in stable condition.  Other conditions that were chronic and stable were managed with home medications or formulary alternatives.   Issues for follow up: Outpatient PET stress test  Repeat TSH outpatient Follow up BMP after starting losartan  Continue Eliquis  5 mg twice daily and follow-up with cardiology    Discharge Diagnoses/Problem List:  Principal Problem:   Atrial fibrillation (HCC) Active Problems:   Hypertension   New  onset a-fib (HCC)   Elevated troponin  Disposition: Home  Discharge Condition: Stable  Discharge Exam:  General: Well-appearing, pleasant, NAD Cardiovascular: RRR, no murmurs Respiratory: Diffuse wheezing bilaterally, but speaks in full sentences without increased work of breathing on room air. Abdomen: Soft, nontender, with normoactive bowel sounds throughout Extremities: Moves all extremities equally  Significant Procedures:   Significant Labs and Imaging:  Recent Labs  Lab 10/20/23 1006  WBC 8.9  HGB 14.7  HCT 43.2  PLT 229   Recent Labs  Lab 10/20/23 1006 10/21/23 0228  NA 138 140  K 3.8 3.4*  CL 100 101  CO2 24 27  GLUCOSE 117* 126*  BUN 12 16  CREATININE 0.91 0.82  CALCIUM  9.2 9.1  ALKPHOS 92 79  AST 31 29  ALT 25 23  ALBUMIN 3.5 3.1*    ECHOCARDIOGRAM COMPLETE Result Date: 10/21/2023    ECHOCARDIOGRAM REPORT   Patient Name:   Ryan [name] Date of Exam: 10/21/2023 Medical Rec #:  [id number]      Height:       70.0 in Accession #:    [id number]     Weight:       389.5 lb Date of Birth:  1956-01-22      BSA:          2.771 m Patient Age:    67 years       BP:           163/99 mmHg Patient Gender: M              HR:           81 bpm. Exam Location:  Inpatient Procedure: 2D Echo, Cardiac Doppler, Color Doppler  and Intracardiac            Opacification Agent Indications:    Afib  History:        Patient has no prior history of Echocardiogram examinations.                 TIA; Risk Factors:Hypertension.  Sonographer:    Jayson Gaskins Referring Phys: [phone] HAO MENG IMPRESSIONS  1. Left ventricular ejection fraction, by estimation, is 60 to 65%. The left ventricle has normal function. Left ventricular endocardial border not optimally defined to evaluate regional wall motion. not well visualized left ventricular hypertrophy. Left ventricular diastolic parameters are indeterminate.  2. Right ventricular systolic function was not well visualized. The right ventricular size is  not well visualized. Tricuspid regurgitation signal is inadequate for assessing PA pressure.  3. The mitral valve was not well visualized. Trivial mitral valve regurgitation. No evidence of mitral stenosis.  4. The aortic valve was not well visualized. Aortic valve regurgitation is not visualized. No aortic stenosis is present.  5. Aortic dilatation noted. There is mild dilatation of the ascending aorta, measuring 41 mm.  6. Technically difficult study, limited visualization FINDINGS  Left Ventricle: Left ventricular ejection fraction, by estimation, is 60 to 65%. The left ventricle has normal function. Left ventricular endocardial border not optimally defined to evaluate regional wall motion. The left ventricular internal cavity size was normal in size. Not well visualized left ventricular hypertrophy. Left ventricular diastolic parameters are indeterminate. Right Ventricle: The right ventricular size is not well visualized. Right vetricular wall thickness was not well visualized. Right ventricular systolic function was not well visualized. Tricuspid regurgitation signal is inadequate for assessing PA pressure. Left Atrium: Left atrial size was not well visualized. Right Atrium: Right atrial size was not well visualized. Pericardium: There is no evidence of pericardial effusion. Mitral Valve: The mitral valve was not well visualized. Trivial mitral valve regurgitation. No evidence of mitral valve stenosis. Tricuspid Valve: The tricuspid valve is not well visualized. Tricuspid valve regurgitation is not demonstrated. No evidence of tricuspid stenosis. Aortic Valve: The aortic valve was not well visualized. Aortic valve regurgitation is not visualized. No aortic stenosis is present. Aortic valve mean gradient measures 4.3 mmHg. Aortic valve peak gradient measures 10.2 mmHg. Pulmonic Valve: The pulmonic valve was not well visualized. Pulmonic valve regurgitation is not visualized. No evidence of pulmonic stenosis.  Aorta: The aortic root was not well visualized and aortic dilatation noted. There is mild dilatation of the ascending aorta, measuring 41 mm. IAS/Shunts: The interatrial septum was not well visualized.   Diastology LV e' medial:    8.59 cm/s LV E/e' medial:  15.1 LV e' lateral:   9.46 cm/s LV E/e' lateral: 13.7  RIGHT VENTRICLE RV S prime:     13.40 cm/s LEFT ATRIUM              Index LA Vol (A2C):   80.1 ml  28.91 ml/m LA Vol (A4C):   189.0 ml 68.21 ml/m LA Biplane Vol: 125.0 ml 45.11 ml/m  AORTIC VALVE AV Vmax:           159.49 cm/s AV Vmean:          92.907 cm/s AV VTI:            0.262 m AV Peak Grad:      10.2 mmHg AV Mean Grad:      4.3 mmHg LVOT Vmax:         113.00 cm/s LVOT Vmean:  83.400 cm/s LVOT VTI:          0.198 m LVOT/AV VTI ratio: 0.76  AORTA Ao Asc diam: 41.00 cm MITRAL VALVE MV Area (PHT): 3.68 cm     SHUNTS MV Decel Time: 206 msec     Systemic VTI: 0.20 m MV E velocity: 130.00 cm/s Dorn Ross MD Electronically signed by Dorn Ross MD Signature Date/Time: 10/21/2023/2:45:38 PM    Final    DG Chest 2 View Result Date: 10/20/2023 CLINICAL DATA:  Shortness of breath, chest tightness EXAM: CHEST - 2 VIEW COMPARISON:  12/31/2016 FINDINGS: Linear scarring or atelectasis laterally at the left lung base as before. No new infiltrate or overt edema. Heart size and mediastinal contours are within normal limits. No effusion. Visualized bones unremarkable. IMPRESSION: No acute cardiopulmonary disease. Electronically Signed   By: JONETTA Faes M.D.   On: 10/20/2023 12:22     Results/Tests Pending at Time of Discharge: None  Discharge Medications:  Allergies as of 10/21/2023   No Known Allergies      Medication List     STOP taking these medications    aspirin  EC 325 MG tablet   naproxen  sodium 220 MG tablet Commonly known as: ALEVE    traMADol  50 MG tablet Commonly known as: ULTRAM        TAKE these medications    apixaban  5 MG Tabs tablet Commonly known as:  ELIQUIS  Take 1 tablet (5 mg total) by mouth 2 (two) times daily.   atorvastatin  40 MG tablet Commonly known as: LIPITOR Take 1 tablet (40 mg total) by mouth daily.   COLLAGEN PO Take 1 capsule by mouth daily.   furosemide  40 MG tablet Commonly known as: LASIX  TAKE 1 TABLET DAILY   losartan  50 MG tablet Commonly known as: COZAAR  Take 1 tablet (50 mg total) by mouth daily. Start taking on: October 22, 2023   metoprolol  succinate 100 MG 24 hr tablet Commonly known as: TOPROL -XL TAKE 1 TABLET DAILY   omeprazole  20 MG tablet Commonly known as: PRILOSEC  OTC Take 20 mg by mouth daily. 30 minutes before a meal AS NEEDED   One-A-Day Mens 50+ Tabs Take 1 tablet by mouth daily.   Potassium Chloride  ER 20 MEQ Tbcr Take 20 mEq by mouth daily.   VITAMIN D PO Take 1 tablet by mouth daily.        Discharge Instructions: Please refer to Patient Instructions section of EMR for full details.  Patient was counseled important signs and symptoms that should prompt return to medical care, changes in medications, dietary instructions, activity restrictions, and follow up appointments.   Follow-Up Appointments:   Lafe Domino, DO 10/21/2023, 3:32 PM PGY-1, [facility] Family Medicine

## 2023-10-21 NOTE — Assessment & Plan Note (Signed)
 New onset A-fib with CHA2DS2-VASc score 2, low stroke risk.  Troponins peaked at 156 and have been downtrending for 2 draws since then.  He is rate controlled on home metoprolol . -cardiology following, appreciate recommendations -Continue home Metoprolol  100mg  -IV heparin  for short term anticoagulation per cardiology -Follow-up echocardiogram -May need outpatient PET stress test in the future

## 2023-10-21 NOTE — Care Management Obs Status (Signed)
 MEDICARE OBSERVATION STATUS NOTIFICATION   Patient Details  Name: Ryan [name] MRN: [id number] Date of Birth: [birth year]   Medicare Observation Status Notification Given:  Waylan Boga, RN 10/21/2023, 6:50 PM

## 2023-10-21 NOTE — Plan of Care (Signed)
 Problem: Education: Goal: Knowledge of General Education information will improve Description: Including pain rating scale, medication(s)/side effects and non-pharmacologic comfort measures 10/21/2023 1804 by Edith Wyline SAILOR, RN Outcome: Adequate for Discharge 10/21/2023 1804 by Edith Wyline SAILOR, RN Outcome: Progressing 10/21/2023 1351 by Edith Wyline SAILOR, RN Outcome: Progressing   Problem: Health Behavior/Discharge Planning: Goal: Ability to manage health-related needs will improve 10/21/2023 1804 by Edith Wyline SAILOR, RN Outcome: Adequate for Discharge 10/21/2023 1804 by Edith Wyline SAILOR, RN Outcome: Progressing 10/21/2023 1351 by Edith Wyline SAILOR, RN Outcome: Progressing   Problem: Clinical Measurements: Goal: Ability to maintain clinical measurements within normal limits will improve 10/21/2023 1804 by Edith Wyline SAILOR, RN Outcome: Adequate for Discharge 10/21/2023 1804 by Edith Wyline SAILOR, RN Outcome: Progressing 10/21/2023 1351 by Edith Wyline SAILOR, RN Outcome: Progressing Goal: Will remain free from infection 10/21/2023 1804 by Edith Wyline SAILOR, RN Outcome: Adequate for Discharge 10/21/2023 1804 by Edith Wyline SAILOR, RN Outcome: Progressing 10/21/2023 1351 by Edith Wyline SAILOR, RN Outcome: Progressing Goal: Diagnostic test results will improve 10/21/2023 1804 by Edith Wyline SAILOR, RN Outcome: Adequate for Discharge 10/21/2023 1804 by Edith Wyline SAILOR, RN Outcome: Progressing 10/21/2023 1351 by Edith Wyline SAILOR, RN Outcome: Progressing Goal: Respiratory complications will improve 10/21/2023 1804 by Edith Wyline SAILOR, RN Outcome: Adequate for Discharge 10/21/2023 1804 by Edith Wyline SAILOR, RN Outcome: Progressing 10/21/2023 1351 by Edith Wyline SAILOR, RN Outcome: Progressing Goal: Cardiovascular complication will be avoided 10/21/2023 1804 by Edith Wyline SAILOR, RN Outcome: Adequate for Discharge 10/21/2023 1804 by Edith Wyline SAILOR, RN Outcome: Progressing 10/21/2023 1351 by Edith Wyline SAILOR, RN Outcome: Progressing    Problem: Activity: Goal: Risk for activity intolerance will decrease 10/21/2023 1804 by Edith Wyline SAILOR, RN Outcome: Adequate for Discharge 10/21/2023 1804 by Edith Wyline SAILOR, RN Outcome: Progressing 10/21/2023 1351 by Edith Wyline SAILOR, RN Outcome: Progressing   Problem: Nutrition: Goal: Adequate nutrition will be maintained 10/21/2023 1804 by Edith Wyline SAILOR, RN Outcome: Adequate for Discharge 10/21/2023 1804 by Edith Wyline SAILOR, RN Outcome: Progressing 10/21/2023 1351 by Edith Wyline SAILOR, RN Outcome: Progressing   Problem: Coping: Goal: Level of anxiety will decrease 10/21/2023 1804 by Edith Wyline SAILOR, RN Outcome: Adequate for Discharge 10/21/2023 1804 by Edith Wyline SAILOR, RN Outcome: Progressing 10/21/2023 1351 by Edith Wyline SAILOR, RN Outcome: Progressing   Problem: Elimination: Goal: Will not experience complications related to bowel motility 10/21/2023 1804 by Edith Wyline SAILOR, RN Outcome: Adequate for Discharge 10/21/2023 1804 by Edith Wyline SAILOR, RN Outcome: Progressing 10/21/2023 1351 by Edith Wyline SAILOR, RN Outcome: Progressing Goal: Will not experience complications related to urinary retention 10/21/2023 1804 by Edith Wyline SAILOR, RN Outcome: Adequate for Discharge 10/21/2023 1804 by Edith Wyline SAILOR, RN Outcome: Progressing 10/21/2023 1351 by Edith Wyline SAILOR, RN Outcome: Progressing   Problem: Pain Management: Goal: General experience of comfort will improve 10/21/2023 1804 by Edith Wyline SAILOR, RN Outcome: Adequate for Discharge 10/21/2023 1804 by Edith Wyline SAILOR, RN Outcome: Progressing 10/21/2023 1351 by Edith Wyline SAILOR, RN Outcome: Progressing   Problem: Safety: Goal: Ability to remain free from injury will improve 10/21/2023 1804 by Edith Wyline SAILOR, RN Outcome: Adequate for Discharge 10/21/2023 1804 by Edith Wyline SAILOR, RN Outcome: Progressing 10/21/2023 1351 by Edith Wyline SAILOR, RN Outcome: Progressing   Problem: Skin Integrity: Goal: Risk for impaired skin integrity will decrease 10/21/2023  1804 by Edith Wyline SAILOR, RN Outcome: Adequate for Discharge 10/21/2023 1804 by Edith Wyline SAILOR, RN Outcome: Progressing 10/21/2023 1351 by Edith Wyline SAILOR, RN  Outcome: Progressing   Problem: Education: Goal: Knowledge of disease or condition will improve 10/21/2023 1804 by Edith Wyline SAILOR, RN Outcome: Adequate for Discharge 10/21/2023 1804 by Edith Wyline SAILOR, RN Outcome: Progressing 10/21/2023 1351 by Edith Wyline SAILOR, RN Outcome: Progressing Goal: Understanding of medication regimen will improve 10/21/2023 1804 by Edith Wyline SAILOR, RN Outcome: Adequate for Discharge 10/21/2023 1804 by Edith Wyline SAILOR, RN Outcome: Progressing 10/21/2023 1351 by Edith Wyline SAILOR, RN Outcome: Progressing Goal: Individualized Educational Video(s) 10/21/2023 1804 by Edith Wyline SAILOR, RN Outcome: Adequate for Discharge 10/21/2023 1804 by Edith Wyline SAILOR, RN Outcome: Progressing 10/21/2023 1351 by Edith Wyline SAILOR, RN Outcome: Progressing   Problem: Activity: Goal: Ability to tolerate increased activity will improve 10/21/2023 1804 by Edith Wyline SAILOR, RN Outcome: Adequate for Discharge 10/21/2023 1804 by Edith Wyline SAILOR, RN Outcome: Progressing 10/21/2023 1351 by Edith Wyline SAILOR, RN Outcome: Progressing   Problem: Cardiac: Goal: Ability to achieve and maintain adequate cardiopulmonary perfusion will improve 10/21/2023 1804 by Edith Wyline SAILOR, RN Outcome: Adequate for Discharge 10/21/2023 1804 by Edith Wyline SAILOR, RN Outcome: Progressing 10/21/2023 1351 by Edith Wyline SAILOR, RN Outcome: Progressing   Problem: Health Behavior/Discharge Planning: Goal: Ability to safely manage health-related needs after discharge will improve 10/21/2023 1804 by Edith Wyline SAILOR, RN Outcome: Adequate for Discharge 10/21/2023 1804 by Edith Wyline SAILOR, RN Outcome: Progressing 10/21/2023 1351 by Edith Wyline SAILOR, RN Outcome: Progressing

## 2023-10-21 NOTE — Progress Notes (Addendum)
   Patient Name: Ryan [name] Date of Encounter: 10/21/2023 [facility] Cardiologist: None   Interval Summary  .    Feels well Blood pressure better Echocardiogram pending  Vital Signs .    Vitals:   10/21/23 0017 10/21/23 0019 10/21/23 0304 10/21/23 0711  BP: (!) 139/90 (!) 139/90 (!) 131/91   Pulse: 85 88 78   Resp: (!) 29 16 15    Temp: 97.9 F (36.6 C)  (!) 97.3 F (36.3 C) 97.9 F (36.6 C)  TempSrc: Oral  Axillary Oral  SpO2: 95% 93% 99%   Weight:      Height:        Intake/Output Summary (Last 24 hours) at 10/21/2023 1112 Last data filed at 10/21/2023 0715 Gross per 24 hour  Intake 360 ml  Output 550 ml  Net -190 ml      10/20/2023    7:58 PM 10/20/2023    9:47 AM 10/20/2023    8:34 AM  Last 3 Weights  Weight (lbs) 389 lb 8 oz 370 lb 397 lb 11.4 oz  Weight (kg) 176.676 kg 167.831 kg 180.4 kg      Telemetry/ECG     - Personally Reviewed Telemetry 10/21/2023: Rate controlled Afib.   Physical Exam .   Physical Exam Vitals and nursing note reviewed.  Constitutional:      General: He is not in acute distress.    Appearance: He is obese.  Neck:     Vascular: No JVD.  Cardiovascular:     Rate and Rhythm: Normal rate. Rhythm irregular.     Heart sounds: Normal heart sounds. No murmur heard. Pulmonary:     Effort: Pulmonary effort is normal.     Breath sounds: Normal breath sounds. No wheezing or rales.  Musculoskeletal:     Right lower leg: No edema.     Left lower leg: No edema.      Assessment & Plan .     68 year old male with hypertension, obesity, OSA not CPAP, dilated aortic root, influenza A positive, new diagnosis A-fib and elevated troponin  A-fib: New diagnosis, possibly persistent Multiple risk factors including obesity, untreated OSA Rate controlled on metoprolol  succinate 100 mg daily CHA2DS2VASc score 2, annual stroke risk 2.2% Okay to continue IV heparin  for now given elevated troponin. If echocardiogram shows normal EF  and no significant wall motion abnormality, heparin  can be discontinued in favor of oral anticoagulation with Eliquis  5 mg twice daily. Recommend stopping Aspirin  325 mg daily.    Elevated troponin: Likely type II MI in the setting of positive influenza A. If echocardiogram does not show significant abnormality, consider outpatient ischemic workup with PET/CT stress test.  I would avoid stress testing given obesity and possibility of artifacts.   Hypertension: BP better today. He is reluctant to add any medication at this time given home BP is well controlled.  In future, if SBP remains >140 mmHg or DBP >90 mmHg, recommend adding losartan  50 mg daily.    If echocardiogram unremarkable, IV heparin  can be stopped and changed to Eliquis  5 mg bid and patient can be discharged home. Will arrange outpatient follow up.  Discussed with primary team.   For questions or updates, please contact [facility] Please consult [email or web address] for contact info under        Signed, Newman JINNY Lawrence, MD

## 2023-10-21 NOTE — Care Management CC44 (Signed)
 Condition Code 44 Documentation Completed  Patient Details  Name: IBAN UTZ MRN: [id number] Date of Birth: 16-Jun-1956   Condition Code 44 given:  Yes Patient signature on Condition Code 44 notice:  Yes Documentation of 2 MD's agreement:  Yes Code 44 added to claim:  Yes    Margareta Laureano, RN 10/21/2023, 6:50 PM

## 2023-10-26 ENCOUNTER — Inpatient Hospital Stay: Payer: Medicare Other | Admitting: Oncology

## 2023-10-27 ENCOUNTER — Ambulatory Visit (HOSPITAL_COMMUNITY): Payer: Medicare Other | Admitting: Internal Medicine

## 2023-10-29 ENCOUNTER — Ambulatory Visit: Payer: Self-pay

## 2023-10-30 ENCOUNTER — Ambulatory Visit (HOSPITAL_COMMUNITY): Payer: Medicare Other | Admitting: Internal Medicine

## 2023-11-03 ENCOUNTER — Encounter: Payer: Self-pay | Admitting: Oncology

## 2023-11-03 ENCOUNTER — Inpatient Hospital Stay: Payer: Medicare Other | Attending: Oncology | Admitting: Oncology

## 2023-11-03 VITALS — BP 145/88 | HR 86 | Temp 98.0°F | Resp 20 | Ht 70.0 in | Wt 388.2 lb

## 2023-11-03 DIAGNOSIS — Z7901 Long term (current) use of anticoagulants: Secondary | ICD-10-CM | POA: Diagnosis not present

## 2023-11-03 DIAGNOSIS — I4891 Unspecified atrial fibrillation: Secondary | ICD-10-CM | POA: Diagnosis not present

## 2023-11-03 DIAGNOSIS — Z0000 Encounter for general adult medical examination without abnormal findings: Secondary | ICD-10-CM

## 2023-11-03 DIAGNOSIS — D72829 Elevated white blood cell count, unspecified: Secondary | ICD-10-CM | POA: Diagnosis present

## 2023-11-03 NOTE — Assessment & Plan Note (Signed)
 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 Differential includes infection, inflammation, and less likely bone marrow disorders such as leukemia. Given the long-standing nature and stability, a bone marrow disorder is unlikely. Chronic inflammation from arthritis is a possible contributing factor. Further testing will include inflammatory markers and bone marrow mutation tests to rule out underlying issues.  -Today I ordered CBCD, CMP, LDH, ESR, CRP, BCR/ABL 1, JAK2 mutation analysis, flow cytometry of peripheral blood for further evaluation of the leukocytosis.  - Arrange follow-up phone call in two weeks to discuss results  He was provided reassurance.  Most likely we will continue monitoring alone.  Plan to see him again in 3 months for follow-up.

## 2023-11-03 NOTE — Assessment & Plan Note (Signed)
 Significant weight loss and actively working on [employer]. Quit smoking and drinking several years ago. Discussed the importance of maintaining a healthy diet and regular exercise to support overall health and manage arthritis symptoms. - Encourage continued weight management and regular exercise - Reinforce the importance of maintaining a healthy diet and lifestyle

## 2023-11-03 NOTE — Assessment & Plan Note (Signed)
 Recent episode likely triggered by a flu infection. Currently on Eliquis  and losartan . Echocardiogram and EKG have been normal. Discussed that the recent AFib episode may be a one-time occurrence related to the flu. Emphasized the importance of continuing Eliquis  to prevent clot formation and losartan  for blood pressure management. Patient hopes to discontinue Eliquis  if AFib is determined to be a one-time event. - Continue Eliquis  5 mg twice daily - Continue losartan  - Follow-up with cardiologist on Thursday for further evaluation and management

## 2023-11-03 NOTE — Progress Notes (Signed)
 [facility]  HEMATOLOGY CLINIC CONSULTATION NOTE    PATIENT NAME: Ryan [name]   MR#: [id number] DOB: 1956/01/22  DATE OF SERVICE: 11/03/2023  REFERRING PROVIDER:  Dawayne Kerney SQUIBB, MD   Patient Care Team: Dawayne Kerney SQUIBB, MD as PCP - General (Internal Medicine)  REASON FOR CONSULTATION/ CHIEF COMPLAINT:  Evaluation of leukocytosis.  ASSESSMENT & PLAN:  Ryan [name] is a 68 y.o. gentleman with a past medical history of hypertension, dyslipidemia, TIA, hepatic steatosis, arthritis, past nicotine dependence, was referred to our service for evaluation of leukocytosis.    Leukocytosis Chronic leukocytosis since at least 2010 with WBC counts ranging from 13,000 to 15,000. Differential includes infection, inflammation, and less likely bone marrow disorders such as leukemia. Given the long-standing nature and stability, a bone marrow disorder is unlikely. Chronic inflammation from arthritis is a possible contributing factor. Further testing will include inflammatory markers and bone marrow mutation tests to rule out underlying issues.  -Today I ordered CBCD, CMP, LDH, ESR, CRP, BCR/ABL 1, JAK2 mutation analysis, flow cytometry of peripheral blood for further evaluation of the leukocytosis.  - Arrange follow-up phone call in two weeks to discuss results  He was provided reassurance.  Most likely we will continue monitoring alone.  Plan to see him again in 3 months for follow-up.  Atrial fibrillation (HCC) Recent episode likely triggered by a flu infection. Currently on Eliquis  and losartan . Echocardiogram and EKG have been normal. Discussed that the recent AFib episode may be a one-time occurrence related to the flu. Emphasized the importance of continuing Eliquis  to prevent clot formation and losartan  for blood pressure management. Patient hopes to discontinue Eliquis  if AFib is determined to be a one-time event. - Continue Eliquis  5 mg twice daily - Continue  losartan  - Follow-up with cardiologist on Thursday for further evaluation and management  Healthcare maintenance Significant weight loss and actively working on weight management. Quit smoking and drinking several years ago. Discussed the importance of maintaining a healthy diet and regular exercise to support overall health and manage arthritis symptoms. - Encourage continued weight management and regular exercise - Reinforce the importance of maintaining a healthy diet and lifestyle    I reviewed lab results and outside records for this visit and discussed relevant results with the patient. Diagnosis, plan of care and treatment options were also discussed in detail with the patient. Opportunity provided to ask questions and answers provided to his apparent satisfaction. Provided instructions to call our clinic with any problems, questions or concerns prior to return visit. I recommended to continue follow-up with PCP and sub-specialists. He verbalized understanding and agreed with the plan. No barriers to learning was detected.  [name], MD  11/03/2023 4:15 PM   C[facility]ER CENTER [facility] - A DEPT OF [facility] [address] [place] [address] Dept: [phone] Dept Fax: [phone]   HISTORY OF PRESENTING ILLNESS:   Discussed the use of AI scribe software for clinical note transcription with the patient, who gave verbal consent to proceed.   On 09/09/2023, iron  studies, B12, folate were all within normal limits.  CMP grossly unremarkable.  White count was elevated at 13,700 with ANC of 1600, normal differential.  Hemoglobin 15.6, platelet count 267,000.  Previously white count was elevated at 15,000 in August 2024.  Given persistent leukocytosis, referral was sent to us  for further evaluation.  On review of records, he has had chronic, intermittent leukocytosis dating back to  August 2010 with white count ranging from  10,000-15,000.  The patient first became aware of this issue in 2019, but the abnormality has been present since at least 2010. The patient has been monitoring the count through an online portal and notes that it fluctuates slightly but remains above the upper limit of normal. The patient denies any recent infections but does report a history of arthritis, which is severe and affects both shoulders and knees. The arthritis has been treated with steroid injections, which provide temporary relief. The patient also reports a recent diagnosis of atrial fibrillation, which occurred during a recent hospitalization for a flu-like illness and a GI virus. The patient is currently on Eliquis  and Losartan  for this condition.  There is no reported symptoms of sinus congestion, cough, urinary frequency/urgency or dysuria, diarrhea, joint swelling/pain or abnormal skin rash.   He had no prior history or diagnosis of cancer. His age appropriate screening programs are up-to-date.   MEDICAL HISTORY:  Past Medical History:  Diagnosis Date   Anxiety    Arthritis    knees (03/05/2018)   Cigarette smoker    Diastolic dysfunction    Diverticulosis of colon    DJD (degenerative joint disease)    Fatty liver disease, nonalcoholic    Hemorrhoids    Hx of colonic polyps    Hypercholesteremia    Hypertension    Obesity    Seborrheic dermatitis    Slurred speech    Stenosis of rectum and anus    TIA (transient ischemic attack) 03/05/2018   Venous insufficiency     SURGICAL HISTORY: Past Surgical History:  Procedure Laterality Date   COLONOSCOPY W/ POLYPECTOMY  06/27/2009   FLEXIBLE SIGMOIDOSCOPY     KNEE ARTHROSCOPY Right 11/2003   Dr. Heide   KNEE ARTHROSCOPY Left 05/24/2012   torn meniscus    TONSILLECTOMY      SOCIAL HISTORY: He reports that he quit smoking about 14 years ago. His smoking use included cigarettes. He started smoking about 41 years ago. He has a 27 pack-year smoking history. He  has never used smokeless tobacco. He reports current alcohol use of about 8.0 standard drinks of alcohol per week. He reports that he does not use drugs. Social History   Socioeconomic History   Marital status: Widowed    Spouse name: Not on file   Number of children: 2   Years of education: Not on file   Highest education level: Not on file  Occupational History   Not on file  Tobacco Use   Smoking status: Former    Current packs/day: 0.00    Average packs/day: 1 pack/day for 27.0 years (27.0 ttl pk-yrs)    Types: Cigarettes    Start date: 10/20/1982    Quit date: 10/20/2009    Years since quitting: 14.0   Smokeless tobacco: Never  Vaping Use   Vaping status: Never Used  Substance and Sexual Activity   Alcohol use: Yes    Alcohol/week: 8.0 standard drinks of alcohol    Types: 8 Cans of beer per week   Drug use: Never   Sexual activity: Not Currently  Other Topics Concern   Not on file  Social History Narrative   Not on file   Social Drivers of Health   Financial Resource Strain: Not on file  Food Insecurity: No Food Insecurity (11/03/2023)   Hunger Vital Sign    Worried About Running Out of Food in the Last Year: Never true    Ran  Out of Food in the Last Year: Never true  Transportation Needs: No Transportation Needs (11/03/2023)   PRAPARE - Administrator, Civil Service (Medical): No    Lack of Transportation (Non-Medical): No  Physical Activity: Not on file  Stress: Not on file  Social Connections: Socially Isolated (10/20/2023)   Social Connection and Isolation Panel [NHANES]    Frequency of Communication with Friends and Family: More than three times a week    Frequency of Social Gatherings with Friends and Family: Once a week    Attends Religious Services: Never    Database Administrator or Organizations: No    Attends Banker Meetings: Never    Marital Status: Widowed  Intimate Partner Violence: Not At Risk (11/03/2023)   Humiliation,  Afraid, Rape, and Kick questionnaire    Fear of Current or Ex-Partner: No    Emotionally Abused: No    Physically Abused: No    Sexually Abused: No    FAMILY HISTORY: Family History  Problem Relation Age of Onset   Hypertension Father    Hypertension Mother     ALLERGIES:  He has no known allergies.  MEDICATIONS:  Current Outpatient Medications  Medication Sig Dispense Refill   apixaban  (ELIQUIS ) 5 MG TABS tablet Take 1 tablet (5 mg total) by mouth 2 (two) times daily. 60 tablet 0   atorvastatin  (LIPITOR) 40 MG tablet Take 1 tablet (40 mg total) by mouth daily. 90 tablet 4   Collagen-Vitamin C-Biotin (COLLAGEN PO) Take 1 capsule by mouth daily.     furosemide  (LASIX ) 40 MG tablet TAKE 1 TABLET DAILY 90 tablet 1   losartan  (COZAAR ) 50 MG tablet Take 1 tablet (50 mg total) by mouth daily. 30 tablet 0   metoprolol  succinate (TOPROL -XL) 100 MG 24 hr tablet TAKE 1 TABLET DAILY 90 tablet 4   Multiple Vitamins-Minerals (ONE-A-DAY MENS 50+) TABS Take 1 tablet by mouth daily.     omeprazole  (PRILOSEC  OTC) 20 MG tablet Take 20 mg by mouth daily. 30 minutes before a meal AS NEEDED     Potassium Chloride  ER 20 MEQ TBCR Take 20 mEq by mouth daily.     VITAMIN D PO Take 1 tablet by mouth daily.     No current facility-administered medications for this visit.    REVIEW OF SYSTEMS:    Review of Systems - Oncology  All other pertinent systems were reviewed and were negative except as mentioned above.  PHYSICAL EXAMINATION:  ECOG PERFORMANCE STATUS: 1 - Symptomatic but completely ambulatory  Vitals:   11/03/23 1533  BP: (!) 145/88  Pulse: 86  Resp: 20  Temp: 98 F (36.7 C)  SpO2: 100%   Filed Weights   11/03/23 1533  Weight: (!) 388 lb 3.2 oz (176.1 kg)    Physical Exam Constitutional:      General: He is not in acute distress.    Appearance: Normal appearance.  HENT:     Head: Normocephalic and atraumatic.  Eyes:     General: No scleral icterus.    Conjunctiva/sclera:  Conjunctivae normal.  Cardiovascular:     Rate and Rhythm: Normal rate and regular rhythm.     Heart sounds: Normal heart sounds.  Pulmonary:     Effort: Pulmonary effort is normal.     Breath sounds: Normal breath sounds.  Abdominal:     General: There is no distension.     Comments: Obese abdomen  Musculoskeletal:     Right lower leg: No  edema.     Left lower leg: No edema.  Lymphadenopathy:     Cervical: No cervical adenopathy.  Neurological:     General: No focal deficit present.     Mental Status: He is alert and oriented to person, place, and time.  Psychiatric:        Mood and Affect: Mood normal.        Behavior: Behavior normal.        Thought Content: Thought content normal.     LABORATORY DATA:   I have reviewed the data as listed.  No results found for any visits on 11/03/23.   RADIOGRAPHIC STUDIES:  I have personally reviewed the radiological images as listed and agree with the findings in the report.  ECHOCARDIOGRAM COMPLETE Result Date: 10/21/2023    ECHOCARDIOGRAM REPORT   Patient Name:   CORNELUIS ALLSTON Date of Exam: 10/21/2023 Medical Rec #:  [id number]      Height:       70.0 in Accession #:    [id number]     Weight:       389.5 lb Date of Birth:  1955/11/22      BSA:          2.771 m Patient Age:    67 years       BP:           163/99 mmHg Patient Gender: M              HR:           81 bpm. Exam Location:  Inpatient Procedure: 2D Echo, Cardiac Doppler, Color Doppler and Intracardiac            Opacification Agent Indications:    Afib  History:        Patient has no prior history of Echocardiogram examinations.                 TIA; Risk Factors:Hypertension.  Sonographer:    Jayson Gaskins Referring Phys: [phone] HAO MENG IMPRESSIONS  1. Left ventricular ejection fraction, by estimation, is 60 to 65%. The left ventricle has normal function. Left ventricular endocardial border not optimally defined to evaluate regional wall motion. not well visualized left ventricular  hypertrophy. Left ventricular diastolic parameters are indeterminate.  2. Right ventricular systolic function was not well visualized. The right ventricular size is not well visualized. Tricuspid regurgitation signal is inadequate for assessing PA pressure.  3. The mitral valve was not well visualized. Trivial mitral valve regurgitation. No evidence of mitral stenosis.  4. The aortic valve was not well visualized. Aortic valve regurgitation is not visualized. No aortic stenosis is present.  5. Aortic dilatation noted. There is mild dilatation of the ascending aorta, measuring 41 mm.  6. Technically difficult study, limited visualization FINDINGS  Left Ventricle: Left ventricular ejection fraction, by estimation, is 60 to 65%. The left ventricle has normal function. Left ventricular endocardial border not optimally defined to evaluate regional wall motion. The left ventricular internal cavity size was normal in size. Not well visualized left ventricular hypertrophy. Left ventricular diastolic parameters are indeterminate. Right Ventricle: The right ventricular size is not well visualized. Right vetricular wall thickness was not well visualized. Right ventricular systolic function was not well visualized. Tricuspid regurgitation signal is inadequate for assessing PA pressure. Left Atrium: Left atrial size was not well visualized. Right Atrium: Right atrial size was not well visualized. Pericardium: There is no evidence of pericardial effusion. Mitral Valve: The mitral valve was  not well visualized. Trivial mitral valve regurgitation. No evidence of mitral valve stenosis. Tricuspid Valve: The tricuspid valve is not well visualized. Tricuspid valve regurgitation is not demonstrated. No evidence of tricuspid stenosis. Aortic Valve: The aortic valve was not well visualized. Aortic valve regurgitation is not visualized. No aortic stenosis is present. Aortic valve mean gradient measures 4.3 mmHg. Aortic valve peak gradient  measures 10.2 mmHg. Pulmonic Valve: The pulmonic valve was not well visualized. Pulmonic valve regurgitation is not visualized. No evidence of pulmonic stenosis. Aorta: The aortic root was not well visualized and aortic dilatation noted. There is mild dilatation of the ascending aorta, measuring 41 mm. IAS/Shunts: The interatrial septum was not well visualized.   Diastology LV e' medial:    8.59 cm/s LV E/e' medial:  15.1 LV e' lateral:   9.46 cm/s LV E/e' lateral: 13.7  RIGHT VENTRICLE RV S prime:     13.40 cm/s LEFT ATRIUM              Index LA Vol (A2C):   80.1 ml  28.91 ml/m LA Vol (A4C):   189.0 ml 68.21 ml/m LA Biplane Vol: 125.0 ml 45.11 ml/m  AORTIC VALVE AV Vmax:           159.49 cm/s AV Vmean:          92.907 cm/s AV VTI:            0.262 m AV Peak Grad:      10.2 mmHg AV Mean Grad:      4.3 mmHg LVOT Vmax:         113.00 cm/s LVOT Vmean:        83.400 cm/s LVOT VTI:          0.198 m LVOT/AV VTI ratio: 0.76  AORTA Ao Asc diam: 41.00 cm MITRAL VALVE MV Area (PHT): 3.68 cm     SHUNTS MV Decel Time: 206 msec     Systemic VTI: 0.20 m MV E velocity: 130.00 cm/s Dorn Ross MD Electronically signed by Dorn Ross MD Signature Date/Time: 10/21/2023/2:45:38 PM    Final    DG Chest 2 View Result Date: 10/20/2023 CLINICAL DATA:  Shortness of breath, chest tightness EXAM: CHEST - 2 VIEW COMPARISON:  12/31/2016 FINDINGS: Linear scarring or atelectasis laterally at the left lung base as before. No new infiltrate or overt edema. Heart size and mediastinal contours are within normal limits. No effusion. Visualized bones unremarkable. IMPRESSION: No acute cardiopulmonary disease. Electronically Signed   By: JONETTA Faes M.D.   On: 10/20/2023 12:22    I spent a total of 55 minutes during this encounter with the patient including review of chart and various tests results, discussions about plan of care and coordination of care plan.  Orders Placed This Encounter  Procedures   Comprehensive metabolic panel     Standing Status:   Future    Expiration Date:   11/02/2024   CBC with Differential/Platelet    Standing Status:   Future    Expiration Date:   11/02/2024   Lactate dehydrogenase    Standing Status:   Future    Expiration Date:   11/02/2024   Sedimentation rate    Standing Status:   Future    Expiration Date:   11/02/2024   C-reactive protein    Standing Status:   Future    Expiration Date:   11/02/2024   BCR-ABL1 FISH    Standing Status:   Future    Expiration Date:  11/02/2024   Flow Cytometry, Peripheral Blood (Oncology)    Standing Status:   Future    Expiration Date:   11/02/2024   JAK2 V617F rfx CALR/MPL/E12-15    Standing Status:   Future    Expiration Date:   11/02/2024   ANA w/Reflex if Positive    Standing Status:   Future    Expiration Date:   11/02/2024   Rheumatoid factor    Standing Status:   Future    Expiration Date:   11/02/2024    Future Appointments  Date Time Provider Department Center  11/05/2023  9:00 AM Terra Fairy PARAS, PA-C [facility] None  11/05/2023 11:30 AM [facility] PHLEBOTOMIST [facility] None  11/17/2023  3:20 PM Alazia Crocket, Chinita, MD [facility] None  02/01/2024 11:15 AM [facility] PHLEBOTOMIST [facility] None  02/01/2024 11:40 AM Sapphire Tygart, Chinita, MD [facility] None     This document was completed utilizing speech recognition software. Grammatical errors, random word insertions, pronoun errors, and incomplete sentences are an occasional consequence of this system due to software limitations, ambient noise, and hardware issues. Any formal questions or concerns about the content, text or information contained within the body of this dictation should be directly addressed to the provider for clarification.

## 2023-11-05 ENCOUNTER — Inpatient Hospital Stay: Payer: Medicare Other

## 2023-11-05 ENCOUNTER — Ambulatory Visit (HOSPITAL_COMMUNITY)
Admission: RE | Admit: 2023-11-05 | Discharge: 2023-11-05 | Disposition: A | Discharge status: Home / Self Care | Payer: Medicare Other | Source: Ambulatory Visit | Attending: Internal Medicine | Admitting: Internal Medicine

## 2023-11-05 VITALS — BP 132/94 | HR 82 | Ht 70.0 in | Wt 391.2 lb

## 2023-11-05 DIAGNOSIS — Z7901 Long term (current) use of anticoagulants: Secondary | ICD-10-CM | POA: Insufficient documentation

## 2023-11-05 DIAGNOSIS — D6869 Other thrombophilia: Secondary | ICD-10-CM | POA: Diagnosis not present

## 2023-11-05 DIAGNOSIS — I4819 Other persistent atrial fibrillation: Secondary | ICD-10-CM | POA: Insufficient documentation

## 2023-11-05 DIAGNOSIS — D72829 Elevated white blood cell count, unspecified: Secondary | ICD-10-CM

## 2023-11-05 LAB — COMPREHENSIVE METABOLIC PANEL
ALT: 19 U/L (ref 0–44)
AST: 15 U/L (ref 15–41)
Albumin: 4.1 g/dL (ref 3.5–5.0)
Alkaline Phosphatase: 76 U/L (ref 38–126)
Anion gap: 10 (ref 5–15)
BUN: 26 mg/dL — ABNORMAL HIGH (ref 8–23)
CO2: 26 mmol/L (ref 22–32)
Calcium: 9.4 mg/dL (ref 8.9–10.3)
Chloride: 102 mmol/L (ref 98–111)
Creatinine, Ser: 0.94 mg/dL (ref 0.61–1.24)
GFR, Estimated: >60 mL/min (ref >60)
Glucose, Bld: 105 mg/dL — ABNORMAL HIGH (ref 70–99)
Potassium: 4.2 mmol/L (ref 3.5–5.1)
Sodium: 138 mmol/L (ref 135–145)
Total Bilirubin: 0.8 mg/dL (ref 0.0–1.2)
Total Protein: 6.7 g/dL (ref 6.5–8.1)

## 2023-11-05 LAB — CBC WITH DIFFERENTIAL/PLATELET
Abs Immature Granulocytes: 0.07 10*3/uL (ref 0.00–0.07)
Basophils Absolute: 0.1 10*3/uL (ref 0.0–0.1)
Basophils Relative: 0 %
Eosinophils Absolute: 0.2 10*3/uL (ref 0.0–0.5)
Eosinophils Relative: 1 %
HCT: 39.8 % (ref 39.0–52.0)
Hemoglobin: 13.8 g/dL (ref 13.0–17.0)
Immature Granulocytes: 1 %
Lymphocytes Relative: 25 %
Lymphs Abs: 2.9 10*3/uL (ref 0.7–4.0)
MCH: 30.5 pg (ref 26.0–34.0)
MCHC: 34.7 g/dL (ref 30.0–36.0)
MCV: 87.9 fL (ref 80.0–100.0)
Monocytes Absolute: 1 10*3/uL (ref 0.1–1.0)
Monocytes Relative: 8 %
Neutro Abs: 7.4 10*3/uL (ref 1.7–7.7)
Neutrophils Relative %: 65 %
Platelets: 313 10*3/uL (ref 150–400)
RBC: 4.53 MIL/uL (ref 4.22–5.81)
RDW: 15.6 % — ABNORMAL HIGH (ref 11.5–15.5)
WBC: 11.5 10*3/uL — ABNORMAL HIGH (ref 4.0–10.5)
nRBC: 0 % (ref 0.0–0.2)

## 2023-11-05 LAB — SEDIMENTATION RATE: Sed Rate: 25 mm/h — ABNORMAL HIGH (ref 0–16)

## 2023-11-05 LAB — C-REACTIVE PROTEIN: CRP: <0.5 mg/dL (ref <1.0)

## 2023-11-05 LAB — LACTATE DEHYDROGENASE: LDH: 143 U/L (ref 98–192)

## 2023-11-05 MED ORDER — APIXABAN 5 MG PO TABS: 5.0000 mg | ORAL_TABLET | Freq: Two times a day (BID) | ORAL | 1 refills | Status: DC

## 2023-11-05 NOTE — H&P (View-Only) (Signed)
Primary Care Physician: Galvin Proffer, MD Primary Cardiologist: None Electrophysiologist: None     Referring Physician: Dr. Ramon Dredge is a 68 y.o. male with a history of HTN, TIA in 2019, fatty liver disease, obesity, dilated aortic root, OSA not on CPAP, and paroxysmal atrial fibrillation who presents for consultation in the [facility]. Hospital admission 10/20/23-10/21/23 for chest pain found have flu type A and new onset Afib (suspect persistent). Patient is on Eliquis 5 mg BID for a CHADS2VASC score of 4.  On evaluation today, he is currently in Afib. Patient drinks decaf coffee, does not drink any alcohol, and states he does not snore. He admits to me since hospital discharge he has been cutting his Eliquis in half and taking half tablet twice daily since day of discharge.   Today, he denies symptoms of palpitations, chest pain, shortness of breath, orthopnea, PND, lower extremity edema, dizziness, presyncope, syncope, snoring, daytime somnolence, bleeding, or neurologic sequela. The patient is tolerating medications without difficulties and is otherwise without complaint today.    Atrial Fibrillation Risk Factors:  he does have symptoms or diagnosis of sleep apnea. he is compliant with CPAP therapy.  he has a BMI of Body mass index is 56.13 kg/m.Marland Kitchen Filed Weights   11/05/23 0851  Weight: (!) 177.4 kg    Current Outpatient Medications  Medication Sig Dispense Refill   atorvastatin (LIPITOR) 40 MG tablet Take 1 tablet (40 mg total) by mouth daily. 90 tablet 4   Collagen-Vitamin C-Biotin (COLLAGEN PO) Take 3 capsules by mouth daily. 5000 units total     furosemide (LASIX) 40 MG tablet TAKE 1 TABLET DAILY 90 tablet 1   losartan (COZAAR) 50 MG tablet Take 1 tablet (50 mg total) by mouth daily. 30 tablet 0   metoprolol succinate (TOPROL-XL) 100 MG 24 hr tablet TAKE 1 TABLET DAILY 90 tablet 4   Multiple Vitamins-Minerals (ONE-A-DAY MENS  50+) TABS Take 1 tablet by mouth daily.     omeprazole (PRILOSEC OTC) 20 MG tablet Take 20 mg by mouth daily. 30 minutes before a meal AS NEEDED     Potassium Chloride ER 20 MEQ TBCR Take 20 mEq by mouth daily.     VITAMIN D PO Take 1 tablet by mouth daily. 5000 units daily     apixaban (ELIQUIS) 5 MG TABS tablet Take 1 tablet (5 mg total) by mouth 2 (two) times daily. 180 tablet 1   No current facility-administered medications for this encounter.    Atrial Fibrillation Management history:  Previous antiarrhythmic drugs: none Previous cardioversions: none Previous ablations: none Anticoagulation history: Eliquis   ROS- All systems are reviewed and negative except as per the HPI above.  Physical Exam: BP (!) 132/94   Pulse 82   Ht 5\' 10"  (1.778 m)   Wt (!) 177.4 kg   BMI 56.13 kg/m   GEN: Well nourished, well developed in no acute distress. Obese habitus NECK: No JVD; No carotid bruits CARDIAC: Irregularly irregular rate and rhythm, no murmurs, rubs, gallops RESPIRATORY:  Clear to auscultation without rales, wheezing or rhonchi  ABDOMEN: Soft, non-tender, non-distended EXTREMITIES:  No edema; No deformity   EKG today demonstrates  Vent. rate 82 BPM PR interval * ms QRS duration 130 ms QT/QTcB 402/469 ms P-R-T axes * 73 12 Atrial fibrillation Right bundle branch block Abnormal ECG When compared with ECG of 20-Oct-2023 10:00, No significant change was found  Echo 10/21/23 demonstrated  1. Left ventricular ejection fraction, by estimation, is 60 to 65%. The  left ventricle has normal function. Left ventricular endocardial border  not optimally defined to evaluate regional wall motion. not well  visualized left ventricular hypertrophy.  Left ventricular diastolic parameters are indeterminate.   2. Right ventricular systolic function was not well visualized. The right  ventricular size is not well visualized. Tricuspid regurgitation signal is  inadequate for assessing PA  pressure.   3. The mitral valve was not well visualized. Trivial mitral valve  regurgitation. No evidence of mitral stenosis.   4. The aortic valve was not well visualized. Aortic valve regurgitation  is not visualized. No aortic stenosis is present.   5. Aortic dilatation noted. There is mild dilatation of the ascending  aorta, measuring 41 mm.   6. Technically difficult study, limited visualization    ASSESSMENT & PLAN CHA2DS2-VASc Score = 4  The patient's score is based upon: CHF History: 0 HTN History: 1 Diabetes History: 0 Stroke History: 2 Vascular Disease History: 0 Age Score: 1 Gender Score: 0       ASSESSMENT AND PLAN: Persistent Atrial Fibrillation (ICD10:  I48.19) The patient's CHA2DS2-VASc score is 4, indicating a 4.8% annual risk of stroke.    He is in Afib. Education provided about Afib. Discussion about medication treatments going forward if indicated. We discussed treatment option for initial attempt to try to convert to NSR. After discussion, he agrees to proceed with cardioversion. Labs obtained today. Rhythm monitoring device recommended. Instruction on beginning Eliquis 5 mg (1 whole tablet) twice daily tomorrow so cardioversion will be scheduled at least 3 weeks from tomorrow 1/17.   Informed Consent   Shared Decision Making/Informed Consent The risks (stroke, cardiac arrhythmias rarely resulting in the need for a temporary or permanent pacemaker, skin irritation or burns and complications associated with conscious sedation including aspiration, arrhythmia, respiratory failure and death), benefits (restoration of normal sinus rhythm) and alternatives of a direct current cardioversion were explained in detail to Mr. Shammas and he agrees to proceed.       We discussed sleep study for sleep apnea - patient initially denied having sleep apnea and stating he does not snore. Review of records show diagnosis of severe sleep apnea in 2018. Patient denies having a sleep  study done. I recommended to patient he be referred for repeat sleep study. He will consider this and think it over.   Secondary Hypercoagulable State (ICD10:  D68.69) The patient is at significant risk for stroke/thromboembolism based upon his CHA2DS2-VASc Score of 4.  Continue Apixaban (Eliquis).  He was incorrectly cutting pill in half taking one-half tablet twice daily. Discussion on necessity for proper anticoagulation to prevent stroke related to Afib. After discussion, patient voiced understanding and will begin taking Eliquis 5 mg BID without cutting pill and without interruption.  In reviewing patient's CHADSVASC score, he believes diagnosis of TIA in 2019 was incorrect and he did not have one.    Follow up 2 weeks after DCCV.    Lake Bells, PA-C  [facility] [address] [place], [place] [address] [phone]

## 2023-11-05 NOTE — Progress Notes (Addendum)
Primary Care Physician: Galvin Proffer, MD Primary Cardiologist: None Electrophysiologist: None     Referring Physician: Dr. Ramon Dredge is a 68 y.o. male with a history of HTN, TIA in 2019, fatty liver disease, obesity, dilated aortic root, OSA not on CPAP, and paroxysmal atrial fibrillation who presents for consultation in the [facility]. Hospital admission 10/20/23-10/21/23 for chest pain found have flu type A and new onset Afib (suspect persistent). Patient is on Eliquis 5 mg BID for a CHADS2VASC score of 4.  On evaluation today, he is currently in Afib. Patient drinks decaf coffee, does not drink any alcohol, and states he does not snore. He admits to me since hospital discharge he has been cutting his Eliquis in half and taking half tablet twice daily since day of discharge.   Today, he denies symptoms of palpitations, chest pain, shortness of breath, orthopnea, PND, lower extremity edema, dizziness, presyncope, syncope, snoring, daytime somnolence, bleeding, or neurologic sequela. The patient is tolerating medications without difficulties and is otherwise without complaint today.    Atrial Fibrillation Risk Factors:  he does have symptoms or diagnosis of sleep apnea. he is compliant with CPAP therapy.  he has a BMI of Body mass index is 56.13 kg/m.Marland Kitchen Filed Weights   11/05/23 0851  Weight: (!) 177.4 kg    Current Outpatient Medications  Medication Sig Dispense Refill   atorvastatin (LIPITOR) 40 MG tablet Take 1 tablet (40 mg total) by mouth daily. 90 tablet 4   Collagen-Vitamin C-Biotin (COLLAGEN PO) Take 3 capsules by mouth daily. 5000 units total     furosemide (LASIX) 40 MG tablet TAKE 1 TABLET DAILY 90 tablet 1   losartan (COZAAR) 50 MG tablet Take 1 tablet (50 mg total) by mouth daily. 30 tablet 0   metoprolol succinate (TOPROL-XL) 100 MG 24 hr tablet TAKE 1 TABLET DAILY 90 tablet 4   Multiple Vitamins-Minerals (ONE-A-DAY MENS  50+) TABS Take 1 tablet by mouth daily.     omeprazole (PRILOSEC OTC) 20 MG tablet Take 20 mg by mouth daily. 30 minutes before a meal AS NEEDED     Potassium Chloride ER 20 MEQ TBCR Take 20 mEq by mouth daily.     VITAMIN D PO Take 1 tablet by mouth daily. 5000 units daily     apixaban (ELIQUIS) 5 MG TABS tablet Take 1 tablet (5 mg total) by mouth 2 (two) times daily. 180 tablet 1   No current facility-administered medications for this encounter.    Atrial Fibrillation Management history:  Previous antiarrhythmic drugs: none Previous cardioversions: none Previous ablations: none Anticoagulation history: Eliquis   ROS- All systems are reviewed and negative except as per the HPI above.  Physical Exam: BP (!) 132/94   Pulse 82   Ht 5\' 10"  (1.778 m)   Wt (!) 177.4 kg   BMI 56.13 kg/m   GEN: Well nourished, well developed in no acute distress. Obese habitus NECK: No JVD; No carotid bruits CARDIAC: Irregularly irregular rate and rhythm, no murmurs, rubs, gallops RESPIRATORY:  Clear to auscultation without rales, wheezing or rhonchi  ABDOMEN: Soft, non-tender, non-distended EXTREMITIES:  No edema; No deformity   EKG today demonstrates  Vent. rate 82 BPM PR interval * ms QRS duration 130 ms QT/QTcB 402/469 ms P-R-T axes * 73 12 Atrial fibrillation Right bundle branch block Abnormal ECG When compared with ECG of 20-Oct-2023 10:00, No significant change was found  Echo 10/21/23 demonstrated  1. Left ventricular ejection fraction, by estimation, is 60 to 65%. The  left ventricle has normal function. Left ventricular endocardial border  not optimally defined to evaluate regional wall motion. not well  visualized left ventricular hypertrophy.  Left ventricular diastolic parameters are indeterminate.   2. Right ventricular systolic function was not well visualized. The right  ventricular size is not well visualized. Tricuspid regurgitation signal is  inadequate for assessing PA  pressure.   3. The mitral valve was not well visualized. Trivial mitral valve  regurgitation. No evidence of mitral stenosis.   4. The aortic valve was not well visualized. Aortic valve regurgitation  is not visualized. No aortic stenosis is present.   5. Aortic dilatation noted. There is mild dilatation of the ascending  aorta, measuring 41 mm.   6. Technically difficult study, limited visualization    ASSESSMENT & PLAN CHA2DS2-VASc Score = 4  The patient's score is based upon: CHF History: 0 HTN History: 1 Diabetes History: 0 Stroke History: 2 Vascular Disease History: 0 Age Score: 1 Gender Score: 0       ASSESSMENT AND PLAN: Persistent Atrial Fibrillation (ICD10:  I48.19) The patient's CHA2DS2-VASc score is 4, indicating a 4.8% annual risk of stroke.    He is in Afib. Education provided about Afib. Discussion about medication treatments going forward if indicated. We discussed treatment option for initial attempt to try to convert to NSR. After discussion, he agrees to proceed with cardioversion. Labs obtained today. Rhythm monitoring device recommended. Instruction on beginning Eliquis 5 mg (1 whole tablet) twice daily tomorrow so cardioversion will be scheduled at least 3 weeks from tomorrow 1/17.   Informed Consent   Shared Decision Making/Informed Consent The risks (stroke, cardiac arrhythmias rarely resulting in the need for a temporary or permanent pacemaker, skin irritation or burns and complications associated with conscious sedation including aspiration, arrhythmia, respiratory failure and death), benefits (restoration of normal sinus rhythm) and alternatives of a direct current cardioversion were explained in detail to Mr. Dankenbring and he agrees to proceed.       We discussed sleep study for sleep apnea - patient initially denied having sleep apnea and stating he does not snore. Review of records show diagnosis of severe sleep apnea in 2018. Patient denies having a sleep  study done. I recommended to patient he be referred for repeat sleep study. He will consider this and think it over.   Secondary Hypercoagulable State (ICD10:  D68.69) The patient is at significant risk for stroke/thromboembolism based upon his CHA2DS2-VASc Score of 4.  Continue Apixaban (Eliquis).  He was incorrectly cutting pill in half taking one-half tablet twice daily. Discussion on necessity for proper anticoagulation to prevent stroke related to Afib. After discussion, patient voiced understanding and will begin taking Eliquis 5 mg BID without cutting pill and without interruption.  In reviewing patient's CHADSVASC score, he believes diagnosis of TIA in 2019 was incorrect and he did not have one.    Follow up 2 weeks after DCCV.    Lake Bells, PA-C  Af[facility] [address] [place], [place] [address] [phone]

## 2023-11-05 NOTE — Patient Instructions (Signed)
Cardioversion scheduled for: Friday, February 7th   - Arrive at the [facility] and go to admitting at 8:00AM   - Do not eat or drink anything after midnight the night prior to your procedure.   - Take all your morning medication (except diabetic medications) with a sip of water prior to arrival.  - You will not be able to drive home after your procedure.    - Do NOT miss any doses of your blood thinner - if you should miss a dose please notify our office immediately.   - If you feel as if you go back into normal rhythm prior to scheduled cardioversion, please notify our office immediately.   If your procedure is canceled in the cardioversion suite you will be charged a cancellation fee.    Beginning in 2025, the prescription drug law requires all Medicare prescription drug plans (Medicare Part D plans)--including both standalone Medicare prescription drug plans and Medicare Advantage plans with prescription drug coverage--to offer Part D enrollees the option to pay out-of-pocket prescription drug costs in the form of monthly payments instead of all at once at the pharmacy. This program, called the Medicare Prescription Payment Plan, will be helpful for people with high cost sharing earlier in the plan year by spreading out those expenses over the course of the plan year.  What is the Medicare Prescription Payment Plan? The Medicare Prescription Payment Plan is a new payment option created under the Inflation Reduction Act that requires Part D plan sponsors to provide their enrollees with the option to pay out-of-pocket prescription drug costs in the form of monthly payments over the course of the plan year instead of all at once to the pharmacy. The program begins on October 21, 2023.  Program participants will pay $0 to the pharmacy for covered Part D drugs, and Part D plan sponsors will then bill program participants monthly for any cost sharing they incur while in the program.  Pharmacies will be paid in full by the Part D sponsor in accordance with Part D prompt payment requirements  If there are issues with costs of your medications with new medicare changes ---call your DRUG INSURANCE to discuss payment plan to reduce your monthly costs.

## 2023-11-06 LAB — SURGICAL PATHOLOGY

## 2023-11-06 LAB — ANA W/REFLEX IF POSITIVE: Anti Nuclear Antibody (ANA): NEGATIVE

## 2023-11-07 LAB — RHEUMATOID FACTOR: Rheumatoid fact SerPl-aCnc: <10 [IU]/mL (ref <14.0)

## 2023-11-11 LAB — BCR-ABL1 FISH
Cells Analyzed: 200
Cells Counted: 200

## 2023-11-11 LAB — JAK2 V617F RFX CALR/MPL/E12-15

## 2023-11-11 LAB — CALR +MPL + E12-E15  (REFLEX)

## 2023-11-11 LAB — FLOW CYTOMETRY

## 2023-11-17 ENCOUNTER — Encounter: Payer: Self-pay | Admitting: Oncology

## 2023-11-17 ENCOUNTER — Inpatient Hospital Stay (HOSPITAL_BASED_OUTPATIENT_CLINIC_OR_DEPARTMENT_OTHER): Payer: Medicare Other | Admitting: Oncology

## 2023-11-17 DIAGNOSIS — Z0000 Encounter for general adult medical examination without abnormal findings: Secondary | ICD-10-CM

## 2023-11-17 DIAGNOSIS — D72829 Elevated white blood cell count, unspecified: Secondary | ICD-10-CM

## 2023-11-17 DIAGNOSIS — I4891 Unspecified atrial fibrillation: Secondary | ICD-10-CM | POA: Diagnosis not present

## 2023-11-17 NOTE — Progress Notes (Signed)
[facility]  HEMATOLOGY-ONCOLOGY ELECTRONIC VISIT PROGRESS NOTE  PATIENT NAME: Ryan [name]   MR#: [id number] DOB: 01-14-1956  DATE OF SERVICE: 11/17/2023  Patient Care Team: Galvin Proffer, MD as PCP - General (Internal Medicine)  I connected with the patient via telephone conference and verified that I am speaking with the correct person using two identifiers. The patient's location is at home and I am providing care from the [facility].  I discussed the limitations, risks, security and privacy concerns of performing an evaluation and management service by e-visits and the availability of in person appointments.  I also discussed with the patient that there may be a patient responsible charge related to this service. The patient expressed understanding and agreed to proceed.   ASSESSMENT & PLAN:   Ryan [name] is a 67 y.o. gentleman with a past medical history of hypertension, dyslipidemia, TIA, hepatic steatosis, arthritis, past nicotine dependence, was referred to our service in January 2025 for evaluation of leukocytosis.  Grossly negative hematological workup except for elevated sed rate.  Leukocytosis is reactionary to inflammation.  Leukocytosis Chronic leukocytosis since at least 2010 with WBC counts ranging from 13,000 to 15,000. Given the long-standing nature and stability, a bone marrow disorder is unlikely. Chronic inflammation from arthritis is a possible contributing factor.   On his initial consultation with [place] on 11/03/2023, CBC showed white count of 11,500 with normal differential.  Hemoglobin 13.8, platelet count normal at 313,000.  CMP was grossly unremarkable.  Sed rate was elevated at 25 mm/h.  CRP, LDH, were within normal limits.  BCR/ABL 1 negative.  JAK2 mutation, CALR, MPL mutations were all negative.  Flow cytometry of peripheral blood was unremarkable. ANA and Rheumatoid factor were negative.    Chronic leukocytosis seems to be reactive to  inflammation.  No acute bone marrow pathology noted on workup.  He was provided reassurance.  Will continue surveillance alone.  Plan to see him again in 3 months for follow-up.  Atrial fibrillation (HCC) Recent episode likely triggered by a flu infection. Currently on Eliquis and losartan. Echocardiogram and EKG have been normal. Discussed that the recent AFib episode may be a one-time occurrence related to the flu. Emphasized the importance of continuing Eliquis to prevent clot formation and losartan for blood pressure management. Patient hopes to discontinue Eliquis if AFib is determined to be a one-time event. - Continue Eliquis 5 mg twice daily - Continue losartan - Follow-up with cardiologist for further evaluation and management  Healthcare maintenance Significant weight loss and actively working on weight management. Quit smoking and drinking several years ago. Discussed the importance of maintaining a healthy diet and regular exercise to support overall health and manage arthritis symptoms. - Encourage continued weight management and regular exercise - Reinforce the importance of maintaining a healthy diet and lifestyle  I discussed the assessment and treatment plan with the patient. The patient was provided an opportunity to ask questions and all were answered. The patient agreed with the plan and demonstrated an understanding of the instructions. The patient was advised to call back or seek an in-person evaluation if the symptoms worsen or if the condition fails to improve as anticipated.    I spent 12 minutes over the phone with the patient reviewing test results, discuss management and coordination/planning of care.  Meryl Crutch, MD 11/17/2023 3:21 PM [facility] A DEPT OF [place]. [facility] [address] [place]  [place] [address] Dept: [phone] Dept Fax: [phone]   INTERVAL HISTORY:  Please see above  for problem oriented charting.  The purpose of today's discussion is to explain recent lab results and to formulate plan of care.  He reports no new issues since his last visit. The patient confirms living with a high level of pain from his arthritis. The patient has undergone testing for potential bone marrow disorders or mutations, including leukemia, all of which have returned negative results.  SUMMARY OF HEMATOLOGY HISTORY:  On 09/09/2023, iron studies, B12, folate were all within normal limits.  CMP grossly unremarkable.  White count was elevated at 13,700 with ANC of 1600, normal differential.  Hemoglobin 15.6, platelet count 267,000.  Previously white count was elevated at 15,000 in August 2024.  Given persistent leukocytosis, referral was sent to [place] for further evaluation.   On review of records, he has had chronic, intermittent leukocytosis dating back to August 2010 with white count ranging from 10,000-15,000.   The patient first became aware of this issue in 2019, but the abnormality has been present since at least 2010. The patient has been monitoring the count through an online portal and notes that it fluctuates slightly but remains above the upper limit of normal. The patient denies any recent infections but does report a history of arthritis, which is severe and affects both shoulders and knees. The arthritis has been treated with steroid injections, which provide temporary relief. The patient also reports a recent diagnosis of atrial fibrillation, which occurred during a recent hospitalization for a flu-like illness and a GI virus. The patient is currently on Eliquis and Losartan for this condition.  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 Given the long-standing nature and stability, a bone marrow disorder is unlikely. Chronic inflammation from arthritis is a possible contributing factor.   On his initial consultation with [place] on  11/03/2023, CBC showed white count of 11,500 with normal differential.  Hemoglobin 13.8, platelet count normal at 313,000.  CMP was grossly unremarkable.  Sed rate was elevated at 25 mm/h.  CRP, LDH, were within normal limits.  BCR/ABL 1 negative.  JAK2 mutation, CALR, MPL mutations were all negative.  Flow cytometry of peripheral blood was unremarkable. ANA and Rheumatoid factor were negative.    Chronic leukocytosis seems to be reactive to inflammation.  No acute bone marrow pathology noted on workup.  He was provided reassurance.  Will continue surveillance alone.  Plan to see him again in 3 months for follow-up.  REVIEW OF SYSTEMS:    Review of Systems - Oncology  All other pertinent systems were reviewed with the patient and are negative.  I have reviewed the past medical history, past surgical history, social history and family history with the patient and they are unchanged from previous note.  ALLERGIES:  He has no known allergies.  MEDICATIONS:  Current Outpatient Medications  Medication Sig Dispense Refill   apixaban (ELIQUIS) 5 MG TABS tablet Take 1 tablet (5 mg total) by mouth 2 (two) times daily. 180 tablet 1   atorvastatin (LIPITOR) 40 MG tablet Take 1 tablet (40 mg total) by mouth daily. 90 tablet 4   Collagen-Vitamin C-Biotin (COLLAGEN PO) Take 3 capsules by mouth daily. 5000 units total     furosemide (LASIX) 40 MG tablet TAKE 1 TABLET DAILY 90 tablet 1   losartan (COZAAR) 50 MG tablet Take 1 tablet (50 mg total) by mouth daily. 30 tablet 0  metoprolol succinate (TOPROL-XL) 100 MG 24 hr tablet TAKE 1 TABLET DAILY 90 tablet 4   Multiple Vitamins-Minerals (ONE-A-DAY MENS 50+) TABS Take 1 tablet by mouth daily.     omeprazole (PRILOSEC OTC) 20 MG tablet Take 20 mg by mouth daily. 30 minutes before a meal AS NEEDED     Potassium Chloride ER 20 MEQ TBCR Take 20 mEq by mouth daily.     VITAMIN D PO Take 1 tablet by mouth daily. 5000 units daily     No current  facility-administered medications for this visit.    PHYSICAL EXAMINATION: ECOG PERFORMANCE STATUS: 1 - Symptomatic but completely ambulatory  LABORATORY DATA:   I have reviewed the data as listed.  Recent Results (from the past 2160 hours)  CBC with Differential     Status: Abnormal   Collection Time: 10/20/23 10:06 AM  Result Value Ref Range   WBC 8.9 4.0 - 10.5 K/uL   RBC 4.89 4.22 - 5.81 MIL/uL   Hemoglobin 14.7 13.0 - 17.0 g/dL   HCT 60.4 54.0 - 98.1 %   MCV 88.3 80.0 - 100.0 fL   MCH 30.1 26.0 - 34.0 pg   MCHC 34.0 30.0 - 36.0 g/dL   RDW 19.1 (H) 47.8 - 29.5 %   Platelets 229 150 - 400 K/uL   nRBC 0.0 0.0 - 0.2 %   Neutrophils Relative % 64 %   Neutro Abs 5.8 1.7 - 7.7 K/uL   Lymphocytes Relative 22 %   Lymphs Abs 1.9 0.7 - 4.0 K/uL   Monocytes Relative 11 %   Monocytes Absolute 0.9 0.1 - 1.0 K/uL   Eosinophils Relative 2 %   Eosinophils Absolute 0.1 0.0 - 0.5 K/uL   Basophils Relative 0 %   Basophils Absolute 0.0 0.0 - 0.1 K/uL   Immature Granulocytes 1 %   Abs Immature Granulocytes 0.06 0.00 - 0.07 K/uL    Comment: Performed at [facility], [address], [place], [place] [address]  Comprehensive metabolic panel     Status: Abnormal   Collection Time: 10/20/23 10:06 AM  Result Value Ref Range   Sodium 138 135 - 145 mmol/L   Potassium 3.8 3.5 - 5.1 mmol/L   Chloride 100 98 - 111 mmol/L   CO2 24 22 - 32 mmol/L   Glucose, Bld 117 (H) 70 - 99 mg/dL    Comment: Glucose reference range applies only to samples taken after fasting for at least 8 hours.   BUN 12 8 - 23 mg/dL   Creatinine, Ser 8.65 0.61 - 1.24 mg/dL   Calcium 9.2 8.9 - 78.4 mg/dL   Total Protein 6.7 6.5 - 8.1 g/dL   Albumin 3.5 3.5 - 5.0 g/dL   AST 31 15 - 41 U/L   ALT 25 0 - 44 U/L   Alkaline Phosphatase 92 38 - 126 U/L   Total Bilirubin 0.6 0.0 - 1.2 mg/dL   GFR, Estimated >69 >62 mL/min    Comment: (NOTE) Calculated using the CKD-EPI Creatinine Equation (2021)    Anion gap 14 5 - 15     Comment: Performed at [facility], [address] [place], [place] [address]  Resp panel by RT-PCR (RSV, Flu A&B, Covid) Anterior Nasal Swab     Status: Abnormal   Collection Time: 10/20/23 10:06 AM   Specimen: Anterior Nasal Swab  Result Value Ref Range   SARS Coronavirus 2 by RT PCR NEGATIVE NEGATIVE   Influenza A by PCR POSITIVE (  A) NEGATIVE   Influenza B by PCR NEGATIVE NEGATIVE    Comment: (NOTE) The Xpert Xpress SARS-CoV-2/FLU/RSV plus assay is intended as an aid in the diagnosis of influenza from Nasopharyngeal swab specimens and should not be used as a sole basis for treatment. Nasal washings and aspirates are unacceptable for Xpert Xpress SARS-CoV-2/FLU/RSV testing.  Fact Sheet for Patients: BloggerCourse.com  Fact Sheet for Healthcare Providers: SeriousBroker.it  This test is not yet approved or cleared by the [place] FDA and has been authorized for detection and/or diagnosis of SARS-CoV-2 by FDA under an Emergency Use Authorization (EUA). This EUA will remain in effect (meaning this test can be used) for the duration of the COVID-19 declaration under Section 564(b)(1) of the Act, 21 U.S.C. section 360bbb-3(b)(1), unless the authorization is terminated or revoked.     Resp Syncytial Virus by PCR NEGATIVE NEGATIVE    Comment: (NOTE) Fact Sheet for Patients: BloggerCourse.com  Fact Sheet for Healthcare Providers: SeriousBroker.it  This test is not yet approved or cleared by the [place] FDA and has been authorized for detection and/or diagnosis of SARS-CoV-2 by FDA under an Emergency Use Authorization (EUA). This EUA will remain in effect (meaning this test can be used) for the duration of the COVID-19 declaration under Section 564(b)(1) of the Act, 21 U.S.C. section 360bbb-3(b)(1), unless the authorization is terminated or revoked.  Performed  at [facility], [address], [place], [place] [address]   Troponin I (High Sensitivity)     Status: Abnormal   Collection Time: 10/20/23 10:06 AM  Result Value Ref Range   Troponin I (High Sensitivity) 135 (HH) <18 ng/L    Comment: ATTEMPTED TO CALL @ 1142 10/20/23 BY SEKDAHL CRITICAL RESULT CALLED TO, READ BACK BY AND VERIFIED WITH C. BLACK, RN @ 1152 10/20/23 BY SEKDAHL (NOTE) Elevated high sensitivity troponin I (hsTnI) values and significant  changes across serial measurements may suggest ACS but many other  chronic and acute conditions are known to elevate hsTnI results.  Refer to the "Links" section for chest pain algorithms and additional  guidance. Performed at [facility], [address] [place], [place] [address]   TSH     Status: Abnormal   Collection Time: 10/20/23 10:06 AM  Result Value Ref Range   TSH 4.689 (H) 0.350 - 4.500 uIU/mL    Comment: Performed by a 3rd Generation assay with a functional sensitivity of <=0.01 uIU/mL. Performed at [facility], [address], [place], [place] [address]   T4, free     Status: None   Collection Time: 10/20/23 10:06 AM  Result Value Ref Range   Free T4 1.02 0.61 - 1.12 ng/dL    Comment: (NOTE) Biotin ingestion may interfere with free T4 tests. If the results are inconsistent with the TSH level, previous test results, or the clinical presentation, then consider biotin interference. If needed, order repeat testing after stopping biotin. Performed at [facility], [address] [place], [place] [address]   Troponin I (High Sensitivity)     Status: Abnormal   Collection Time: 10/20/23  1:12 PM  Result Value Ref Range   Troponin I (High Sensitivity) 156 (HH) <18 ng/L    Comment: CRITICAL VALUE NOTED. VALUE IS CONSISTENT WITH PREVIOUSLY REPORTED/CALLED VALUE (NOTE) Elevated high sensitivity troponin I (hsTnI) values and significant  changes across serial measurements may suggest ACS but many  other  chronic and acute conditions are known to elevate hsTnI results.  Refer to the "Links" section for chest pain algorithms and additional  guidance. Performed at [facility], [address], [place], [place] [address]   Troponin I (High Sensitivity)     Status: Abnormal   Collection Time: 10/20/23  6:46 PM  Result Value Ref Range   Troponin I (High Sensitivity) 111 (HH) <18 ng/L    Comment: CRITICAL VALUE NOTED. VALUE IS CONSISTENT WITH PREVIOUSLY REPORTED/CALLED VALUE (NOTE) Elevated high sensitivity troponin I (hsTnI) values and significant  changes across serial measurements may suggest ACS but many other  chronic and acute conditions are known to elevate hsTnI results.  Refer to the "Links" section for chest pain algorithms and additional  guidance. Performed at [facility], [address] [place], [place] [address]   MRSA Next Gen by PCR, Nasal     Status: None   Collection Time: 10/20/23  8:12 PM   Specimen: Nasal Mucosa; Nasal Swab  Result Value Ref Range   MRSA by PCR Next Gen NOT DETECTED NOT DETECTED    Comment: (NOTE) The GeneXpert MRSA Assay (FDA approved for NASAL specimens only), is one component of a comprehensive MRSA colonization surveillance program. It is not intended to diagnose MRSA infection nor to guide or monitor treatment for MRSA infections. Test performance is not FDA approved in patients less than 24 years old. Performed at [facility], [address], [place], [place] [address]   HIV Antibody (routine testing w rflx)     Status: None   Collection Time: 10/20/23  8:27 PM  Result Value Ref Range   HIV Screen 4th Generation wRfx Non Reactive Non Reactive    Comment: Performed at [facility], [address] [place], [place] [address]  Troponin I (High Sensitivity)     Status: Abnormal   Collection Time: 10/20/23  8:27 PM  Result Value Ref Range   Troponin I (High Sensitivity) 108 (HH) <18 ng/L    Comment: CRITICAL  VALUE NOTED. VALUE IS CONSISTENT WITH PREVIOUSLY REPORTED/CALLED VALUE (NOTE) Elevated high sensitivity troponin I (hsTnI) values and significant  changes across serial measurements may suggest ACS but many other  chronic and acute conditions are known to elevate hsTnI results.  Refer to the "Links" section for chest pain algorithms and additional  guidance. Performed at [facility], [address], [place], [place] [address]   APTT     Status: None   Collection Time: 10/21/23  2:28 AM  Result Value Ref Range   aPTT 36 24 - 36 seconds    Comment: Performed at [facility], [address], [place], [place] [address]  Comprehensive metabolic panel     Status: Abnormal   Collection Time: 10/21/23  2:28 AM  Result Value Ref Range   Sodium 140 135 - 145 mmol/L   Potassium 3.4 (L) 3.5 - 5.1 mmol/L   Chloride 101 98 - 111 mmol/L   CO2 27 22 - 32 mmol/L   Glucose, Bld 126 (H) 70 - 99 mg/dL    Comment: Glucose reference range applies only to samples taken after fasting for at least 8 hours.   BUN 16 8 - 23 mg/dL   Creatinine, Ser 5.78 0.61 - 1.24 mg/dL   Calcium 9.1 8.9 - 46.9 mg/dL   Total Protein 6.1 (L) 6.5 - 8.1 g/dL   Albumin 3.1 (L) 3.5 - 5.0 g/dL   AST 29 15 - 41 U/L   ALT 23 0 -  44 U/L   Alkaline Phosphatase 79 38 - 126 U/L   Total Bilirubin 0.5 0.0 - 1.2 mg/dL   GFR, Estimated >09 >81 mL/min    Comment: (NOTE) Calculated using the CKD-EPI Creatinine Equation (2021)    Anion gap 12 5 - 15    Comment: Performed at [facility], [address], [place], [place] [address]  Heparin level (unfractionated)     Status: Abnormal   Collection Time: 10/21/23  8:48 AM  Result Value Ref Range   Heparin Unfractionated 0.83 (H) 0.30 - 0.70 IU/mL    Comment: (NOTE) The clinical reportable range upper limit is being lowered to >1.10 to align with the FDA approved guidance for the current laboratory assay.  If heparin results are below expected values, and patient  dosage has  been confirmed, suggest follow up testing of antithrombin III levels. Performed at [facility], [address] [place], [place] [address]   ECHOCARDIOGRAM COMPLETE     Status: None   Collection Time: 10/21/23  2:24 PM  Result Value Ref Range   Weight 6,232 oz   Height 70 in   BP 134/98 mmHg   AV Peak grad 10.2 mmHg   Ao pk vel 1.59 m/s   Area-P 1/2 3.68 cm2   Est EF 60 - 65%    AV Mean grad 4.3 mmHg  Surgical pathology     Status: None   Collection Time: 11/05/23 12:00 AM  Result Value Ref Range   SURGICAL PATHOLOGY      Surgical Pathology CASE: [id number] PATIENT: Acxel Prehn Flow Pathology Report     Clinical history: Leukocytosis     DIAGNOSIS:  Peripheral blood, flow cytometry: -  No immunophenotypic evidence of a lymphoproliferative disorder (i.e. no monoclonal B cells or immunophenotypically abnormal T cells detected).  GATING AND PHENOTYPIC ANALYSIS:  Gated population: Flow cytometric immunophenotyping is performed using antibodies to the antigens listed in the table below. Electronic gates are placed around a cell cluster displaying light scatter properties corresponding to: lymphocytes  Abnormal Cells in gated population: N/A  Phenotype of Abnormal Cells: N/A                       Lymphoid Antigens       Myeloid Antigens Miscellaneous CD2  tested    CD10 tested    CD11b     ND   CD45 tested CD3  tested    CD19 tested    CD11c     ND   HLA-Dr    ND CD4  tested    CD20 tested    CD13 ND   CD34 tested CD5  tested    CD22 ND   CD14 ND   CD38 tested CD7  t ested    CD79b     ND   CD15 ND   CD138     ND CD8  tested    CD103     ND   CD16 ND   TdT  ND CD25 ND   CD200     tested    CD33 ND   CD123     ND TCRab     ND   sKappa    tested    CD64 ND   CD41 ND TCRgd     tested    sLambda   tested    CD117     ND   CD61 ND CD56 tested    cKappa  ND   MPO  ND   CD71 ND CD57 ND   cLambda   ND        CD235aND       GROSS  DESCRIPTION:  One lavender top tube submitted from [facility] for lymphoma testing.    Final Diagnosis performed by Orene Desanctis DO.   Electronically signed 11/06/2023 Technical and / or Professional components performed at [facility], [address], [place], [place] [address].  The above tests were developed and their performance characteristics determined by the [facility] system for the physical and immunophenotypic characterization of cell populations. They have not been cleared by the U.S. Food and Drug administration. The  FDA has determined that such clearance or appr oval is not necessary. This test is used for clinical purposes. It should not be  regarded as investigational or for research   Rheumatoid factor     Status: None   Collection Time: 11/05/23 10:22 AM  Result Value Ref Range   Rheumatoid fact SerPl-aCnc <10.0 <14.0 IU/mL    Comment: (NOTE) Performed At: [facility] [address] [place], [place] [id number] Jolene Schimke MD [id number]   ANA w/Reflex if Positive     Status: None   Collection Time: 11/05/23 10:22 AM  Result Value Ref Range   Anti Nuclear Antibody (ANA) Negative Negative    Comment: (NOTE) Performed At: [facility] [address] [place], [place] [id number] Jolene Schimke MD [id number]   JAK2 V617F rfx CALR/MPL/E12-15     Status: None   Collection Time: 11/05/23 10:22 AM  Result Value Ref Range   Specimen Type Comment:     Comment: NOT PROVIDED CORRECTED ON 01/22 AT 1436: PREVIOUSLY REPORTED AS DRAWN BY RN    JAK2 V617F Result Comment     Comment: (NOTE) NEGATIVE The JAK2 V617F mutation is not detected in the provided specimen of this individual. Results should be interpreted in conjunction with clinical and other laboratory findings for the most accurate interpretation. This test was developed and its performance characteristics determined by [facility]. It has not been cleared or  approved by the Food and Drug Administration.    Reflex Comment     Comment: (NOTE) Reflex to CALR Mutation Analysis, JAK2 Exon 12-15 Mutation Analysis, and MPL Mutation Analysis is indicated.    V617F Rfx CALR/MPL/E12-15 Bkgd Comment     Comment: (NOTE)    Molecular testing of blood or bone marrow is useful in the evaluation of suspected myeloproliferative neoplasms (MPN). Mutations in the JAK2, MPL, and CALR genes are present in virtually all MPNs and their presence help distinguish benign reactive processes from clonal neoplasms. These mutations are generally considered mutually exclusive, although concurrent clones have been reported in rare patients. This test will assess for the JAK2V617F (exon 14) mutation first and will reflex to CALR mutation analysis, MPL mutation analysis, and JAK2 exon 12 to 15 mutation analysis if the JAK2V617F mutation is negative.    The JAK2 (Janus kinase 2) gene encodes for a non-receptor protein tyrosine kinase that activates cytokine and growth factor signaling. The V617F (c.1849 G>T) mutation results in constitutive activation of JAK2 and downstream STAT5 and ERK signaling. The V617F mutation is observed in approximately 95% of polycythemia vera (PV), 60% of essential thromboc ythemia (ET) and primary myelofibrosis (PMF). It is also infrequently present (3-5%) in myelodysplastic syndrome, chronic myelomonocytic leukemia, and other atypical chronic myeloid disorders. A small percentage of JAK2 mutation positive patients (3.3%) contain other non-V617F mutations within exons  12 to 15. In particular, mutations in exon 12 of JAK2 have been described in approximately 3% of patients with PV. JAK2 allele burden correlates with clinical phenotype, with low levels of mutant allele characterized by thrombocytosis, intermediate levels with erythrocytosis, and high mutant allele burden correlating with enhanced myelopoiesis of the BM, leukocytosis,  increasing spleen size, and circulating CD34-positive cells.    The CALR (Calreticulin) gene encodes for a multifunctional calcium-binding protein involved in many cellular activities such as growth, proliferation, adhesion, and programmed cell death. Among patients with JAK2 negative MPNs, CALR are found in a pproximately 70% of patients with JAK2-negative essential thrombocythemia (ET) and 60-88% of patients with JAK2-negative primary myelofibrosis(PMF). Only a minority of patients (approximately 8%) with myelodysplasia have mutations in the CALR gene. CALR mutations are rarely detected in patients with de novo acute myeloid leukemia, chronic myelogenous leukemia, lymphoid leukemia, or solid tumors. CALR mutations are not detected in polycythemia and generally appear to be mutually exclusive with JAK2 mutations and MPL mutations. The majority of mutational changes involve a variety of insertion deletion mutations in exon 9 of the calreticulin gene: approximately 53% of all CALR mutations are a 52 bp deletion (type-1) while the second most prevalent mutation (approximately 32%) contains a 5 bp insertion (type-2). Other mutations (non-type 1 or type 2) are seen in a small minority of cases. CALR mutations in PMF tend to be with a favorable prognosis compared to JAK2 V617F [employer], whereas primary myelofibrosis negative for CALR, JAK2 V617F and MPL mutations (so-called triple negative) is associated with a poor prognosis and shorter survival.    The MPL (myeloproliferative leukemia virus oncogene) gene encodes the thrombopoietin receptor which regulates hematopoiesis and megakaryopoiesis. Activating MPL mutations are associated with a subset of myeloproliferative neoplasms and acute megakaryoblastic leukemia. MPL W515 mutations are present in approximately 5-8% of patients with primary myelofibrosis (PMF) and 1-4% of patients with essential thrombocythemia (ET). The S505 mutation is  detected in patients with hereditary thrombocythemia.    Limitations    This assay has a sensitivity of approximately 1% VAF for JAK2 V617F, 2.5% VAF for other mutations in JAK2 exons 12 to 15, CALR mutations, and MPL mutations. Deletions in JAK2 up to 6 bp and insertions up to 34 bp have been detected in validation studies. Deletions in CALR up to 70 bp and i nsertions up to 12 bp have been detected in validation studies.    Method based next generation sequencing.     Comment: Comment [employer]    References Comment     Comment: (NOTE) Alghasham N, Alnouri Y, Abalkhail H, Clarita Leber. Detection of mutations in JAK2 exons 12-15 by [employer] sequencing. Int J Lab Hematol. 2016 Feb;38(1):34-41. doi: 10.1111/ijlh.16109. Epub 2015 Sep 11. PMID: 60454098. Pura Spice, Unk Lightning, Hasserjian R, Patrica Duel, Borowitz MJ, Leroy Libman MM, [place] CD, [place], Vardiman JW. The 2016 revision to the World Health Organization classification of myeloid neoplasms and acute leukemia. Blood. 2016 May 19;127(20):2391-405. doi: 10.1182/blood-2016-03-643544. Epub 2016 Apr 11. PMID: 11914782. Genevie Ann [facility], Zhang ZJ, Evergreen S, Albitar M. Mutation profile of JAK2 transcripts in patients with chronic myeloproliferative neoplasias. J Mol Diagn. 2009 Jan;11(1):49-53.doi: 10.2353/jmoldx.2009.080114. Epub 2008 Dec 12. PMID: 95621308; PMCID: MVH8469629. NCCN Clinical Practice Guidelines in Oncology (NCCN Guidelines) Myeloproliferative Neoplasms Version 3.2022 - May 30, 2021. Swerdlow SH, Programmer, multimedia. WHO classif ication of Tumours of Haematopoietic and Lymphoid Tissues. 4th edn. Jaci Standard, [place]: Geologist, engineering for [employer] on [employer]; 2017. Tefferi A. Primary  myelofibrosis: 2021 update on diagnosis, risk-stratification and management. Am J Hematol. 2021 Jan;96(1):145-162. doi: 10.1002/ajh.26050. Epub 2020 Dec 2. PMID: 16109604. Royetta Car, Kralovics R. Genetic basis and  molecular pathophysiology of classical myeloproliferative neoplasms. Blood. 2017 Feb 9;129(6):667-679. doi: 10.1182/blood-2016-10-695940. Epub 2016 Dec 27. PMID: 54098119.    Director Review Comment     Comment: (NOTE) Mellody Life, PhD, [facility]    Director, Molecular Oncology    [facility]    [address] [place], [place] [address]    [phone] Performed At: [facility] [address] [place], [place] [id number] Maurine Simmering MDPhD WU:[id number] Performed At: [facility] [address] [place], [place] [id number] Maurine Simmering MDPhD KV:[id number]   Flow Cytometry, Peripheral Blood (Oncology)     Status: None   Collection Time: 11/05/23 10:22 AM  Result Value Ref Range   Flow Cytometry SEE SEPARATE REPORT     Comment: Performed at [facility], [address], [place], [place] [address]  C-reactive protein     Status: None   Collection Time: 11/05/23 10:22 AM  Result Value Ref Range   CRP <0.5 <1.0 mg/dL    Comment: Performed at [facility], [address] [place], [place] [address]  Sedimentation rate     Status: Abnormal   Collection Time: 11/05/23 10:22 AM  Result Value Ref Range   Sed Rate 25 (H) 0 - 16 mm/hr    Comment: Performed at [facility], [address], [place], [place] [address]  Lactate dehydrogenase     Status: None   Collection Time: 11/05/23 10:22 AM  Result Value Ref Range   LDH 143 98 - 192 U/L    Comment: Performed at [facility], [address], [place], [place] [address]  CBC with Differential/Platelet     Status: Abnormal   Collection Time: 11/05/23 10:22 AM  Result Value Ref Range   WBC 11.5 (H) 4.0 - 10.5 K/uL   RBC 4.53 4.22 - 5.81 MIL/uL   Hemoglobin 13.8 13.0 - 17.0 g/dL   HCT 01.0 93.2 - 35.5 %   MCV 87.9 80.0 - 100.0 fL   MCH 30.5 26.0 - 34.0 pg   MCHC 34.7 30.0 - 36.0 g/dL   RDW 73.2 (H) 20.2 - 54.2 %   Platelets 313 150 - 400  K/uL   nRBC 0.0 0.0 - 0.2 %   Neutrophils Relative % 65 %   Neutro Abs 7.4 1.7 - 7.7 K/uL   Lymphocytes Relative 25 %   Lymphs Abs 2.9 0.7 - 4.0 K/uL   Monocytes Relative 8 %   Monocytes Absolute 1.0 0.1 - 1.0 K/uL   Eosinophils Relative 1 %   Eosinophils Absolute 0.2 0.0 - 0.5 K/uL   Basophils Relative 0 %   Basophils Absolute 0.1 0.0 - 0.1 K/uL   Immature Granulocytes 1 %   Abs Immature Granulocytes 0.07 0.00 - 0.07 K/uL    Comment: Performed at [facility], [address] [place], [place] [address]  Comprehensive metabolic panel     Status: Abnormal   Collection Time: 11/05/23 10:22 AM  Result Value Ref Range   Sodium 138 135 - 145 mmol/L   Potassium 4.2 3.5 - 5.1 mmol/L   Chloride 102 98 - 111 mmol/L   CO2 26 22 - 32 mmol/L   Glucose, Bld 105 (H) 70 - 99 mg/dL    Comment: Glucose reference range applies only to samples taken  after fasting for at least 8 hours.   BUN 26 (H) 8 - 23 mg/dL   Creatinine, Ser 4.09 0.61 - 1.24 mg/dL   Calcium 9.4 8.9 - 81.1 mg/dL   Total Protein 6.7 6.5 - 8.1 g/dL   Albumin 4.1 3.5 - 5.0 g/dL   AST 15 15 - 41 U/L   ALT 19 0 - 44 U/L   Alkaline Phosphatase 76 38 - 126 U/L   Total Bilirubin 0.8 0.0 - 1.2 mg/dL   GFR, Estimated >91 >47 mL/min    Comment: (NOTE) Calculated using the CKD-EPI Creatinine Equation (2021)    Anion gap 10 5 - 15    Comment: Performed at [facility], [address] [place], [place] [address]  CALR +MPL + E12-E15 (reflexed)     Status: None   Collection Time: 11/05/23 10:22 AM  Result Value Ref Range   CALR Result Comment     Comment: (NOTE) NEGATIVE No insertions or deletions were detected within the analyzed region of the calreticulin (CALR) gene. A negative result does not entirely exclude the possibility of a clonal population carrying CALR gene mutations that are not covered by this assay. Results should be interpreted in conjunction with clinical and laboratory  findings for the most accurate interpretation.    MPL Result Comment     Comment: (NOTE) NEGATIVE No MPL mutation was identified in the provided specimen of this individual. Results should be interpreted in conjunction with clinical and other laboratory findings for the most accurate interpretation.    E12-15 Result Comment     Comment: (NOTE) NEGATIVE JAK2 mutations were not detected in exons 12, 13, 14 and 15. This result does not rule out the presence of JAK2 mutation at a level below the detection sensitivity of this assay, the presence of other mutations outside the analyzed region of the JAK2 gene, or the presence of a myeloproliferative or other neoplasm. Result must be correlated with other clinical data for the most accurate diagnosis. Performed At: [facility] [address] [place], [place] [id number] Maurine Simmering MDPhD IO:[id number]   BCR-ABL1 FISH     Status: None   Collection Time: 11/05/23 10:23 AM  Result Value Ref Range   Specimen Type BLOOD    Cells Counted 200    Cells Analyzed 200    FISH Result ABL1 GENE FUSION     Comment: Comment: NO BCR    Interpretation Comment:     Comment: (NOTE) NEGATIVE             nuc ish 9q34(ASS1,ABL1)x2,22q11.2(BCRx2)[200].      The fluorescence in situ hybridization (FISH) study was normal. FISH, using unique sequence DNA probes for the ABL1 and BCR gene regions showed two ABL1 signals (red), two control ASS1 gene signals (aqua) located adjacent to the ABL1 locus at 9q34, and two BCR signals (green) at 22q11.2 in all interphase nuclei examined. There was NO evidence of CML or ALL-associated BCR::ABL1 dual fusion signals in this analysis. .      This analysis is limited to abnormalities detectable by the specific probes included in the study. FISH results should be interpreted within the context of a full cytogenetic analysis and pathology evaluation.  A BCR::ABL1 gene fusion in greater than 3 interphase  nuclei in a patient with a new clinical diagnosis is considered positive. The DNA probe vendor for this study was [employer] worker, community). .      This test was developed and its performance  characteristics d etermined by [employer] ([employer]). It has not been cleared or approved by the U.S. Food and Drug Administration.    Director Review: Comment:     Comment: (NOTE) Nonnie Done, PhD, [facility] Performed At: [facility] RT[facility] [address] [place], [place] [id number] Maurine Simmering MDPhD WU:[id number]      RADIOGRAPHIC STUDIES:  I have personally reviewed the radiological images as listed and agree with the findings in the report.  ECHOCARDIOGRAM COMPLETE Result Date: 10/21/2023    ECHOCARDIOGRAM REPORT   Patient Name:   HOY FALLERT Date of Exam: 10/21/2023 Medical Rec #:  [id number]      Height:       70.0 in Accession #:    [id number]     Weight:       389.5 lb Date of Birth:  09/30/1956      BSA:          2.771 m Patient Age:    67 years       BP:           163/99 mmHg Patient Gender: M              HR:           81 bpm. Exam Location:  Inpatient Procedure: 2D Echo, Cardiac Doppler, Color Doppler and Intracardiac            Opacification Agent Indications:    Afib  History:        Patient has no prior history of Echocardiogram examinations.                 TIA; Risk Factors:Hypertension.  Sonographer:    Darlys Gales Referring Phys: [phone] HAO MENG IMPRESSIONS  1. Left ventricular ejection fraction, by estimation, is 60 to 65%. The left ventricle has normal function. Left ventricular endocardial border not optimally defined to evaluate regional wall motion. not well visualized left ventricular hypertrophy. Left ventricular diastolic parameters are indeterminate.  2. Right ventricular systolic function was not well visualized. The right ventricular size is not well visualized. Tricuspid regurgitation signal is inadequate for assessing PA pressure.  3. The mitral  valve was not well visualized. Trivial mitral valve regurgitation. No evidence of mitral stenosis.  4. The aortic valve was not well visualized. Aortic valve regurgitation is not visualized. No aortic stenosis is present.  5. Aortic dilatation noted. There is mild dilatation of the ascending aorta, measuring 41 mm.  6. Technically difficult study, limited visualization FINDINGS  Left Ventricle: Left ventricular ejection fraction, by estimation, is 60 to 65%. The left ventricle has normal function. Left ventricular endocardial border not optimally defined to evaluate regional wall motion. The left ventricular internal cavity size was normal in size. Not well visualized left ventricular hypertrophy. Left ventricular diastolic parameters are indeterminate. Right Ventricle: The right ventricular size is not well visualized. Right vetricular wall thickness was not well visualized. Right ventricular systolic function was not well visualized. Tricuspid regurgitation signal is inadequate for assessing PA pressure. Left Atrium: Left atrial size was not well visualized. Right Atrium: Right atrial size was not well visualized. Pericardium: There is no evidence of pericardial effusion. Mitral Valve: The mitral valve was not well visualized. Trivial mitral valve regurgitation. No evidence of mitral valve stenosis. Tricuspid Valve: The tricuspid valve is not well visualized. Tricuspid valve regurgitation is not demonstrated. No evidence of tricuspid stenosis. Aortic Valve: The aortic valve was not well visualized. Aortic valve regurgitation is  not visualized. No aortic stenosis is present. Aortic valve mean gradient measures 4.3 mmHg. Aortic valve peak gradient measures 10.2 mmHg. Pulmonic Valve: The pulmonic valve was not well visualized. Pulmonic valve regurgitation is not visualized. No evidence of pulmonic stenosis. Aorta: The aortic root was not well visualized and aortic dilatation noted. There is mild dilatation of the  ascending aorta, measuring 41 mm. IAS/Shunts: The interatrial septum was not well visualized.   Diastology LV e' medial:    8.59 cm/s LV E/e' medial:  15.1 LV e' lateral:   9.46 cm/s LV E/e' lateral: 13.7  RIGHT VENTRICLE RV S prime:     13.40 cm/s LEFT ATRIUM              Index LA Vol (A2C):   80.1 ml  28.91 ml/m LA Vol (A4C):   189.0 ml 68.21 ml/m LA Biplane Vol: 125.0 ml 45.11 ml/m  AORTIC VALVE AV Vmax:           159.49 cm/s AV Vmean:          92.907 cm/s AV VTI:            0.262 m AV Peak Grad:      10.2 mmHg AV Mean Grad:      4.3 mmHg LVOT Vmax:         113.00 cm/s LVOT Vmean:        83.400 cm/s LVOT VTI:          0.198 m LVOT/AV VTI ratio: 0.76  AORTA Ao Asc diam: 41.00 cm MITRAL VALVE MV Area (PHT): 3.68 cm     SHUNTS MV Decel Time: 206 msec     Systemic VTI: 0.20 m MV E velocity: 130.00 cm/s Dina Rich MD Electronically signed by Dina Rich MD Signature Date/Time: 10/21/2023/2:45:38 PM    Final    DG Chest 2 View Result Date: 10/20/2023 CLINICAL DATA:  Shortness of breath, chest tightness EXAM: CHEST - 2 VIEW COMPARISON:  12/31/2016 FINDINGS: Linear scarring or atelectasis laterally at the left lung base as before. No new infiltrate or overt edema. Heart size and mediastinal contours are within normal limits. No effusion. Visualized bones unremarkable. IMPRESSION: No acute cardiopulmonary disease. Electronically Signed   By: Corlis Leak M.D.   On: 10/20/2023 12:22   No orders of the defined types were placed in this encounter.    Future Appointments  Date Time Provider Department Center  12/11/2023  9:00 AM Eustace Pen, PA-C [facility] None  02/01/2024 11:15 AM [facility] PHLEBOTOMIST [facility] None  02/01/2024 11:40 AM Marigene Erler, Archie Patten, MD [facility] None    This document was completed utilizing speech recognition software. Grammatical errors, random word insertions, pronoun errors, and incomplete sentences are an occasional consequence of this system due to software limitations,  ambient noise, and hardware issues. Any formal questions or concerns about the content, text or information contained within the body of this dictation should be directly addressed to the provider for clarification.

## 2023-11-17 NOTE — Assessment & Plan Note (Signed)
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 Given the long-standing nature and stability, a bone marrow disorder is unlikely. Chronic inflammation from arthritis is a possible contributing factor.   On his initial consultation with [place] on 11/03/2023, CBC showed white count of 11,500 with normal differential.  Hemoglobin 13.8, platelet count normal at 313,000.  CMP was grossly unremarkable.  Sed rate was elevated at 25 mm/h.  CRP, LDH, were within normal limits.  BCR/ABL 1 negative.  JAK2 mutation, CALR, MPL mutations were all negative.  Flow cytometry of peripheral blood was unremarkable. ANA and Rheumatoid factor were negative.    Chronic leukocytosis seems to be reactive to inflammation.  No acute bone marrow pathology noted on workup.  He was provided reassurance.  Will continue surveillance alone.  Plan to see him again in 3 months for follow-up.

## 2023-11-17 NOTE — Assessment & Plan Note (Signed)
Recent episode likely triggered by a flu infection. Currently on Eliquis and losartan. Echocardiogram and EKG have been normal. Discussed that the recent AFib episode may be a one-time occurrence related to the flu. Emphasized the importance of continuing Eliquis to prevent clot formation and losartan for blood pressure management. Patient hopes to discontinue Eliquis if AFib is determined to be a one-time event. - Continue Eliquis 5 mg twice daily - Continue losartan - Follow-up with cardiologist for further evaluation and management

## 2023-11-17 NOTE — Assessment & Plan Note (Signed)
Significant weight loss and actively working on [employer]. Quit smoking and drinking several years ago. Discussed the importance of maintaining a healthy diet and regular exercise to support overall health and manage arthritis symptoms. - Encourage continued weight management and regular exercise - Reinforce the importance of maintaining a healthy diet and lifestyle

## 2023-11-19 ENCOUNTER — Other Ambulatory Visit: Payer: Medicare Other

## 2023-12-03 NOTE — Progress Notes (Signed)
Unable to reach patient about procedure, but was able to leave a detailed message. Stated that the patient needed to arrive at the hospital at 0700, remain NPO after 0000, needs to have a ride home and a responsible adult to stay with them for 24 hours after the procedure. Instructed the patient to call back if they had any questions. Also instructed them to take their eliquis in the morning prior to coming to hospital.

## 2023-12-04 ENCOUNTER — Encounter (HOSPITAL_COMMUNITY)
Admission: RE | Disposition: A | Discharge status: Home / Self Care | Payer: Self-pay | Source: Home / Self Care | Attending: Cardiovascular Disease

## 2023-12-04 ENCOUNTER — Ambulatory Visit (HOSPITAL_COMMUNITY): Payer: Medicare Other | Admitting: Certified Registered"

## 2023-12-04 ENCOUNTER — Encounter (HOSPITAL_COMMUNITY): Payer: Self-pay | Admitting: Cardiovascular Disease

## 2023-12-04 ENCOUNTER — Ambulatory Visit (HOSPITAL_COMMUNITY)
Admission: RE | Admit: 2023-12-04 | Discharge: 2023-12-04 | Disposition: A | Discharge status: Home / Self Care | Payer: Medicare Other | Attending: Cardiovascular Disease | Admitting: Cardiovascular Disease

## 2023-12-04 ENCOUNTER — Other Ambulatory Visit: Payer: Self-pay

## 2023-12-04 DIAGNOSIS — M1990 Unspecified osteoarthritis, unspecified site: Secondary | ICD-10-CM | POA: Diagnosis not present

## 2023-12-04 DIAGNOSIS — I4891 Unspecified atrial fibrillation: Secondary | ICD-10-CM

## 2023-12-04 DIAGNOSIS — I10 Essential (primary) hypertension: Secondary | ICD-10-CM | POA: Diagnosis not present

## 2023-12-04 DIAGNOSIS — Z87891 Personal history of nicotine dependence: Secondary | ICD-10-CM | POA: Insufficient documentation

## 2023-12-04 DIAGNOSIS — I4819 Other persistent atrial fibrillation: Secondary | ICD-10-CM | POA: Insufficient documentation

## 2023-12-04 DIAGNOSIS — Z7901 Long term (current) use of anticoagulants: Secondary | ICD-10-CM | POA: Diagnosis not present

## 2023-12-04 DIAGNOSIS — D6869 Other thrombophilia: Secondary | ICD-10-CM | POA: Diagnosis not present

## 2023-12-04 DIAGNOSIS — Z8673 Personal history of transient ischemic attack (TIA), and cerebral infarction without residual deficits: Secondary | ICD-10-CM | POA: Insufficient documentation

## 2023-12-04 HISTORY — PX: CARDIOVERSION: EP1203

## 2023-12-04 SURGERY — CARDIOVERSION (CATH LAB)
Anesthesia: General

## 2023-12-04 MED ORDER — SODIUM CHLORIDE 0.9% FLUSH: 3.0000 mL | INTRAVENOUS | Status: DC | PRN

## 2023-12-04 MED ORDER — LIDOCAINE 2% (20 MG/ML) 5 ML SYRINGE
INTRAMUSCULAR | Status: DC | PRN
  Administered 2023-12-04: 40 mg via INTRAVENOUS

## 2023-12-04 MED ORDER — SODIUM CHLORIDE 0.9% FLUSH: 3.0000 mL | Freq: Two times a day (BID) | INTRAVENOUS | Status: DC

## 2023-12-04 MED ORDER — PROPOFOL 10 MG/ML IV BOLUS
INTRAVENOUS | Status: DC | PRN
  Administered 2023-12-04: 70 mg via INTRAVENOUS

## 2023-12-04 SURGICAL SUPPLY — 1 items: PAD DEFIB RADIO PHYSIO CONN (PAD) ×2 IMPLANT

## 2023-12-04 NOTE — CV Procedure (Signed)
   DIRECT CURRENT CARDIOVERSION  NAME:  Ryan [name]    MRN: [id number] DOB:  [birth year]    ADMIT DATE: 12/04/2023  Indication:  Symptomatic atrial fibrillation   Procedure Note:  The patient signed informed consent.  They have had had therapeutic anticoagulation with eliquis greater than 3 weeks.  Anesthesia was administered by Dr. Ace Gins.  Adequate airway was maintained throughout and vital followed per protocol.  They were cardioverted x 1 with 200J of biphasic synchronized energy.  They converted to NSR.  There were no apparent complications.  The patient had normal neuro status and respiratory status post procedure with vitals stable as recorded elsewhere.    Follow up: They will continue on current medical therapy and follow up with cardiology as scheduled.  Gerri Spore T. Flora Lipps, MD, [facility]  [facility]  [address] [place], [place] [address] [phone]  8:44 AM

## 2023-12-04 NOTE — Interval H&P Note (Signed)
History and Physical Interval Note:  12/04/2023 7:19 AM  Ryan [name]  has presented today for surgery, with the diagnosis of AFIB.  The various methods of treatment have been discussed with the patient and family. After consideration of risks, benefits and other options for treatment, the patient has consented to  Procedure(s): CARDIOVERSION (N/A) as a surgical intervention.  The patient's history has been reviewed, patient examined, no change in status, stable for surgery.  I have reviewed the patient's chart and labs.  Questions were answered to the patient's satisfaction.    NPO for DCCV. On eliquis > 3 weeks.   Gerri Spore T. Flora Lipps, MD, [facility]  [facility]  [address], [address] [place], [place] [address] [phone]  7:19 AM

## 2023-12-04 NOTE — Discharge Instructions (Signed)

## 2023-12-04 NOTE — Transfer of Care (Signed)
Immediate Anesthesia Transfer of Care Note  Patient: Ryan [name]  Procedure(s) Performed: CARDIOVERSION  Patient Location: PACU and Cath Lab  Anesthesia Type:General  Level of Consciousness: drowsy, patient cooperative, and responds to stimulation  Airway & Oxygen Therapy: Patient Spontanous Breathing and Patient connected to nasal cannula oxygen  Post-op Assessment: Report given to RN and Post -op Vital signs reviewed and unstable, Anesthesiologist notified  Post vital signs: Reviewed and stable  Last Vitals:  Vitals Value Taken Time  BP 134/98 12/04/23 0835  Temp    Pulse 87 12/04/23 0835  Resp 15 12/04/23 0835  SpO2 94 % 12/04/23 0835  Vitals shown include unfiled device data.  Last Pain:  Vitals:   12/04/23 0727  TempSrc:   PainSc: 0-No pain         Complications: No notable events documented.

## 2023-12-04 NOTE — Anesthesia Postprocedure Evaluation (Signed)
Anesthesia Post Note  Patient: Ryan [name]  Procedure(s) Performed: CARDIOVERSION     Patient location during evaluation: PACU Anesthesia Type: General Level of consciousness: awake and alert Pain management: pain level controlled Vital Signs Assessment: post-procedure vital signs reviewed and stable Respiratory status: spontaneous breathing, nonlabored ventilation, respiratory function stable and patient connected to nasal cannula oxygen Cardiovascular status: blood pressure returned to baseline and stable Postop Assessment: no apparent nausea or vomiting Anesthetic complications: no   No notable events documented.  Last Vitals:  Vitals:   12/04/23 0800 12/04/23 0847  BP: (!) 142/100 (!) 136/115  Pulse: 85 97  Resp: 17 19  Temp:    SpO2: 94% 97%    Last Pain:  Vitals:   12/04/23 0847  TempSrc:   PainSc: 0-No pain                 Mariann Barter

## 2023-12-04 NOTE — Anesthesia Preprocedure Evaluation (Signed)
Anesthesia Evaluation  Patient identified by MRN, date of birth, ID band Patient awake    Reviewed: Allergy & Precautions, NPO status , Patient's Chart, lab work & pertinent test results, reviewed documented beta blocker date and time   History of Anesthesia Complications Negative for: history of anesthetic complications  Airway Mallampati: III  TM Distance: >3 FB     Dental no notable dental hx.    Pulmonary shortness of breath and with exertion, sleep apnea , former smoker   breath sounds clear to auscultation       Cardiovascular hypertension, (-) CAD, (-) Past MI, (-) Cardiac Stents and (-) CABG + dysrhythmias Atrial Fibrillation  Rhythm:Regular Rate:Normal     Neuro/Psych neg Seizures  Anxiety     TIA   GI/Hepatic ,neg GERD  ,,(+) neg Cirrhosis        Endo/Other    Renal/GU Renal disease     Musculoskeletal  (+) Arthritis ,    Abdominal   Peds  Hematology   Anesthesia Other Findings   Reproductive/Obstetrics                             Anesthesia Physical Anesthesia Plan  ASA: 2  Anesthesia Plan: General   Post-op Pain Management:    Induction: Intravenous  PONV Risk Score and Plan: 2 and Propofol infusion  Airway Management Planned: Nasal Cannula and Natural Airway  Additional Equipment:   Intra-op Plan:   Post-operative Plan:   Informed Consent: I have reviewed the patients History and Physical, chart, labs and discussed the procedure including the risks, benefits and alternatives for the proposed anesthesia with the patient or authorized representative who has indicated his/her understanding and acceptance.     Dental advisory given  Plan Discussed with: CRNA  Anesthesia Plan Comments:        Anesthesia Quick Evaluation

## 2023-12-11 ENCOUNTER — Ambulatory Visit (HOSPITAL_COMMUNITY)
Admission: RE | Admit: 2023-12-11 | Discharge: 2023-12-11 | Disposition: A | Discharge status: Home / Self Care | Payer: Medicare Other | Source: Ambulatory Visit | Attending: Internal Medicine | Admitting: Internal Medicine

## 2023-12-11 VITALS — BP 110/80 | HR 100 | Ht 70.0 in | Wt 386.4 lb

## 2023-12-11 DIAGNOSIS — Z8673 Personal history of transient ischemic attack (TIA), and cerebral infarction without residual deficits: Secondary | ICD-10-CM | POA: Diagnosis not present

## 2023-12-11 DIAGNOSIS — K760 Fatty (change of) liver, not elsewhere classified: Secondary | ICD-10-CM | POA: Diagnosis not present

## 2023-12-11 DIAGNOSIS — Z79899 Other long term (current) drug therapy: Secondary | ICD-10-CM | POA: Diagnosis not present

## 2023-12-11 DIAGNOSIS — D6869 Other thrombophilia: Secondary | ICD-10-CM | POA: Insufficient documentation

## 2023-12-11 DIAGNOSIS — Z6841 Body Mass Index (BMI) 40.0 and over, adult: Secondary | ICD-10-CM | POA: Diagnosis not present

## 2023-12-11 DIAGNOSIS — I4819 Other persistent atrial fibrillation: Secondary | ICD-10-CM | POA: Diagnosis present

## 2023-12-11 DIAGNOSIS — I10 Essential (primary) hypertension: Secondary | ICD-10-CM | POA: Diagnosis not present

## 2023-12-11 DIAGNOSIS — G4733 Obstructive sleep apnea (adult) (pediatric): Secondary | ICD-10-CM | POA: Diagnosis not present

## 2023-12-11 DIAGNOSIS — E669 Obesity, unspecified: Secondary | ICD-10-CM | POA: Diagnosis not present

## 2023-12-11 DIAGNOSIS — Z7901 Long term (current) use of anticoagulants: Secondary | ICD-10-CM | POA: Insufficient documentation

## 2023-12-11 MED ORDER — METOPROLOL SUCCINATE ER 100 MG PO TB24: 100.0000 mg | ORAL_TABLET | Freq: Every day | ORAL | 2 refills | Status: DC

## 2023-12-11 MED ORDER — METOPROLOL SUCCINATE ER 25 MG PO TB24: 25.0000 mg | ORAL_TABLET | Freq: Every day | ORAL | 2 refills | Status: DC

## 2023-12-11 MED ORDER — RIVAROXABAN 20 MG PO TABS: 20.0000 mg | ORAL_TABLET | Freq: Every day | ORAL | 2 refills | Status: DC

## 2023-12-11 MED ORDER — LOSARTAN POTASSIUM 25 MG PO TABS: 25.0000 mg | ORAL_TABLET | Freq: Every day | ORAL | 2 refills | Status: DC

## 2023-12-11 NOTE — Patient Instructions (Signed)
Stop Eliquis   Start Xarelto 20mg  ONCE a day WITH SUPPER   Decrease losartan to 25mg  once a day   Increase metoprolol to 125mg  once a day

## 2023-12-11 NOTE — Progress Notes (Signed)
Primary Care Physician: Galvin Proffer, MD Primary Cardiologist: None Electrophysiologist: None     Referring Physician: Dr. Ramon Dredge is a 68 y.o. male with a history of HTN, TIA in 2019, fatty liver disease, obesity, dilated aortic root, OSA not on CPAP, and paroxysmal atrial fibrillation who presents for consultation in the [facility]. Hospital admission 10/20/23-10/21/23 for chest pain found have flu type A and new onset Afib (suspect persistent). Patient is on Eliquis 5 mg BID for a CHADS2VASC score of 4.  On evaluation today, he is currently in Afib. Patient drinks decaf coffee, does not drink any alcohol, and states he does not snore. He admits to me since hospital discharge he has been cutting his Eliquis in half and taking half tablet twice daily since day of discharge.   On follow up 12/11/23, he is currently in NSR. S/p successful DCCV on 12/04/23. No missed doses of Eliquis 5 mg BID. He notes to always have an elevated pulse. Patient and wife note when he would previously sleep on his back she would have to wake him because it sounded like he stopped breathing. Patient tells me he sleeps on his side and no longer does this. He feels like the Eliquis is making him sleepy.   Today, he denies symptoms of palpitations, chest pain, shortness of breath, orthopnea, PND, lower extremity edema, dizziness, presyncope, syncope, snoring, daytime somnolence, bleeding, or neurologic sequela. The patient is tolerating medications without difficulties and is otherwise without complaint today.    Atrial Fibrillation Risk Factors:  he does have symptoms or diagnosis of sleep apnea. he is compliant with CPAP therapy.  he has a BMI of Body mass index is 55.44 kg/m.Marland Kitchen Filed Weights   12/11/23 0858  Weight: (!) 175.3 kg     Current Outpatient Medications  Medication Sig Dispense Refill   atorvastatin (LIPITOR) 40 MG tablet Take 1 tablet (40 mg  total) by mouth daily. 90 tablet 4   Cholecalciferol (VITAMIN D3) 125 MCG (5000 UT) TABS Take 5,000 Units by mouth daily.     Collagen-Vitamin C-Biotin (COLLAGEN PO) Take 3 capsules by mouth daily. 5000 units total     furosemide (LASIX) 40 MG tablet TAKE 1 TABLET DAILY 90 tablet 1   metoprolol succinate (TOPROL XL) 25 MG 24 hr tablet Take 1 tablet (25 mg total) by mouth daily. Take along with 100mg  tablet to equal 125mg  daily 90 tablet 2   Multiple Vitamins-Minerals (ONE-A-DAY MENS 50+) TABS Take 1 tablet by mouth daily.     omeprazole (PRILOSEC OTC) 20 MG tablet Take 20 mg by mouth in the morning.     Potassium Chloride ER 20 MEQ TBCR Take 20 mEq by mouth daily.     rivaroxaban (XARELTO) 20 MG TABS tablet Take 1 tablet (20 mg total) by mouth daily with supper. 90 tablet 2   traMADol (ULTRAM) 50 MG tablet Take 50 mg by mouth every 6 (six) hours as needed (Arthritis).     losartan (COZAAR) 25 MG tablet Take 1 tablet (25 mg total) by mouth daily. 90 tablet 2   metoprolol succinate (TOPROL-XL) 100 MG 24 hr tablet Take 1 tablet (100 mg total) by mouth daily. Take along with 25mg  tablet to equal 125mg  90 tablet 2   No current facility-administered medications for this encounter.    Atrial Fibrillation Management history:  Previous antiarrhythmic drugs: none Previous cardioversions: 12/04/23 Previous ablations: none Anticoagulation history: Eliquis  ROS- All systems are reviewed and negative except as per the HPI above.  Physical Exam: BP 110/80   Pulse 100   Ht 5\' 10"  (1.778 m)   Wt (!) 175.3 kg   BMI 55.44 kg/m   GEN- The patient is well appearing, alert and oriented x 3 today.   Neck - no JVD or carotid bruit noted Lungs- Clear to ausculation bilaterally, normal work of breathing Heart- Regular rate and rhythm, no murmurs, rubs or gallops, PMI not laterally displaced Extremities- no clubbing, cyanosis, or edema Skin - no rash or ecchymosis noted   EKG today demonstrates   Vent. rate 100 BPM PR interval 248 ms QRS duration 126 ms QT/QTcB 376/485 ms P-R-T axes 32 80 14 Sinus rhythm with 1st degree A-V block with Premature atrial complexes Right bundle branch block Abnormal ECG When compared with ECG of 04-Dec-2023 08:46, PREVIOUS ECG IS PRESENT  Echo 10/21/23 demonstrated  1. Left ventricular ejection fraction, by estimation, is 60 to 65%. The  left ventricle has normal function. Left ventricular endocardial border  not optimally defined to evaluate regional wall motion. not well  visualized left ventricular hypertrophy.  Left ventricular diastolic parameters are indeterminate.   2. Right ventricular systolic function was not well visualized. The right  ventricular size is not well visualized. Tricuspid regurgitation signal is  inadequate for assessing PA pressure.   3. The mitral valve was not well visualized. Trivial mitral valve  regurgitation. No evidence of mitral stenosis.   4. The aortic valve was not well visualized. Aortic valve regurgitation  is not visualized. No aortic stenosis is present.   5. Aortic dilatation noted. There is mild dilatation of the ascending  aorta, measuring 41 mm.   6. Technically difficult study, limited visualization    ASSESSMENT & PLAN CHA2DS2-VASc Score = 4  The patient's score is based upon: CHF History: 0 HTN History: 1 Diabetes History: 0 Stroke History: 2 Vascular Disease History: 0 Age Score: 1 Gender Score: 0      ASSESSMENT AND PLAN: Persistent Atrial Fibrillation (ICD10:  I48.19) The patient's CHA2DS2-VASc score is 4, indicating a 4.8% annual risk of stroke.   S/p successful DCCV on 12/04/23.   He is currently in NSR. Monitor rhythm with Kardiamobile device at home.   We discussed sleep study for sleep apnea - patient initially denied having sleep apnea and stating he does not snore. Review of records show diagnosis of severe sleep apnea in 2018. Patient denies having a sleep study done. Defer  this for now, strongly suspect he still has sleep apnea although patient notes he does not have issues now that he sleeps on his side.   Increase Toprol to 125 mg daily. Decrease losartan to 25 mg daily.  Secondary Hypercoagulable State (ICD10:  D68.69) The patient is at significant risk for stroke/thromboembolism based upon his CHA2DS2-VASc Score of 4.  Continue Apixaban (Eliquis).  Patient notes the Eliquis makes him sleepy. After discussion, will transition to Xarelto 20 mg daily.   In reviewing patient's CHADSVASC score, he believes diagnosis of TIA in 2019 was incorrect and he did not have one.    Follow up 6 months Afib clinic.    Lake Bells, PA-C  [facility] [address] [place], [place] [address] [phone]

## 2024-02-01 ENCOUNTER — Inpatient Hospital Stay: Payer: Medicare Other | Attending: Oncology | Admitting: Oncology

## 2024-02-01 ENCOUNTER — Inpatient Hospital Stay: Payer: Medicare Other

## 2024-02-01 ENCOUNTER — Encounter: Payer: Self-pay | Admitting: Oncology

## 2024-02-01 ENCOUNTER — Telehealth: Payer: Self-pay | Admitting: Oncology

## 2024-02-01 VITALS — BP 153/104 | HR 100 | Temp 98.0°F | Resp 18 | Ht 70.0 in | Wt 390.3 lb

## 2024-02-01 DIAGNOSIS — Z79899 Other long term (current) drug therapy: Secondary | ICD-10-CM | POA: Diagnosis not present

## 2024-02-01 DIAGNOSIS — D72829 Elevated white blood cell count, unspecified: Secondary | ICD-10-CM | POA: Insufficient documentation

## 2024-02-01 DIAGNOSIS — Z0000 Encounter for general adult medical examination without abnormal findings: Secondary | ICD-10-CM | POA: Diagnosis not present

## 2024-02-01 LAB — COMPREHENSIVE METABOLIC PANEL WITH GFR
ALT: 16 U/L (ref 0–44)
AST: 16 U/L (ref 15–41)
Albumin: 4.3 g/dL (ref 3.5–5.0)
Alkaline Phosphatase: 90 U/L (ref 38–126)
Anion gap: 11 (ref 5–15)
BUN: 15 mg/dL (ref 8–23)
CO2: 31 mmol/L (ref 22–32)
Calcium: 10 mg/dL (ref 8.9–10.3)
Chloride: 97 mmol/L — ABNORMAL LOW (ref 98–111)
Creatinine, Ser: 1.03 mg/dL (ref 0.61–1.24)
GFR, Estimated: >60 mL/min (ref >60)
Glucose, Bld: 119 mg/dL — ABNORMAL HIGH (ref 70–99)
Potassium: 4.3 mmol/L (ref 3.5–5.1)
Sodium: 139 mmol/L (ref 135–145)
Total Bilirubin: 0.5 mg/dL (ref 0.0–1.2)
Total Protein: 7.5 g/dL (ref 6.5–8.1)

## 2024-02-01 LAB — CBC WITH DIFFERENTIAL/PLATELET
Abs Immature Granulocytes: 0.05 10*3/uL (ref 0.00–0.07)
Basophils Absolute: 0.1 10*3/uL (ref 0.0–0.1)
Basophils Relative: 1 %
Eosinophils Absolute: 0.2 10*3/uL (ref 0.0–0.5)
Eosinophils Relative: 2 %
HCT: 39.1 % (ref 39.0–52.0)
Hemoglobin: 13.3 g/dL (ref 13.0–17.0)
Immature Granulocytes: 0 %
Lymphocytes Relative: 18 %
Lymphs Abs: 2 10*3/uL (ref 0.7–4.0)
MCH: 30 pg (ref 26.0–34.0)
MCHC: 34 g/dL (ref 30.0–36.0)
MCV: 88.1 fL (ref 80.0–100.0)
Monocytes Absolute: 1.2 10*3/uL — ABNORMAL HIGH (ref 0.1–1.0)
Monocytes Relative: 11 %
Neutro Abs: 7.6 10*3/uL (ref 1.7–7.7)
Neutrophils Relative %: 68 %
Platelets: 246 10*3/uL (ref 150–400)
RBC: 4.44 MIL/uL (ref 4.22–5.81)
RDW: 13.9 % (ref 11.5–15.5)
WBC: 11.1 10*3/uL — ABNORMAL HIGH (ref 4.0–10.5)
nRBC: 0 % (ref 0.0–0.2)

## 2024-02-01 LAB — C-REACTIVE PROTEIN: CRP: 1 mg/dL — ABNORMAL HIGH (ref <1.0)

## 2024-02-01 LAB — SEDIMENTATION RATE: Sed Rate: 36 mm/h — ABNORMAL HIGH (ref 0–16)

## 2024-02-01 LAB — LACTATE DEHYDROGENASE: LDH: 147 U/L (ref 98–192)

## 2024-02-01 NOTE — Assessment & Plan Note (Addendum)
 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 Given the long-standing nature and stability, a bone marrow disorder is unlikely. Chronic inflammation from arthritis is a possible contributing factor.   On his initial consultation with us  on 11/03/2023, CBC showed white count of 11,500 with normal differential.  Hemoglobin 13.8, platelet count normal at 313,000.  CMP was grossly unremarkable.  Sed rate was elevated at 25 mm/h.  CRP, LDH, were within normal limits.  BCR/ABL 1 negative.  JAK2 mutation, CALR, MPL mutations were all negative.  Flow cytometry of peripheral blood was unremarkable. ANA and Rheumatoid factor were negative.   The current count is 11,100. There are no associated symptoms such as fevers, chills, night sweats, diarrhea, or urinary issues.  CRP and ESR remain elevated.  Chronic leukocytosis seems to be reactive to inflammation.  No acute bone marrow pathology noted on workup.  He was provided reassurance.  Will continue surveillance alone.    - Schedule follow-up in one year to monitor white blood cell count. - Discharge from hematology and oncology care if counts remain stable at the next visit.

## 2024-02-01 NOTE — Assessment & Plan Note (Signed)
 Significant weight loss and actively working on [employer]. Quit smoking and drinking several years ago. Discussed the importance of maintaining a healthy diet and regular exercise to support overall health and manage arthritis symptoms. - Encourage continued weight management and regular exercise - Reinforce the importance of maintaining a healthy diet and lifestyle

## 2024-02-01 NOTE — Telephone Encounter (Signed)
 Patient has been scheduled for follow-up visit per 02/01/24 LOS.  Pt aware of scheduled appt details.

## 2024-02-01 NOTE — Progress Notes (Signed)
 [facility]  HEMATOLOGY CLINIC PROGRESS NOTE  PATIENT NAME: Ryan [name]   MR#: [id number] DOB: 09/09/1956  Patient Care Team: Georgean Kindle, MD as PCP - General (Internal Medicine)  Date of visit: 02/01/2024   ASSESSMENT & PLAN:   MASSAI HANKERSON is a 68 y.o. gentleman with a past medical history of hypertension, dyslipidemia, TIA, hepatic steatosis, arthritis, past nicotine dependence, was referred to our service in January 2025 for evaluation of leukocytosis.  Grossly negative hematological workup except for elevated sed rate.  Leukocytosis is reactionary to inflammation.    Leukocytosis Chronic leukocytosis since at least 2010 with WBC counts ranging from 13,000 to 15,000. Given the long-standing nature and stability, a bone marrow disorder is unlikely. Chronic inflammation from arthritis is a possible contributing factor.   On his initial consultation with us  on 11/03/2023, CBC showed white count of 11,500 with normal differential.  Hemoglobin 13.8, platelet count normal at 313,000.  CMP was grossly unremarkable.  Sed rate was elevated at 25 mm/h.  CRP, LDH, were within normal limits.  BCR/ABL 1 negative.  JAK2 mutation, CALR, MPL mutations were all negative.  Flow cytometry of peripheral blood was unremarkable. ANA and Rheumatoid factor were negative.   The current count is 11,100. There are no associated symptoms such as fevers, chills, night sweats, diarrhea, or urinary issues.  CRP and ESR remain elevated.  Chronic leukocytosis seems to be reactive to inflammation.  No acute bone marrow pathology noted on workup.  He was provided reassurance.  Will continue surveillance alone.    - Schedule follow-up in one year to monitor white blood cell count. - Discharge from hematology and oncology care if counts remain stable at the next visit.  Healthcare maintenance Significant weight loss and actively working on weight management. Quit smoking and drinking several  years ago. Discussed the importance of maintaining a healthy diet and regular exercise to support overall health and manage arthritis symptoms. - Encourage continued weight management and regular exercise - Reinforce the importance of maintaining a healthy diet and lifestyle   I spent a total of 20 minutes during this encounter with the patient including review of chart and various tests results, discussions about plan of care and coordination of care plan.  I reviewed lab results and outside records for this visit and discussed relevant results with the patient. Diagnosis, plan of care and treatment options were also discussed in detail with the patient. Opportunity provided to ask questions and answers provided to his apparent satisfaction. Provided instructions to call our clinic with any problems, questions or concerns prior to return visit. I recommended to continue follow-up with PCP and sub-specialists. He verbalized understanding and agreed with the plan. No barriers to learning was detected.  Arlo Berber, MD  02/01/2024 2:57 PM  [facility] - A DEPT OF [facility] [address] [place] [address] Dept: [phone] Dept Fax: [phone]   CHIEF COMPLAINT/ REASON FOR VISIT:  Follow-up for chronic leukocytosis, likely reactionary to inflammation.  INTERVAL HISTORY:  Discussed the use of AI scribe software for clinical note transcription with the patient, who gave verbal consent to proceed.   History of Present Illness The patient presents for follow-up of elevated white blood cell count.  He has a history of being hospitalized on New Year's Day for atrial fibrillation and has been monitored since then. His white blood cell count has been slightly elevated since at  least 2010, with values ranging from 10,000 to 15,000, which is slightly above normal limits. Recent blood work shows a count of 11,100, which  is barely above normal. Previous workup ruled out significant concerns such as leukemia or bone marrow abnormalities. No fevers, chills, night sweats, diarrhea, or urinary problems.  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 This arthritis may contribute to inflammation, as indicated by a previously elevated sedimentation rate, an inflammation marker.   SUMMARY OF HEMATOLOGIC HISTORY:  On 09/09/2023, iron studies, B12, folate were all within normal limits.  CMP grossly unremarkable.  White count was elevated at 13,700 with ANC of 1600, normal differential.  Hemoglobin 15.6, platelet count 267,000.  Previously white count was elevated at 15,000 in August 2024.  Given persistent leukocytosis, referral was sent to [place] for further evaluation.   On review of records, he has had chronic, intermittent leukocytosis dating back to August 2010 with white count ranging from 10,000-15,000.   The patient first became aware of this issue in 2019, but the abnormality has been present since at least 2010. The patient has been monitoring the count through an online portal and notes that it fluctuates slightly but remains above the upper limit of normal. The patient denies any recent infections but does report a history of arthritis, which is severe and affects both shoulders and knees. The arthritis has been treated with steroid injections, which provide temporary relief. The patient also reports a recent diagnosis of atrial fibrillation, which occurred during a recent hospitalization for a flu-like illness and a GI virus. The patient is currently on Eliquis and Losartan for this condition.   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 Given the long-standing nature and stability, a bone marrow disorder is unlikely. Chronic inflammation from arthritis is a possible contributing factor.    On his  initial consultation with [place] on 11/03/2023, CBC showed white count of 11,500 with normal differential.  Hemoglobin 13.8, platelet count normal at 313,000.  CMP was grossly unremarkable.  Sed rate was elevated at 25 mm/h.  CRP, LDH, were within normal limits.  BCR/ABL 1 negative.  JAK2 mutation, CALR, MPL mutations were all negative.  Flow cytometry of peripheral blood was unremarkable. ANA and Rheumatoid factor were negative.    Chronic leukocytosis seems to be reactive to inflammation.  No acute bone marrow pathology noted on workup.  He was provided reassurance.  Will continue surveillance alone.  Plan to see him again in 3 months for follow-up.  I have reviewed the past medical history, past surgical history, social history and family history with the patient and they are unchanged from previous note.  ALLERGIES: He has no known allergies.  MEDICATIONS:  Current Outpatient Medications  Medication Sig Dispense Refill   atorvastatin (LIPITOR) 40 MG tablet Take 1 tablet (40 mg total) by mouth daily. 90 tablet 4   Cholecalciferol (VITAMIN D3) 125 MCG (5000 UT) TABS Take 5,000 Units by mouth daily.     Collagen-Vitamin C-Biotin (COLLAGEN PO) Take 3 capsules by mouth daily. 5000 units total     furosemide (LASIX) 40 MG tablet TAKE 1 TABLET DAILY 90 tablet 1   losartan (COZAAR) 25 MG tablet Take 1 tablet (25 mg total) by mouth daily. 90 tablet 2   metoprolol succinate (TOPROL XL) 25 MG 24 hr tablet Take 1 tablet (25 mg total) by mouth daily. Take along with 100mg  tablet  to equal 125mg  daily 90 tablet 2   metoprolol succinate (TOPROL-XL) 100 MG 24 hr tablet Take 1 tablet (100 mg total) by mouth daily. Take along with 25mg  tablet to equal 125mg  90 tablet 2   Multiple Vitamins-Minerals (ONE-A-DAY MENS 50+) TABS Take 1 tablet by mouth daily.     omeprazole (PRILOSEC OTC) 20 MG tablet Take 20 mg by mouth in the morning.     rivaroxaban (XARELTO) 20 MG TABS tablet Take 1 tablet (20 mg total) by mouth daily  with supper. 90 tablet 2   traMADol (ULTRAM) 50 MG tablet Take 50 mg by mouth every 6 (six) hours as needed (Arthritis).     No current facility-administered medications for this visit.     REVIEW OF SYSTEMS:    Review of Systems - Oncology  All other pertinent systems were reviewed with the patient and are negative.  PHYSICAL EXAMINATION:    Onc Performance Status - 02/01/24 1100       ECOG Perf Status   ECOG Perf Status Fully active, able to carry on all pre-disease performance without restriction      KPS SCALE   KPS % SCORE Normal, no compliants, no evidence of disease             Vitals:   02/01/24 1134 02/01/24 1135  BP: (!) 146/111 (!) 153/104  Pulse: 100   Resp: 18   Temp: 98 F (36.7 C)   SpO2: 98%    Filed Weights   02/01/24 1134  Weight: (!) 390 lb 4.8 oz (177 kg)    Physical Exam Constitutional:      General: He is not in acute distress.    Appearance: Normal appearance.  HENT:     Head: Normocephalic and atraumatic.  Eyes:     General: No scleral icterus.    Conjunctiva/sclera: Conjunctivae normal.  Cardiovascular:     Rate and Rhythm: Normal rate and regular rhythm.     Heart sounds: Normal heart sounds.  Pulmonary:     Effort: Pulmonary effort is normal.     Breath sounds: Normal breath sounds.  Abdominal:     General: There is no distension.  Musculoskeletal:     Right lower leg: No edema.     Left lower leg: No edema.  Neurological:     General: No focal deficit present.     Mental Status: He is alert and oriented to person, place, and time.  Psychiatric:        Mood and Affect: Mood normal.        Behavior: Behavior normal.        Thought Content: Thought content normal.     LABORATORY DATA:   I have reviewed the data as listed.  Results for orders placed or performed in visit on 02/01/24  C-reactive protein  Result Value Ref Range   CRP 1.0 (H) <1.0 mg/dL  Sedimentation rate  Result Value Ref Range   Sed Rate 36  (H) 0 - 16 mm/hr  Lactate dehydrogenase  Result Value Ref Range   LDH 147 98 - 192 U/L  Comprehensive metabolic panel  Result Value Ref Range   Sodium 139 135 - 145 mmol/L   Potassium 4.3 3.5 - 5.1 mmol/L   Chloride 97 (L) 98 - 111 mmol/L   CO2 31 22 - 32 mmol/L   Glucose, Bld 119 (H) 70 - 99 mg/dL   BUN 15 8 - 23 mg/dL   Creatinine, Ser 1.61 0.61 - 1.24  mg/dL   Calcium 91.4 8.9 - 78.2 mg/dL   Total Protein 7.5 6.5 - 8.1 g/dL   Albumin 4.3 3.5 - 5.0 g/dL   AST 16 15 - 41 U/L   ALT 16 0 - 44 U/L   Alkaline Phosphatase 90 38 - 126 U/L   Total Bilirubin 0.5 0.0 - 1.2 mg/dL   GFR, Estimated >95 >62 mL/min   Anion gap 11 5 - 15  CBC with Differential/Platelet  Result Value Ref Range   WBC 11.1 (H) 4.0 - 10.5 K/uL   RBC 4.44 4.22 - 5.81 MIL/uL   Hemoglobin 13.3 13.0 - 17.0 g/dL   HCT 13.0 86.5 - 78.4 %   MCV 88.1 80.0 - 100.0 fL   MCH 30.0 26.0 - 34.0 pg   MCHC 34.0 30.0 - 36.0 g/dL   RDW 69.6 29.5 - 28.4 %   Platelets 246 150 - 400 K/uL   nRBC 0.0 0.0 - 0.2 %   Neutrophils Relative % 68 %   Neutro Abs 7.6 1.7 - 7.7 K/uL   Lymphocytes Relative 18 %   Lymphs Abs 2.0 0.7 - 4.0 K/uL   Monocytes Relative 11 %   Monocytes Absolute 1.2 (H) 0.1 - 1.0 K/uL   Eosinophils Relative 2 %   Eosinophils Absolute 0.2 0.0 - 0.5 K/uL   Basophils Relative 1 %   Basophils Absolute 0.1 0.0 - 0.1 K/uL   Immature Granulocytes 0 %   Abs Immature Granulocytes 0.05 0.00 - 0.07 K/uL    RADIOGRAPHIC STUDIES:  No recent pertinent imaging studies available to review.  No orders of the defined types were placed in this encounter.    Future Appointments  Date Time Provider Department Center  06/10/2024  9:30 AM Nathanel Bal, PA-C [id number] None     This document was completed utilizing speech recognition software. Grammatical errors, random word insertions, pronoun errors, and incomplete sentences are an occasional consequence of this system due to software limitations, ambient noise, and  hardware issues. Any formal questions or concerns about the content, text or information contained within the body of this dictation should be directly addressed to the provider for clarification.

## 2024-03-08 LAB — LAB REPORT - SCANNED
A1c: 6.5
EGFR: 65
Free T4: 1.11 ng/dL
TSH: 4.11 (ref 0.41–5.90)

## 2024-03-30 ENCOUNTER — Other Ambulatory Visit: Payer: Self-pay | Admitting: Oncology

## 2024-03-30 DIAGNOSIS — D72829 Elevated white blood cell count, unspecified: Secondary | ICD-10-CM

## 2024-04-05 ENCOUNTER — Encounter: Payer: Self-pay | Admitting: Oncology

## 2024-04-05 ENCOUNTER — Inpatient Hospital Stay: Attending: Oncology

## 2024-04-05 ENCOUNTER — Inpatient Hospital Stay (HOSPITAL_BASED_OUTPATIENT_CLINIC_OR_DEPARTMENT_OTHER): Admitting: Oncology

## 2024-04-05 VITALS — BP 132/86 | HR 70 | Temp 98.3°F | Resp 18 | Ht 70.0 in | Wt 396.7 lb

## 2024-04-05 DIAGNOSIS — I4891 Unspecified atrial fibrillation: Secondary | ICD-10-CM | POA: Diagnosis not present

## 2024-04-05 DIAGNOSIS — E785 Hyperlipidemia, unspecified: Secondary | ICD-10-CM | POA: Diagnosis not present

## 2024-04-05 DIAGNOSIS — Z7901 Long term (current) use of anticoagulants: Secondary | ICD-10-CM | POA: Insufficient documentation

## 2024-04-05 DIAGNOSIS — D72829 Elevated white blood cell count, unspecified: Secondary | ICD-10-CM

## 2024-04-05 DIAGNOSIS — I10 Essential (primary) hypertension: Secondary | ICD-10-CM | POA: Insufficient documentation

## 2024-04-05 DIAGNOSIS — Z8673 Personal history of transient ischemic attack (TIA), and cerebral infarction without residual deficits: Secondary | ICD-10-CM | POA: Insufficient documentation

## 2024-04-05 LAB — CMP (CANCER CENTER ONLY)
ALT: 29 U/L (ref 0–44)
AST: 27 U/L (ref 15–41)
Albumin: 4.1 g/dL (ref 3.5–5.0)
Alkaline Phosphatase: 92 U/L (ref 38–126)
Anion gap: 12 (ref 5–15)
BUN: 14 mg/dL (ref 8–23)
CO2: 28 mmol/L (ref 22–32)
Calcium: 9.9 mg/dL (ref 8.9–10.3)
Chloride: 99 mmol/L (ref 98–111)
Creatinine: 0.9 mg/dL (ref 0.61–1.24)
GFR, Estimated: >60 mL/min (ref >60)
Glucose, Bld: 132 mg/dL — ABNORMAL HIGH (ref 70–99)
Potassium: 4.2 mmol/L (ref 3.5–5.1)
Sodium: 139 mmol/L (ref 135–145)
Total Bilirubin: 0.6 mg/dL (ref 0.0–1.2)
Total Protein: 6.9 g/dL (ref 6.5–8.1)

## 2024-04-05 LAB — CBC WITH DIFFERENTIAL (CANCER CENTER ONLY)
Abs Immature Granulocytes: 0.05 10*3/uL (ref 0.00–0.07)
Basophils Absolute: 0.1 10*3/uL (ref 0.0–0.1)
Basophils Relative: 1 %
Eosinophils Absolute: 0.2 10*3/uL (ref 0.0–0.5)
Eosinophils Relative: 2 %
HCT: 36 % — ABNORMAL LOW (ref 39.0–52.0)
Hemoglobin: 12 g/dL — ABNORMAL LOW (ref 13.0–17.0)
Immature Granulocytes: 1 %
Lymphocytes Relative: 22 %
Lymphs Abs: 2.3 10*3/uL (ref 0.7–4.0)
MCH: 28.9 pg (ref 26.0–34.0)
MCHC: 33.3 g/dL (ref 30.0–36.0)
MCV: 86.7 fL (ref 80.0–100.0)
Monocytes Absolute: 1 10*3/uL (ref 0.1–1.0)
Monocytes Relative: 9 %
Neutro Abs: 7.1 10*3/uL (ref 1.7–7.7)
Neutrophils Relative %: 65 %
Platelet Count: 236 10*3/uL (ref 150–400)
RBC: 4.15 MIL/uL — ABNORMAL LOW (ref 4.22–5.81)
RDW: 14.9 % (ref 11.5–15.5)
WBC Count: 10.6 10*3/uL — ABNORMAL HIGH (ref 4.0–10.5)
nRBC: 0 % (ref 0.0–0.2)

## 2024-04-05 LAB — SEDIMENTATION RATE: Sed Rate: 11 mm/h (ref 0–16)

## 2024-04-05 LAB — LACTATE DEHYDROGENASE: LDH: 163 U/L (ref 98–192)

## 2024-04-05 LAB — C-REACTIVE PROTEIN: CRP: 1.3 mg/dL — ABNORMAL HIGH (ref <1.0)

## 2024-04-05 NOTE — Progress Notes (Signed)
 [facility]  HEMATOLOGY CLINIC PROGRESS NOTE  PATIENT NAME: Ryan [name]   MR#: [id number] DOB: 1956-01-04  Patient Care Team: Georgean Kindle, MD as PCP - General (Internal Medicine)  Date of visit: 04/05/2024   ASSESSMENT & PLAN:   Ryan [name] is a 68 y.o. gentleman with a past medical history of hypertension, dyslipidemia, TIA, hepatic steatosis, arthritis, past nicotine dependence, was referred to our service in January 2025 for evaluation of leukocytosis.  Grossly negative hematological workup except for elevated sed rate.  Leukocytosis is reactionary to inflammation.    Leukocytosis Chronic leukocytosis since at least 2010 with WBC counts ranging from 13,000 to 15,000. Given the long-standing nature and stability, a bone marrow disorder is unlikely. Chronic inflammation from arthritis is a possible contributing factor.   On his initial consultation with us  on 11/03/2023, CBC showed white count of 11,500 with normal differential.  Hemoglobin 13.8, platelet count normal at 313,000.  CMP was grossly unremarkable.  Sed rate was elevated at 25 mm/h.  CRP, LDH, were within normal limits.  BCR/ABL 1 negative.  JAK2 mutation, CALR, MPL mutations were all negative.  Flow cytometry of peripheral blood was unremarkable. ANA and Rheumatoid factor were negative.   On 03/07/2024, labs at his PCPs office showed white count of 19,500 with ANC of 13,300, ALC of 4300, monocyte count of 1600.  Hemoglobin and platelets were normal.  Hence he was seen in clinic for evaluation sooner.  He was originally scheduled to come back to see us  in April 2026.  Repeat labs today showed stable white count of 10,600 with normal differential.  CMP, LDH unremarkable.  There are no associated symptoms such as fevers, chills, night sweats, diarrhea, or urinary issues.  CRP and ESR are pending.  Previously elevated.  Chronic leukocytosis seems to be reactive to inflammation and intermittent dental  infection.  No acute bone marrow pathology noted on workup.  He was provided reassurance.  Will continue surveillance alone.    - Schedule follow-up in one year to monitor white blood cell count. - Discharge from hematology and oncology care if counts remain stable at the next visit.   I spent a total of 20 minutes during this encounter with the patient including review of chart and various tests results, discussions about plan of care and coordination of care plan.  I reviewed lab results and outside records for this visit and discussed relevant results with the patient. Diagnosis, plan of care and treatment options were also discussed in detail with the patient. Opportunity provided to ask questions and answers provided to his apparent satisfaction. Provided instructions to call our clinic with any problems, questions or concerns prior to return visit. I recommended to continue follow-up with PCP and sub-specialists. He verbalized understanding and agreed with the plan. No barriers to learning was detected.  Arlo Berber, MD  04/05/2024 9:50 AM  [facility] - A DEPT OF [facility] [address] [place] [address] Dept: [phone] Dept Fax: [phone]   CHIEF COMPLAINT/ REASON FOR VISIT:  Follow-up for chronic leukocytosis, likely reactionary to inflammation.  INTERVAL HISTORY:  Discussed the use of AI scribe software for clinical note transcription with the patient, who gave verbal consent to proceed.   History of Present Illness  Ryan [name] is a 68 year old male who presents for follow-up of elevated white blood cell count.  On 03/07/2024, labs at  his PCPs office showed white count of 19,500 with ANC of 13,300, ALC of 4300, monocyte count of 1600.  Hemoglobin and platelets were normal.  Hence he was seen in clinic for evaluation sooner.  He was originally scheduled to come back to see us  in April  2026.  He attributes the fluctuations in his white blood cell count to his arthritis and a dental issue. His arthritis causes consistent pain, which he notes may have been more severe on the day of the higher white blood cell count. No obvious blood loss is reported, and his hemoglobin is slightly down at twelve, with a normal platelet count. Previous inflammatory markers, ESR and CRP, were high.  He mentions a dental issue where a previous root canal becomes inflamed, causing sensitivity that comes and goes.   SUMMARY OF HEMATOLOGIC HISTORY:  On 09/09/2023, iron studies, B12, folate were all within normal limits.  CMP grossly unremarkable.  White count was elevated at 13,700 with ANC of 1600, normal differential.  Hemoglobin 15.6, platelet count 267,000.  Previously white count was elevated at 15,000 in August 2024.  Given persistent leukocytosis, referral was sent to us  for further evaluation.   On review of records, he has had chronic, intermittent leukocytosis dating back to August 2010 with white count ranging from 10,000-15,000.   The patient first became aware of this issue in 2019, but the abnormality has been present since at least 2010. The patient has been monitoring the count through an online portal and notes that it fluctuates slightly but remains above the upper limit of normal. The patient denies any recent infections but does report a history of arthritis, which is severe and affects both shoulders and knees. The arthritis has been treated with steroid injections, which provide temporary relief. The patient also reports a recent diagnosis of atrial fibrillation, which occurred during a recent hospitalization for a flu-like illness and a GI virus. The patient is currently on Eliquis  and Losartan  for this condition.   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 Given the long-standing nature and stability, a bone marrow disorder is unlikely.  Chronic inflammation from arthritis is a possible contributing factor.    On his initial consultation with us  on 11/03/2023, CBC showed white count of 11,500 with normal differential.  Hemoglobin 13.8, platelet count normal at 313,000.  CMP was grossly unremarkable.  Sed rate was elevated at 25 mm/h.  CRP, LDH, were within normal limits.  BCR/ABL 1 negative.  JAK2 mutation, CALR, MPL mutations were all negative.  Flow cytometry of peripheral blood was unremarkable. ANA and Rheumatoid factor were negative.    Chronic leukocytosis seems to be reactive to inflammation.  No acute bone marrow pathology noted on workup.  He was provided reassurance.  Will continue surveillance alone.    I have reviewed the past medical history, past surgical history, social history and family history with the patient and they are unchanged from previous note.  ALLERGIES: He has no known allergies.  MEDICATIONS:  Current Outpatient Medications  Medication Sig Dispense Refill   atorvastatin  (LIPITOR) 40 MG tablet Take 1 tablet (40 mg total) by mouth daily. 90 tablet 4   Cholecalciferol (VITAMIN D3) 125 MCG (5000 UT) TABS Take 5,000 Units by mouth daily.     Collagen-Vitamin C-Biotin (COLLAGEN PO) Take 3 capsules by mouth daily. 5000 units total     furosemide  (LASIX ) 40 MG tablet TAKE 1 TABLET DAILY 90 tablet 1   losartan  (  COZAAR ) 25 MG tablet Take 1 tablet (25 mg total) by mouth daily. 90 tablet 2   metoprolol  succinate (TOPROL  XL) 25 MG 24 hr tablet Take 1 tablet (25 mg total) by mouth daily. Take along with 100mg  tablet to equal 125mg  daily 90 tablet 2   metoprolol  succinate (TOPROL -XL) 100 MG 24 hr tablet Take 1 tablet (100 mg total) by mouth daily. Take along with 25mg  tablet to equal 125mg  90 tablet 2   metoprolol  succinate (TOPROL -XL) 25 MG 24 hr tablet Take 25 mg by mouth daily.     Multiple Vitamins-Minerals (ONE-A-DAY MENS 50+) TABS Take 1 tablet by mouth daily.     omeprazole  (PRILOSEC  OTC) 20 MG tablet Take  20 mg by mouth in the morning.     omeprazole  (PRILOSEC ) 20 MG capsule Take 20 mg by mouth daily.     rivaroxaban  (XARELTO ) 20 MG TABS tablet Take 1 tablet (20 mg total) by mouth daily with supper. 90 tablet 2   traMADol (ULTRAM) 50 MG tablet Take 50 mg by mouth every 6 (six) hours as needed (Arthritis).     No current facility-administered medications for this visit.     REVIEW OF SYSTEMS:    Review of Systems - Oncology  All other pertinent systems were reviewed with the patient and are negative.  PHYSICAL EXAMINATION:    Onc Performance Status - 04/05/24 0857       ECOG Perf Status   ECOG Perf Status Fully active, able to carry on all pre-disease performance without restriction      KPS SCALE   KPS % SCORE Normal, no compliants, no evidence of disease          Vitals:   04/05/24 0853  BP: 132/86  Pulse: 70  Resp: 18  Temp: 98.3 F (36.8 C)  SpO2: 98%   Filed Weights   04/05/24 0853  Weight: (!) 396 lb 11.2 oz (179.9 kg)    Physical Exam Constitutional:      General: He is not in acute distress.    Appearance: Normal appearance.  HENT:     Head: Normocephalic and atraumatic.   Cardiovascular:     Rate and Rhythm: Normal rate.  Pulmonary:     Effort: Pulmonary effort is normal.  Abdominal:     General: There is no distension.   Neurological:     General: No focal deficit present.     Mental Status: He is alert and oriented to person, place, and time.   Psychiatric:        Mood and Affect: Mood normal.        Behavior: Behavior normal.     LABORATORY DATA:   I have reviewed the data as listed.  Results for orders placed or performed in visit on 04/05/24  Lactate dehydrogenase  Result Value Ref Range   LDH 163 98 - 192 U/L  CMP (Cancer Center only)  Result Value Ref Range   Sodium 139 135 - 145 mmol/L   Potassium 4.2 3.5 - 5.1 mmol/L   Chloride 99 98 - 111 mmol/L   CO2 28 22 - 32 mmol/L   Glucose, Bld 132 (H) 70 - 99 mg/dL   BUN 14 8 -  23 mg/dL   Creatinine 1.61 0.96 - 1.24 mg/dL   Calcium  9.9 8.9 - 10.3 mg/dL   Total Protein 6.9 6.5 - 8.1 g/dL   Albumin 4.1 3.5 - 5.0 g/dL   AST 27 15 - 41 U/L   ALT 29  0 - 44 U/L   Alkaline Phosphatase 92 38 - 126 U/L   Total Bilirubin 0.6 0.0 - 1.2 mg/dL   GFR, Estimated >16 >10 mL/min   Anion gap 12 5 - 15  CBC with Differential (Cancer Center Only)  Result Value Ref Range   WBC Count 10.6 (H) 4.0 - 10.5 K/uL   RBC 4.15 (L) 4.22 - 5.81 MIL/uL   Hemoglobin 12.0 (L) 13.0 - 17.0 g/dL   HCT 96.0 (L) 45.4 - 09.8 %   MCV 86.7 80.0 - 100.0 fL   MCH 28.9 26.0 - 34.0 pg   MCHC 33.3 30.0 - 36.0 g/dL   RDW 11.9 14.7 - 82.9 %   Platelet Count 236 150 - 400 K/uL   nRBC 0.0 0.0 - 0.2 %   Neutrophils Relative % 65 %   Neutro Abs 7.1 1.7 - 7.7 K/uL   Lymphocytes Relative 22 %   Lymphs Abs 2.3 0.7 - 4.0 K/uL   Monocytes Relative 9 %   Monocytes Absolute 1.0 0.1 - 1.0 K/uL   Eosinophils Relative 2 %   Eosinophils Absolute 0.2 0.0 - 0.5 K/uL   Basophils Relative 1 %   Basophils Absolute 0.1 0.0 - 0.1 K/uL   Immature Granulocytes 1 %   Abs Immature Granulocytes 0.05 0.00 - 0.07 K/uL    RADIOGRAPHIC STUDIES:  No recent pertinent imaging studies available to review.  Orders Placed This Encounter  Procedures   CBC with Differential (Cancer Center Only)    Standing Status:   Future    Expected Date:   01/31/2025    Expiration Date:   04/05/2025   CMP (Cancer Center only)    Standing Status:   Future    Expected Date:   01/31/2025    Expiration Date:   04/05/2025   Lactate dehydrogenase    Standing Status:   Future    Expected Date:   01/31/2025    Expiration Date:   04/05/2025   C-reactive protein    Standing Status:   Future    Expected Date:   01/31/2025    Expiration Date:   04/05/2025   Sedimentation rate    Standing Status:   Future    Expected Date:   01/31/2025    Expiration Date:   04/05/2025     Future Appointments  Date Time Provider Department Center  06/10/2024  9:30  AM Minor Amble [facility] H[facility]V  01/31/2025 11:15 AM [facility] PHLEBOTOMIST [facility] None  01/31/2025 11:40 AM Shatarra Wehling, Gale Jude, MD [facility] None     This document was completed utilizing speech recognition software. Grammatical errors, random word insertions, pronoun errors, and incomplete sentences are an occasional consequence of this system due to software limitations, ambient noise, and hardware issues. Any formal questions or concerns about the content, text or information contained within the body of this dictation should be directly addressed to the provider for clarification.

## 2024-04-05 NOTE — Assessment & Plan Note (Signed)
 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 Given the long-standing nature and stability, a bone marrow disorder is unlikely. Chronic inflammation from arthritis is a possible contributing factor.   On his initial consultation with us  on 11/03/2023, CBC showed white count of 11,500 with normal differential.  Hemoglobin 13.8, platelet count normal at 313,000.  CMP was grossly unremarkable.  Sed rate was elevated at 25 mm/h.  CRP, LDH, were within normal limits.  BCR/ABL 1 negative.  JAK2 mutation, CALR, MPL mutations were all negative.  Flow cytometry of peripheral blood was unremarkable. ANA and Rheumatoid factor were negative.   On 03/07/2024, labs at his PCPs office showed white count of 19,500 with ANC of 13,300, ALC of 4300, monocyte count of 1600.  Hemoglobin and platelets were normal.  Hence he was seen in clinic for evaluation sooner.  He was originally scheduled to come back to see us  in April 2026.  Repeat labs today showed stable white count of 10,600 with normal differential.  CMP, LDH unremarkable.  There are no associated symptoms such as fevers, chills, night sweats, diarrhea, or urinary issues.  CRP and ESR are pending.  Previously elevated.  Chronic leukocytosis seems to be reactive to inflammation and intermittent dental infection.  No acute bone marrow pathology noted on workup.  He was provided reassurance.  Will continue surveillance alone.    - Schedule follow-up in one year to monitor white blood cell count. - Discharge from hematology and oncology care if counts remain stable at the next visit.

## 2024-04-06 LAB — FLOW CYTOMETRY

## 2024-06-10 ENCOUNTER — Ambulatory Visit (HOSPITAL_COMMUNITY)
Admission: RE | Admit: 2024-06-10 | Discharge: 2024-06-10 | Disposition: A | Discharge status: Home / Self Care | Payer: Medicare Other | Source: Ambulatory Visit | Attending: Internal Medicine | Admitting: Internal Medicine

## 2024-06-10 VITALS — BP 122/82 | HR 69 | Ht 70.0 in | Wt >= 6400 oz

## 2024-06-10 DIAGNOSIS — I4891 Unspecified atrial fibrillation: Secondary | ICD-10-CM | POA: Diagnosis present

## 2024-06-10 DIAGNOSIS — I4819 Other persistent atrial fibrillation: Secondary | ICD-10-CM | POA: Insufficient documentation

## 2024-06-10 DIAGNOSIS — D6869 Other thrombophilia: Secondary | ICD-10-CM | POA: Diagnosis present

## 2024-06-10 MED ORDER — RIVAROXABAN 20 MG PO TABS: 20.0000 mg | ORAL_TABLET | Freq: Every day | ORAL | 2 refills | Status: AC

## 2024-06-10 NOTE — Progress Notes (Signed)
 Primary Care Physician: Pia Kerney SQUIBB, MD Primary Cardiologist: None Electrophysiologist: None     Referring Physician: Dr. Lafe Glendia JONELLE Courtland is a 68 y.o. male with a history of HTN, TIA in 2019, fatty liver disease, obesity, dilated aortic root, OSA not on CPAP, and paroxysmal atrial fibrillation who presents for consultation in the [facility]. Hospital admission 10/20/23-10/21/23 for chest pain found have flu type A and new onset Afib (suspect persistent). Patient is on Eliquis  5 mg BID for a CHADS2VASC score of 4.  On evaluation today, he is currently in Afib. Patient drinks decaf coffee, does not drink any alcohol, and states he does not snore. He admits to me since hospital discharge he has been cutting his Eliquis  in half and taking half tablet twice daily since day of discharge.   On follow up 12/11/23, he is currently in NSR. S/p successful DCCV on 12/04/23. No missed doses of Eliquis  5 mg BID. He notes to always have an elevated pulse. Patient and wife note when he would previously sleep on his back she would have to wake him because it sounded like he stopped breathing. Patient tells me he sleeps on his side and no longer does this. He feels like the Eliquis  is making him sleepy.   On follow up 06/10/24, patient is currently in Afib. Patient notes via Kardiamobile device that he was paroxysmal in terms of Afib burden since our last OV. He notes over the past two months to be persistently in it. No bleeding issues on Xarelto .   Today, he denies symptoms of palpitations, chest pain, shortness of breath, orthopnea, PND, lower extremity edema, dizziness, presyncope, syncope, snoring, daytime somnolence, bleeding, or neurologic sequela. The patient is tolerating medications without difficulties and is otherwise without complaint today.    Atrial Fibrillation Risk Factors:  he does have symptoms or diagnosis of sleep apnea. he is compliant with CPAP  therapy.  he has a BMI of Body mass index is 57.71 kg/m.SABRA Filed Weights   06/10/24 0925  Weight: (!) 182.4 kg     Current Outpatient Medications  Medication Sig Dispense Refill   atorvastatin  (LIPITOR) 40 MG tablet Take 1 tablet (40 mg total) by mouth daily. 90 tablet 4   Cholecalciferol (VITAMIN D3) 125 MCG (5000 UT) TABS Take 5,000 Units by mouth daily.     Collagen-Vitamin C-Biotin (COLLAGEN PO) Take 3 capsules by mouth daily. 5000 units total     furosemide  (LASIX ) 40 MG tablet TAKE 1 TABLET DAILY 90 tablet 1   losartan  (COZAAR ) 25 MG tablet Take 1 tablet (25 mg total) by mouth daily. 90 tablet 2   metoprolol  succinate (TOPROL  XL) 25 MG 24 hr tablet Take 1 tablet (25 mg total) by mouth daily. Take along with 100mg  tablet to equal 125mg  daily 90 tablet 2   metoprolol  succinate (TOPROL -XL) 100 MG 24 hr tablet Take 1 tablet (100 mg total) by mouth daily. Take along with 25mg  tablet to equal 125mg  90 tablet 2   metoprolol  succinate (TOPROL -XL) 25 MG 24 hr tablet Take 25 mg by mouth daily.     Multiple Vitamins-Minerals (ONE-A-DAY MENS 50+) TABS Take 1 tablet by mouth daily.     omeprazole  (PRILOSEC  OTC) 20 MG tablet Take 20 mg by mouth in the morning.     omeprazole  (PRILOSEC ) 20 MG capsule Take 20 mg by mouth daily.     rivaroxaban  (XARELTO ) 20 MG TABS tablet Take 1 tablet (20  mg total) by mouth daily with supper. 90 tablet 2   traMADol (ULTRAM) 50 MG tablet Take 50 mg by mouth every 6 (six) hours as needed (Arthritis). (Patient taking differently: Take 50 mg by mouth 2 (two) times daily.)     No current facility-administered medications for this encounter.    Atrial Fibrillation Management history:  Previous antiarrhythmic drugs: none Previous cardioversions: 12/04/23 Previous ablations: none Anticoagulation history: Eliquis    ROS- All systems are reviewed and negative except as per the HPI above.  Physical Exam: BP 122/82   Pulse 69   Ht 5' 10 (1.778 m)   Wt (!) 182.4 kg    BMI 57.71 kg/m   GEN- The patient is well appearing, alert and oriented x 3 today.   Neck - no JVD or carotid bruit noted Lungs- Clear to ausculation bilaterally, normal work of breathing Heart- Irregular rate and rhythm, no murmurs, rubs or gallops, PMI not laterally displaced Extremities- no clubbing, cyanosis, or edema Skin - no rash or ecchymosis noted   EKG today demonstrates  Vent. rate 69 BPM PR interval * ms QRS duration 128 ms QT/QTcB 446/477 ms P-R-T axes * 85 66 Atrial fibrillation Right bundle branch block Septal infarct , age undetermined Abnormal ECG When compared with ECG of 11-Dec-2023 09:10, Atrial fibrillation has replaced Sinus rhythm   Echo 10/21/23 demonstrated  1. Left ventricular ejection fraction, by estimation, is 60 to 65%. The  left ventricle has normal function. Left ventricular endocardial border  not optimally defined to evaluate regional wall motion. not well  visualized left ventricular hypertrophy.  Left ventricular diastolic parameters are indeterminate.   2. Right ventricular systolic function was not well visualized. The right  ventricular size is not well visualized. Tricuspid regurgitation signal is  inadequate for assessing PA pressure.   3. The mitral valve was not well visualized. Trivial mitral valve  regurgitation. No evidence of mitral stenosis.   4. The aortic valve was not well visualized. Aortic valve regurgitation  is not visualized. No aortic stenosis is present.   5. Aortic dilatation noted. There is mild dilatation of the ascending  aorta, measuring 41 mm.   6. Technically difficult study, limited visualization    ASSESSMENT & PLAN CHA2DS2-VASc Score = 4  The patient's score is based upon: CHF History: 0 HTN History: 1 Diabetes History: 0 Stroke History: 2 Vascular Disease History: 0 Age Score: 1 Gender Score: 0      ASSESSMENT AND PLAN: Persistent Atrial Fibrillation (ICD10:  I48.19) The patient's  CHA2DS2-VASc score is 4, indicating a 4.8% annual risk of stroke.   S/p successful DCCV on 12/04/23.   He is currently in Afib. We discussed validity of Kardiamobile device and in the future to contact clinic if notes Afib burden. We discussed the procedure cardioversion to try to convert to NSR. We discussed the risks vs benefits of this procedure and how ultimately we cannot predict whether a patient will have early return of arrhythmia post procedure. After discussion, the patient wishes to proceed with cardioversion. Labs drawn today. I did note to patient and wife this will be the second cardioversion in a year. His BMI is prohibitive of ablation. In reviewing his previous ECGs while in sinus rhythm, his baseline PR interval and baseline Qtc may make it difficult to treat with AAD therapy. It would require discussion with EP for how / if to proceed with AAD therapy.    I reiterated to patient the importance of treating sleep apnea. Prior  records show severe OSA. Will refer for sleep study.  Continue Toprol  125 mg daily.   Secondary Hypercoagulable State (ICD10:  D68.69) The patient is at significant risk for stroke/thromboembolism based upon his CHA2DS2-VASc Score of 4.   No missed doses of Xarelto .   In reviewing patient's CHADSVASC score, he believes diagnosis of TIA in 2019 was incorrect and he did not have one.    Follow up 2 weeks after DCCV.   Terra Pac, PA-C  [facility] [address] [place], [place] [address] [phone]

## 2024-06-10 NOTE — Patient Instructions (Addendum)
 [facility] scheduling will call once insurance authorization received.   Cardioversion scheduled for:  07/17/24 Thurs 10:00am   - Arrive at the [facility] ([address])  and check in with ADMITTING at 10:00am   - Do not eat or drink anything after midnight the night prior to your procedure.   - Take all your morning medication (except diabetic medications) with a sip of water prior to arrival.  - Do NOT miss any doses of your blood thinner - if you should miss a dose or take a dose more than 4 hours late -- please notify our office immediately.  - You will not be able to drive home after your procedure. Please ensure you have a responsible adult to drive you home. You will need someone with you for 24 hours post procedure.     - Expect to be in the procedural area approximately 2 hours.   - If you feel as if you go back into normal rhythm prior to scheduled cardioversion, please notify our office immediately.   If your procedure is canceled in the cardioversion suite you will be charged a cancellation fee.

## 2024-06-10 NOTE — H&P (View-Only) (Signed)
 Primary Care Physician: Pia Kerney SQUIBB, MD Primary Cardiologist: None Electrophysiologist: None     Referring Physician: Dr. Lafe Glendia JONELLE Courtland is a 68 y.o. male with a history of HTN, TIA in 2019, fatty liver disease, obesity, dilated aortic root, OSA not on CPAP, and paroxysmal atrial fibrillation who presents for consultation in the [facility]. Hospital admission 10/20/23-10/21/23 for chest pain found have flu type A and new onset Afib (suspect persistent). Patient is on Eliquis  5 mg BID for a CHADS2VASC score of 4.  On evaluation today, he is currently in Afib. Patient drinks decaf coffee, does not drink any alcohol, and states he does not snore. He admits to me since hospital discharge he has been cutting his Eliquis  in half and taking half tablet twice daily since day of discharge.   On follow up 12/11/23, he is currently in NSR. S/p successful DCCV on 12/04/23. No missed doses of Eliquis  5 mg BID. He notes to always have an elevated pulse. Patient and wife note when he would previously sleep on his back she would have to wake him because it sounded like he stopped breathing. Patient tells me he sleeps on his side and no longer does this. He feels like the Eliquis  is making him sleepy.   On follow up 06/10/24, patient is currently in Afib. Patient notes via Kardiamobile device that he was paroxysmal in terms of Afib burden since our last OV. He notes over the past two months to be persistently in it. No bleeding issues on Xarelto .   Today, he denies symptoms of palpitations, chest pain, shortness of breath, orthopnea, PND, lower extremity edema, dizziness, presyncope, syncope, snoring, daytime somnolence, bleeding, or neurologic sequela. The patient is tolerating medications without difficulties and is otherwise without complaint today.    Atrial Fibrillation Risk Factors:  he does have symptoms or diagnosis of sleep apnea. he is compliant with CPAP  therapy.  he has a BMI of Body mass index is 57.71 kg/m.SABRA Filed Weights   06/10/24 0925  Weight: (!) 182.4 kg     Current Outpatient Medications  Medication Sig Dispense Refill   atorvastatin  (LIPITOR) 40 MG tablet Take 1 tablet (40 mg total) by mouth daily. 90 tablet 4   Cholecalciferol (VITAMIN D3) 125 MCG (5000 UT) TABS Take 5,000 Units by mouth daily.     Collagen-Vitamin C-Biotin (COLLAGEN PO) Take 3 capsules by mouth daily. 5000 units total     furosemide  (LASIX ) 40 MG tablet TAKE 1 TABLET DAILY 90 tablet 1   losartan  (COZAAR ) 25 MG tablet Take 1 tablet (25 mg total) by mouth daily. 90 tablet 2   metoprolol  succinate (TOPROL  XL) 25 MG 24 hr tablet Take 1 tablet (25 mg total) by mouth daily. Take along with 100mg  tablet to equal 125mg  daily 90 tablet 2   metoprolol  succinate (TOPROL -XL) 100 MG 24 hr tablet Take 1 tablet (100 mg total) by mouth daily. Take along with 25mg  tablet to equal 125mg  90 tablet 2   metoprolol  succinate (TOPROL -XL) 25 MG 24 hr tablet Take 25 mg by mouth daily.     Multiple Vitamins-Minerals (ONE-A-DAY MENS 50+) TABS Take 1 tablet by mouth daily.     omeprazole  (PRILOSEC  OTC) 20 MG tablet Take 20 mg by mouth in the morning.     omeprazole  (PRILOSEC ) 20 MG capsule Take 20 mg by mouth daily.     rivaroxaban  (XARELTO ) 20 MG TABS tablet Take 1 tablet (20  mg total) by mouth daily with supper. 90 tablet 2   traMADol (ULTRAM) 50 MG tablet Take 50 mg by mouth every 6 (six) hours as needed (Arthritis). (Patient taking differently: Take 50 mg by mouth 2 (two) times daily.)     No current facility-administered medications for this encounter.    Atrial Fibrillation Management history:  Previous antiarrhythmic drugs: none Previous cardioversions: 12/04/23 Previous ablations: none Anticoagulation history: Eliquis    ROS- All systems are reviewed and negative except as per the HPI above.  Physical Exam: BP 122/82   Pulse 69   Ht 5' 10 (1.778 m)   Wt (!) 182.4 kg    BMI 57.71 kg/m   GEN- The patient is well appearing, alert and oriented x 3 today.   Neck - no JVD or carotid bruit noted Lungs- Clear to ausculation bilaterally, normal work of breathing Heart- Irregular rate and rhythm, no murmurs, rubs or gallops, PMI not laterally displaced Extremities- no clubbing, cyanosis, or edema Skin - no rash or ecchymosis noted   EKG today demonstrates  Vent. rate 69 BPM PR interval * ms QRS duration 128 ms QT/QTcB 446/477 ms P-R-T axes * 85 66 Atrial fibrillation Right bundle branch block Septal infarct , age undetermined Abnormal ECG When compared with ECG of 11-Dec-2023 09:10, Atrial fibrillation has replaced Sinus rhythm   Echo 10/21/23 demonstrated  1. Left ventricular ejection fraction, by estimation, is 60 to 65%. The  left ventricle has normal function. Left ventricular endocardial border  not optimally defined to evaluate regional wall motion. not well  visualized left ventricular hypertrophy.  Left ventricular diastolic parameters are indeterminate.   2. Right ventricular systolic function was not well visualized. The right  ventricular size is not well visualized. Tricuspid regurgitation signal is  inadequate for assessing PA pressure.   3. The mitral valve was not well visualized. Trivial mitral valve  regurgitation. No evidence of mitral stenosis.   4. The aortic valve was not well visualized. Aortic valve regurgitation  is not visualized. No aortic stenosis is present.   5. Aortic dilatation noted. There is mild dilatation of the ascending  aorta, measuring 41 mm.   6. Technically difficult study, limited visualization    ASSESSMENT & PLAN CHA2DS2-VASc Score = 4  The patient's score is based upon: CHF History: 0 HTN History: 1 Diabetes History: 0 Stroke History: 2 Vascular Disease History: 0 Age Score: 1 Gender Score: 0      ASSESSMENT AND PLAN: Persistent Atrial Fibrillation (ICD10:  I48.19) The patient's  CHA2DS2-VASc score is 4, indicating a 4.8% annual risk of stroke.   S/p successful DCCV on 12/04/23.   He is currently in Afib. We discussed validity of Kardiamobile device and in the future to contact clinic if notes Afib burden. We discussed the procedure cardioversion to try to convert to NSR. We discussed the risks vs benefits of this procedure and how ultimately we cannot predict whether a patient will have early return of arrhythmia post procedure. After discussion, the patient wishes to proceed with cardioversion. Labs drawn today. I did note to patient and wife this will be the second cardioversion in a year. His BMI is prohibitive of ablation. In reviewing his previous ECGs while in sinus rhythm, his baseline PR interval and baseline Qtc may make it difficult to treat with AAD therapy. It would require discussion with EP for how / if to proceed with AAD therapy.    I reiterated to patient the importance of treating sleep apnea. Prior  records show severe OSA. Will refer for sleep study.  Continue Toprol  125 mg daily.   Secondary Hypercoagulable State (ICD10:  D68.69) The patient is at significant risk for stroke/thromboembolism based upon his CHA2DS2-VASc Score of 4.   No missed doses of Xarelto .   In reviewing patient's CHADSVASC score, he believes diagnosis of TIA in 2019 was incorrect and he did not have one.    Follow up 2 weeks after DCCV.   Terra Pac, PA-C  [facility] [address] [place], [place] [address] [phone]

## 2024-06-10 NOTE — Addendum Note (Signed)
 Encounter addended by: Janel Nancy SAUNDERS, RN on: 06/10/2024 10:55 AM  Actions taken: Order list changed

## 2024-06-10 NOTE — Addendum Note (Signed)
 Encounter addended by: Ezzard Alan FALCON, CMA on: 06/10/2024 1:20 PM  Actions taken: Order list changed, Medication long-term status modified

## 2024-06-15 NOTE — Progress Notes (Signed)
 Pt returned phone call after receiving message. This RN verified pt is still taking Xarelto  w/ no missed doses, that pt has a ride home post procedure, and to remain NPO after midnight. Pt verbalized understanding.

## 2024-06-15 NOTE — Progress Notes (Signed)
 Left message for return call to review instructions for appointment 06/16/24.

## 2024-06-16 ENCOUNTER — Ambulatory Visit (HOSPITAL_COMMUNITY): Admitting: Anesthesiology

## 2024-06-16 ENCOUNTER — Ambulatory Visit (HOSPITAL_BASED_OUTPATIENT_CLINIC_OR_DEPARTMENT_OTHER): Admitting: Anesthesiology

## 2024-06-16 ENCOUNTER — Ambulatory Visit (HOSPITAL_COMMUNITY)
Admission: RE | Admit: 2024-06-16 | Discharge: 2024-06-16 | Disposition: A | Discharge status: Home / Self Care | Attending: Cardiovascular Disease | Admitting: Cardiovascular Disease

## 2024-06-16 ENCOUNTER — Encounter (HOSPITAL_COMMUNITY)
Admission: RE | Disposition: A | Discharge status: Home / Self Care | Payer: Self-pay | Source: Home / Self Care | Attending: Cardiovascular Disease

## 2024-06-16 ENCOUNTER — Other Ambulatory Visit: Payer: Self-pay

## 2024-06-16 ENCOUNTER — Encounter (HOSPITAL_COMMUNITY): Payer: Self-pay | Admitting: Cardiovascular Disease

## 2024-06-16 DIAGNOSIS — I4891 Unspecified atrial fibrillation: Secondary | ICD-10-CM

## 2024-06-16 DIAGNOSIS — Z8673 Personal history of transient ischemic attack (TIA), and cerebral infarction without residual deficits: Secondary | ICD-10-CM | POA: Diagnosis not present

## 2024-06-16 DIAGNOSIS — Z7901 Long term (current) use of anticoagulants: Secondary | ICD-10-CM | POA: Diagnosis not present

## 2024-06-16 DIAGNOSIS — I509 Heart failure, unspecified: Secondary | ICD-10-CM | POA: Diagnosis not present

## 2024-06-16 DIAGNOSIS — K219 Gastro-esophageal reflux disease without esophagitis: Secondary | ICD-10-CM | POA: Diagnosis not present

## 2024-06-16 DIAGNOSIS — G4730 Sleep apnea, unspecified: Secondary | ICD-10-CM

## 2024-06-16 DIAGNOSIS — I10 Essential (primary) hypertension: Secondary | ICD-10-CM

## 2024-06-16 DIAGNOSIS — I110 Hypertensive heart disease with heart failure: Secondary | ICD-10-CM | POA: Insufficient documentation

## 2024-06-16 DIAGNOSIS — G4733 Obstructive sleep apnea (adult) (pediatric): Secondary | ICD-10-CM | POA: Diagnosis not present

## 2024-06-16 DIAGNOSIS — Z87891 Personal history of nicotine dependence: Secondary | ICD-10-CM | POA: Diagnosis not present

## 2024-06-16 DIAGNOSIS — I4819 Other persistent atrial fibrillation: Secondary | ICD-10-CM | POA: Insufficient documentation

## 2024-06-16 DIAGNOSIS — D6869 Other thrombophilia: Secondary | ICD-10-CM | POA: Insufficient documentation

## 2024-06-16 HISTORY — PX: CARDIOVERSION: EP1203

## 2024-06-16 SURGERY — CARDIOVERSION (CATH LAB)
Anesthesia: General

## 2024-06-16 MED ORDER — SODIUM CHLORIDE 0.9% FLUSH
INTRAVENOUS | Status: DC | PRN
  Administered 2024-06-16 (×3): 3 mL via INTRAVENOUS

## 2024-06-16 MED ORDER — PROPOFOL 10 MG/ML IV BOLUS
INTRAVENOUS | Status: DC | PRN
  Administered 2024-06-16: 30 mg via INTRAVENOUS
  Administered 2024-06-16: 60 mg via INTRAVENOUS
  Administered 2024-06-16: 30 mg via INTRAVENOUS

## 2024-06-16 MED ORDER — LIDOCAINE 2% (20 MG/ML) 5 ML SYRINGE
INTRAMUSCULAR | Status: DC | PRN
  Administered 2024-06-16: 60 mg via INTRAVENOUS

## 2024-06-16 SURGICAL SUPPLY — 1 items: PAD DEFIB RADIO PHYSIO CONN (PAD) ×2 IMPLANT

## 2024-06-16 NOTE — Transfer of Care (Signed)
 Immediate Anesthesia Transfer of Care Note  Patient: Ryan [name]  Procedure(s) Performed: CARDIOVERSION  Patient Location: Cath Lab  Anesthesia Type:General  Level of Consciousness: sedated, drowsy, and patient cooperative  Airway & Oxygen Therapy: Patient Spontanous Breathing and Patient connected to face mask oxygen  Post-op Assessment: Report given to RN and Post -op Vital signs reviewed and stable  Post vital signs: Reviewed and stable  Last Vitals:  Vitals Value Taken Time  BP 138/85 10:13  Temp    Pulse 69 06/16/24 10:13  Resp 15 06/16/24 10:13  SpO2 96 % 06/16/24 10:13  Vitals shown include unfiled device data.  Last Pain:  Vitals:   06/16/24 0957  TempSrc:   PainSc: 0-No pain         Complications: No notable events documented.

## 2024-06-16 NOTE — Discharge Instructions (Signed)

## 2024-06-16 NOTE — CV Procedure (Signed)
 Electrical Cardioversion Procedure Note Ryan [name] [id number] [birth year]  Procedure: Electrical Cardioversion Indications:  Atrial Fibrillation  Procedure Details Consent: Risks of procedure as well as the alternatives and risks of each were explained to the (patient/caregiver).  Consent for procedure obtained. Time Out: Verified patient identification, verified procedure, site/side was marked, verified correct patient position, special equipment/implants available, medications/allergies/relevent history reviewed, required imaging and test results available.  Performed  Patient placed on cardiac monitor, pulse oximetry, supplemental oxygen as necessary.  Sedation given: propofol  Pacer pads placed anterior and posterior chest.  Cardioverted 2 time(s).  Cardioverted at 360J unsuccessful.  360J with pressure applied successful.  Evaluation Findings: Post procedure EKG shows: NSR Complications: None Patient did tolerate procedure well.   Ryan Scarce, MD 06/16/2024, 10:27 AM

## 2024-06-16 NOTE — Interval H&P Note (Signed)
 History and Physical Interval Note:  06/16/2024 10:18 AM  Ryan [name]  has presented today for surgery, with the diagnosis of AFIB.  The various methods of treatment have been discussed with the patient and family. After consideration of risks, benefits and other options for treatment, the patient has consented to  Procedure(s): CARDIOVERSION (N/A) as a surgical intervention.  The patient's history has been reviewed, patient examined, no change in status, stable for surgery.  I have reviewed the patient's chart and labs.  Questions were answered to the patient's satisfaction.     [name], MD

## 2024-06-16 NOTE — Anesthesia Postprocedure Evaluation (Signed)
 Anesthesia Post Note  Patient: Humphrey R Milliron  Procedure(s) Performed: CARDIOVERSION     Patient location during evaluation: Cath Lab Anesthesia Type: General Level of consciousness: awake and alert Pain management: pain level controlled Vital Signs Assessment: post-procedure vital signs reviewed and stable Respiratory status: spontaneous breathing, nonlabored ventilation and respiratory function stable Cardiovascular status: blood pressure returned to baseline and stable Postop Assessment: no apparent nausea or vomiting Anesthetic complications: no   No notable events documented.  Last Vitals:  Vitals:   06/16/24 1038 06/16/24 1048  BP: (!) 154/100 (!) 154/103  Pulse: 75 87  Resp: 18 14  Temp:    SpO2: 95% 95%    Last Pain:  Vitals:   06/16/24 1048  TempSrc:   PainSc: 0-No pain                 Garnette FORBES Skillern

## 2024-06-16 NOTE — Anesthesia Preprocedure Evaluation (Signed)
 Anesthesia Evaluation  Patient identified by MRN, date of birth, ID band Patient awake    Reviewed: Allergy & Precautions, NPO status , Patient's Chart, lab work & pertinent test results, reviewed documented beta blocker date and time   Airway Mallampati: III  TM Distance: >3 FB Neck ROM: Full    Dental  (+) Dental Advisory Given, Missing   Pulmonary sleep apnea , former smoker   Pulmonary exam normal breath sounds clear to auscultation       Cardiovascular hypertension, Pt. on medications and Pt. on home beta blockers (-) angina (-) Past MI  Rhythm:Irregular Rate:Abnormal     Neuro/Psych  PSYCHIATRIC DISORDERS Anxiety     TIA   GI/Hepatic Neg liver ROS,GERD  Medicated,,  Endo/Other    Class 4 obesity  Renal/GU negative Renal ROS     Musculoskeletal  (+) Arthritis ,    Abdominal   Peds  Hematology negative hematology ROS (+)   Anesthesia Other Findings Day of surgery medications reviewed with the patient.  Reproductive/Obstetrics                              Anesthesia Physical Anesthesia Plan  ASA: 4  Anesthesia Plan: General   Post-op Pain Management: Minimal or no pain anticipated   Induction: Intravenous  PONV Risk Score and Plan: 2 and TIVA and Treatment may vary due to age or medical condition  Airway Management Planned: Natural Airway and Simple Face Mask  Additional Equipment:   Intra-op Plan:   Post-operative Plan:   Informed Consent: I have reviewed the patients History and Physical, chart, labs and discussed the procedure including the risks, benefits and alternatives for the proposed anesthesia with the patient or authorized representative who has indicated his/her understanding and acceptance.     Dental advisory given  Plan Discussed with: CRNA  Anesthesia Plan Comments:         Anesthesia Quick Evaluation

## 2024-06-22 ENCOUNTER — Telehealth (HOSPITAL_COMMUNITY): Payer: Self-pay | Admitting: *Deleted

## 2024-06-22 NOTE — Telephone Encounter (Signed)
 Pt called reporting to be back in Afib since 06/27/24 with HR 70-90 asymptomatic. Discussed with Daril Kicks PA. No med changes at this time. Recommends to keep f/u appt 07/01/24. Pt agrees with plan and will call back if needed before then.

## 2024-07-01 ENCOUNTER — Ambulatory Visit (HOSPITAL_COMMUNITY)
Admission: RE | Admit: 2024-07-01 | Discharge: 2024-07-01 | Disposition: A | Discharge status: Home / Self Care | Source: Ambulatory Visit | Attending: Internal Medicine | Admitting: Internal Medicine

## 2024-07-01 ENCOUNTER — Encounter (HOSPITAL_COMMUNITY): Payer: Self-pay | Admitting: Internal Medicine

## 2024-07-01 VITALS — BP 130/98 | HR 90 | Ht 71.0 in | Wt 395.4 lb

## 2024-07-01 DIAGNOSIS — D6869 Other thrombophilia: Secondary | ICD-10-CM | POA: Insufficient documentation

## 2024-07-01 DIAGNOSIS — I4819 Other persistent atrial fibrillation: Secondary | ICD-10-CM | POA: Diagnosis present

## 2024-07-01 MED ORDER — DRONEDARONE HCL 400 MG PO TABS: 400.0000 mg | ORAL_TABLET | Freq: Two times a day (BID) | ORAL | 6 refills | Status: DC

## 2024-07-01 NOTE — H&P (View-Only) (Signed)
 Primary Care Physician: Pia Kerney SQUIBB, MD Primary Cardiologist: None Electrophysiologist: None     Referring Physician: Dr. Lafe Glendia JONELLE Courtland is a 68 y.o. male with a history of HTN, TIA in 2019, fatty liver disease, obesity, dilated aortic root, OSA not on CPAP, and paroxysmal atrial fibrillation who presents for consultation in the [facility]. Hospital admission 10/20/23-10/21/23 for chest pain found have flu type A and new onset Afib (suspect persistent). Patient is on Eliquis  5 mg BID for a CHADS2VASC score of 4.  On evaluation today, he is currently in Afib. Patient drinks decaf coffee, does not drink any alcohol, and states he does not snore. He admits to me since hospital discharge he has been cutting his Eliquis  in half and taking half tablet twice daily since day of discharge.   On follow up 12/11/23, he is currently in NSR. S/p successful DCCV on 12/04/23. No missed doses of Eliquis  5 mg BID. He notes to always have an elevated pulse. Patient and wife note when he would previously sleep on his back she would have to wake him because it sounded like he stopped breathing. Patient tells me he sleeps on his side and no longer does this. He feels like the Eliquis  is making him sleepy.   On follow up 06/10/24, patient is currently in Afib. Patient notes via Kardiamobile device that he was paroxysmal in terms of Afib burden since our last OV. He notes over the past two months to be persistently in it. No bleeding issues on Xarelto .   On follow up 07/01/24, patient is currently in Afib. S/p successful DCCV on 8/28 x 2 shocks at 360J. He does acknowledge he felt much better for the 5 days he was in normal rhythm. No missed doses of Xarelto .   Today, he denies symptoms of palpitations, chest pain, shortness of breath, orthopnea, PND, lower extremity edema, dizziness, presyncope, syncope, snoring, daytime somnolence, bleeding, or neurologic sequela. The patient  is tolerating medications without difficulties and is otherwise without complaint today.    Atrial Fibrillation Risk Factors:  he does have symptoms or diagnosis of sleep apnea. he is compliant with CPAP therapy.  he has a BMI of Body mass index is 55.15 kg/m.SABRA Filed Weights   07/01/24 1026  Weight: (!) 179.4 kg    Current Outpatient Medications  Medication Sig Dispense Refill   atorvastatin  (LIPITOR) 40 MG tablet Take 1 tablet (40 mg total) by mouth daily. 90 tablet 4   Cholecalciferol (VITAMIN D3) 125 MCG (5000 UT) TABS Take 5,000 Units by mouth daily.     Collagen-Vitamin C-Biotin (COLLAGEN PO) Take 3 capsules by mouth daily. 5000 units total     furosemide  (LASIX ) 20 MG tablet Take 20 mg by mouth daily.     furosemide  (LASIX ) 40 MG tablet TAKE 1 TABLET DAILY 90 tablet 1   losartan  (COZAAR ) 25 MG tablet Take 1 tablet (25 mg total) by mouth daily. 90 tablet 2   Magnesium  Glycinate 100 MG CAPS Take 100 tablets by mouth daily.     metoprolol  succinate (TOPROL  XL) 25 MG 24 hr tablet Take 1 tablet (25 mg total) by mouth daily. Take along with 100mg  tablet to equal 125mg  daily 90 tablet 2   metoprolol  succinate (TOPROL -XL) 100 MG 24 hr tablet Take 1 tablet (100 mg total) by mouth daily. Take along with 25mg  tablet to equal 125mg  90 tablet 2   Multiple Vitamins-Minerals (ONE-A-DAY MENS 50+) TABS  Take 1 tablet by mouth daily. Silver 50+     omeprazole  (PRILOSEC ) 20 MG capsule Take 20 mg by mouth daily.     rivaroxaban  (XARELTO ) 20 MG TABS tablet Take 1 tablet (20 mg total) by mouth daily with supper. 90 tablet 2   traMADol (ULTRAM) 50 MG tablet Take 50 mg by mouth every 6 (six) hours as needed (Arthritis). (Patient taking differently: Take 50 mg by mouth 2 (two) times daily.)     No current facility-administered medications for this encounter.    Atrial Fibrillation Management history:  Previous antiarrhythmic drugs: none Previous cardioversions: 12/04/23, 06/16/24 Previous ablations:  none Anticoagulation history: Eliquis    ROS- All systems are reviewed and negative except as per the HPI above.  Physical Exam: BP (!) 130/98   Pulse 90   Ht 5' 11 (1.803 m)   Wt (!) 179.4 kg   BMI 55.15 kg/m   GEN- The patient is well appearing, alert and oriented x 3 today.   Neck - no JVD or carotid bruit noted Lungs- Clear to ausculation bilaterally, normal work of breathing Heart- Irregular rate and rhythm, no murmurs, rubs or gallops, PMI not laterally displaced Extremities- no clubbing, cyanosis, or edema Skin - no rash or ecchymosis noted   EKG today demonstrates  Vent. rate 90 BPM PR interval * ms QRS duration 134 ms QT/QTcB 428/523 ms P-R-T axes * 76 20 Atrial fibrillation Right bundle branch block Abnormal ECG When compared with ECG of 16-Jun-2024 10:29, Atrial fibrillation has replaced Sinus rhythm   Echo 10/21/23 demonstrated  1. Left ventricular ejection fraction, by estimation, is 60 to 65%. The  left ventricle has normal function. Left ventricular endocardial border  not optimally defined to evaluate regional wall motion. not well  visualized left ventricular hypertrophy.  Left ventricular diastolic parameters are indeterminate.   2. Right ventricular systolic function was not well visualized. The right  ventricular size is not well visualized. Tricuspid regurgitation signal is  inadequate for assessing PA pressure.   3. The mitral valve was not well visualized. Trivial mitral valve  regurgitation. No evidence of mitral stenosis.   4. The aortic valve was not well visualized. Aortic valve regurgitation  is not visualized. No aortic stenosis is present.   5. Aortic dilatation noted. There is mild dilatation of the ascending  aorta, measuring 41 mm.   6. Technically difficult study, limited visualization    ASSESSMENT & PLAN CHA2DS2-VASc Score = 4  The patient's score is based upon: CHF History: 0 HTN History: 1 Diabetes History: 0 Stroke  History: 2 Vascular Disease History: 0 Age Score: 1 Gender Score: 0      ASSESSMENT AND PLAN: Persistent Atrial Fibrillation (ICD10:  I48.19) The patient's CHA2DS2-VASc score is 4, indicating a 4.8% annual risk of stroke.   S/p successful DCCV on 12/04/23.  S/p successful DCCV on 06/16/24 after 2 shocks @ 360J.   Patient is currently in Afib. Continue Toprol  125 mg daily. We discussed rhythm control options due to ERAF. After discussion, patient agrees to begin Multaq  400 mg BID. He will begin this medication on week prior to repeat cardioversion. We discussed the procedure cardioversion to try to convert to NSR. We discussed the risks vs benefits of this procedure and how ultimately we cannot predict whether a patient will have early return of arrhythmia post procedure. After discussion, the patient wishes to proceed with cardioversion. Labs drawn today.    Informed Consent   Shared Decision Making/Informed Consent The risks (stroke,  cardiac arrhythmias rarely resulting in the need for a temporary or permanent pacemaker, skin irritation or burns and complications associated with conscious sedation including aspiration, arrhythmia, respiratory failure and death), benefits (restoration of normal sinus rhythm) and alternatives of a direct current cardioversion were explained in detail to Mr. Zentz and he agrees to proceed.       Secondary Hypercoagulable State (ICD10:  D68.69) The patient is at significant risk for stroke/thromboembolism based upon his CHA2DS2-VASc Score of 4.   Continue Xarelto .      Follow up 2 weeks after DCCV.    Terra Pac, PA-C  Afib Clinic [facility] [address] [place], [place] [address] [phone]

## 2024-07-01 NOTE — Progress Notes (Signed)
 Primary Care Physician: Pia Kerney SQUIBB, MD Primary Cardiologist: None Electrophysiologist: None     Referring Physician: Dr. Lafe Glendia JONELLE [name] is a 68 y.o. male with a history of HTN, TIA in 2019, fatty liver disease, obesity, dilated aortic root, OSA not on CPAP, and paroxysmal atrial fibrillation who presents for consultation in the [facility]. Hospital admission 10/20/23-10/21/23 for chest pain found have flu type A and new onset Afib (suspect persistent). Patient is on Eliquis  5 mg BID for a CHADS2VASC score of 4.  On evaluation today, he is currently in Afib. Patient drinks decaf coffee, does not drink any alcohol, and states he does not snore. He admits to me since hospital discharge he has been cutting his Eliquis  in half and taking half tablet twice daily since day of discharge.   On follow up 12/11/23, he is currently in NSR. S/p successful DCCV on 12/04/23. No missed doses of Eliquis  5 mg BID. He notes to always have an elevated pulse. Patient and wife note when he would previously sleep on his back she would have to wake him because it sounded like he stopped breathing. Patient tells me he sleeps on his side and no longer does this. He feels like the Eliquis  is making him sleepy.   On follow up 06/10/24, patient is currently in Afib. Patient notes via Kardiamobile device that he was paroxysmal in terms of Afib burden since our last OV. He notes over the past two months to be persistently in it. No bleeding issues on Xarelto .   On follow up 07/01/24, patient is currently in Afib. S/p successful DCCV on 8/28 x 2 shocks at 360J. He does acknowledge he felt much better for the 5 days he was in normal rhythm. No missed doses of Xarelto .   Today, he denies symptoms of palpitations, chest pain, shortness of breath, orthopnea, PND, lower extremity edema, dizziness, presyncope, syncope, snoring, daytime somnolence, bleeding, or neurologic sequela. The patient  is tolerating medications without difficulties and is otherwise without complaint today.    Atrial Fibrillation Risk Factors:  he does have symptoms or diagnosis of sleep apnea. he is compliant with CPAP therapy.  he has a BMI of Body mass index is 55.15 kg/m.SABRA Filed Weights   07/01/24 1026  Weight: (!) 179.4 kg    Current Outpatient Medications  Medication Sig Dispense Refill   atorvastatin  (LIPITOR) 40 MG tablet Take 1 tablet (40 mg total) by mouth daily. 90 tablet 4   Cholecalciferol (VITAMIN D3) 125 MCG (5000 UT) TABS Take 5,000 Units by mouth daily.     Collagen-Vitamin C-Biotin (COLLAGEN PO) Take 3 capsules by mouth daily. 5000 units total     furosemide  (LASIX ) 20 MG tablet Take 20 mg by mouth daily.     furosemide  (LASIX ) 40 MG tablet TAKE 1 TABLET DAILY 90 tablet 1   losartan  (COZAAR ) 25 MG tablet Take 1 tablet (25 mg total) by mouth daily. 90 tablet 2   Magnesium  Glycinate 100 MG CAPS Take 100 tablets by mouth daily.     metoprolol  succinate (TOPROL  XL) 25 MG 24 hr tablet Take 1 tablet (25 mg total) by mouth daily. Take along with 100mg  tablet to equal 125mg  daily 90 tablet 2   metoprolol  succinate (TOPROL -XL) 100 MG 24 hr tablet Take 1 tablet (100 mg total) by mouth daily. Take along with 25mg  tablet to equal 125mg  90 tablet 2   Multiple Vitamins-Minerals (ONE-A-DAY MENS 50+) TABS  Take 1 tablet by mouth daily. Silver 50+     omeprazole  (PRILOSEC ) 20 MG capsule Take 20 mg by mouth daily.     rivaroxaban  (XARELTO ) 20 MG TABS tablet Take 1 tablet (20 mg total) by mouth daily with supper. 90 tablet 2   traMADol (ULTRAM) 50 MG tablet Take 50 mg by mouth every 6 (six) hours as needed (Arthritis). (Patient taking differently: Take 50 mg by mouth 2 (two) times daily.)     No current facility-administered medications for this encounter.    Atrial Fibrillation Management history:  Previous antiarrhythmic drugs: none Previous cardioversions: 12/04/23, 06/16/24 Previous ablations:  none Anticoagulation history: Eliquis    ROS- All systems are reviewed and negative except as per the HPI above.  Physical Exam: BP (!) 130/98   Pulse 90   Ht 5' 11 (1.803 m)   Wt (!) 179.4 kg   BMI 55.15 kg/m   GEN- The patient is well appearing, alert and oriented x 3 today.   Neck - no JVD or carotid bruit noted Lungs- Clear to ausculation bilaterally, normal work of breathing Heart- Irregular rate and rhythm, no murmurs, rubs or gallops, PMI not laterally displaced Extremities- no clubbing, cyanosis, or edema Skin - no rash or ecchymosis noted   EKG today demonstrates  Vent. rate 90 BPM PR interval * ms QRS duration 134 ms QT/QTcB 428/523 ms P-R-T axes * 76 20 Atrial fibrillation Right bundle branch block Abnormal ECG When compared with ECG of 16-Jun-2024 10:29, Atrial fibrillation has replaced Sinus rhythm   Echo 10/21/23 demonstrated  1. Left ventricular ejection fraction, by estimation, is 60 to 65%. The  left ventricle has normal function. Left ventricular endocardial border  not optimally defined to evaluate regional wall motion. not well  visualized left ventricular hypertrophy.  Left ventricular diastolic parameters are indeterminate.   2. Right ventricular systolic function was not well visualized. The right  ventricular size is not well visualized. Tricuspid regurgitation signal is  inadequate for assessing PA pressure.   3. The mitral valve was not well visualized. Trivial mitral valve  regurgitation. No evidence of mitral stenosis.   4. The aortic valve was not well visualized. Aortic valve regurgitation  is not visualized. No aortic stenosis is present.   5. Aortic dilatation noted. There is mild dilatation of the ascending  aorta, measuring 41 mm.   6. Technically difficult study, limited visualization    ASSESSMENT & PLAN CHA2DS2-VASc Score = 4  The patient's score is based upon: CHF History: 0 HTN History: 1 Diabetes History: 0 Stroke  History: 2 Vascular Disease History: 0 Age Score: 1 Gender Score: 0      ASSESSMENT AND PLAN: Persistent Atrial Fibrillation (ICD10:  I48.19) The patient's CHA2DS2-VASc score is 4, indicating a 4.8% annual risk of stroke.   S/p successful DCCV on 12/04/23.  S/p successful DCCV on 06/16/24 after 2 shocks @ 360J.   Patient is currently in Afib. Continue Toprol  125 mg daily. We discussed rhythm control options due to ERAF. After discussion, patient agrees to begin Multaq  400 mg BID. He will begin this medication on week prior to repeat cardioversion. We discussed the procedure cardioversion to try to convert to NSR. We discussed the risks vs benefits of this procedure and how ultimately we cannot predict whether a patient will have early return of arrhythmia post procedure. After discussion, the patient wishes to proceed with cardioversion. Labs drawn today.    Informed Consent   Shared Decision Making/Informed Consent The risks (stroke,  cardiac arrhythmias rarely resulting in the need for a temporary or permanent pacemaker, skin irritation or burns and complications associated with conscious sedation including aspiration, arrhythmia, respiratory failure and death), benefits (restoration of normal sinus rhythm) and alternatives of a direct current cardioversion were explained in detail to Ryan [name] and he agrees to proceed.       Secondary Hypercoagulable State (ICD10:  D68.69) The patient is at significant risk for stroke/thromboembolism based upon his CHA2DS2-VASc Score of 4.   Continue Xarelto .      Follow up 2 weeks after DCCV.    Terra Pac, PA-C  Afib Clinic [facility] [address] [place], [place] [address] [phone]

## 2024-07-01 NOTE — Patient Instructions (Addendum)
 Start dronedarone  (MULTAQ  400 mg twice a day the week before procedure 07/08/24  Cardioversion scheduled for: 07/15/24 FRIDAY 8:00AM   - Arrive at the [facility] ([address])  and check in with ADMITTING at 8:00AM   - Do not eat or drink anything after midnight the night prior to your procedure.   - Take all your morning medication (except diabetic medications) with a sip of water prior to arrival.  - Do NOT miss any doses of your blood thinner - if you should miss a dose or take a dose more than 4 hours late -- please notify our office immediately.  - You will not be able to drive home after your procedure. Please ensure you have a responsible adult to drive you home. You will need someone with you for 24 hours post procedure.     - Expect to be in the procedural area approximately 2 hours.   - If you feel as if you go back into normal rhythm prior to scheduled cardioversion, please notify our office immediately.   If your procedure is canceled in the cardioversion suite you will be charged a cancellation fee.

## 2024-07-02 LAB — BASIC METABOLIC PANEL WITH GFR
BUN/Creatinine Ratio: 12 (ref 10–24)
BUN: 13 mg/dL (ref 8–27)
CO2: 25 mmol/L (ref 20–29)
Calcium: 9.6 mg/dL (ref 8.6–10.2)
Chloride: 97 mmol/L (ref 96–106)
Creatinine, Ser: 1.07 mg/dL (ref 0.76–1.27)
Glucose: 103 mg/dL — ABNORMAL HIGH (ref 70–99)
Potassium: 4.3 mmol/L (ref 3.5–5.2)
Sodium: 140 mmol/L (ref 134–144)
eGFR: 77 mL/min/1.73 (ref >59)

## 2024-07-02 LAB — CBC
Hematocrit: 36.5 % — ABNORMAL LOW (ref 37.5–51.0)
Hemoglobin: 11.3 g/dL — ABNORMAL LOW (ref 13.0–17.7)
MCH: 26.3 pg — ABNORMAL LOW (ref 26.6–33.0)
MCHC: 31 g/dL — ABNORMAL LOW (ref 31.5–35.7)
MCV: 85 fL (ref 79–97)
Platelets: 286 x10E3/uL (ref 150–450)
RBC: 4.3 x10E6/uL (ref 4.14–5.80)
RDW: 15 % (ref 11.6–15.4)
WBC: 11.5 x10E3/uL — ABNORMAL HIGH (ref 3.4–10.8)

## 2024-07-04 ENCOUNTER — Ambulatory Visit (HOSPITAL_COMMUNITY): Payer: Self-pay | Admitting: Internal Medicine

## 2024-07-14 NOTE — Progress Notes (Signed)
 Spoke to patient and instructed them to come at 0800  and to be NPO after 0000.     Confirmed that patient will have a ride home and someone to stay with them for 24 hours after the procedure.   Medications reviewed.  Confirmed blood thinner.  Confirmed no breaks in taking blood thinner for 3+ weeks prior to procedure.

## 2024-07-15 ENCOUNTER — Other Ambulatory Visit: Payer: Self-pay

## 2024-07-15 ENCOUNTER — Ambulatory Visit (HOSPITAL_COMMUNITY)

## 2024-07-15 ENCOUNTER — Ambulatory Visit (HOSPITAL_COMMUNITY)
Admission: RE | Admit: 2024-07-15 | Discharge: 2024-07-15 | Disposition: A | Discharge status: Home / Self Care | Attending: Cardiology | Admitting: Cardiology

## 2024-07-15 ENCOUNTER — Encounter (HOSPITAL_COMMUNITY)
Admission: RE | Disposition: A | Discharge status: Home / Self Care | Payer: Self-pay | Source: Home / Self Care | Attending: Cardiology

## 2024-07-15 DIAGNOSIS — Z7901 Long term (current) use of anticoagulants: Secondary | ICD-10-CM | POA: Diagnosis not present

## 2024-07-15 DIAGNOSIS — I4819 Other persistent atrial fibrillation: Secondary | ICD-10-CM | POA: Insufficient documentation

## 2024-07-15 DIAGNOSIS — F419 Anxiety disorder, unspecified: Secondary | ICD-10-CM

## 2024-07-15 DIAGNOSIS — Z87891 Personal history of nicotine dependence: Secondary | ICD-10-CM

## 2024-07-15 DIAGNOSIS — G4733 Obstructive sleep apnea (adult) (pediatric): Secondary | ICD-10-CM | POA: Diagnosis not present

## 2024-07-15 DIAGNOSIS — I4891 Unspecified atrial fibrillation: Secondary | ICD-10-CM

## 2024-07-15 DIAGNOSIS — I10 Essential (primary) hypertension: Secondary | ICD-10-CM

## 2024-07-15 DIAGNOSIS — Z6841 Body Mass Index (BMI) 40.0 and over, adult: Secondary | ICD-10-CM | POA: Diagnosis not present

## 2024-07-15 DIAGNOSIS — Z006 Encounter for examination for normal comparison and control in clinical research program: Secondary | ICD-10-CM

## 2024-07-15 DIAGNOSIS — E669 Obesity, unspecified: Secondary | ICD-10-CM | POA: Insufficient documentation

## 2024-07-15 DIAGNOSIS — Z79899 Other long term (current) drug therapy: Secondary | ICD-10-CM | POA: Insufficient documentation

## 2024-07-15 DIAGNOSIS — D6869 Other thrombophilia: Secondary | ICD-10-CM | POA: Diagnosis not present

## 2024-07-15 HISTORY — PX: CARDIOVERSION: EP1203

## 2024-07-15 SURGERY — CARDIOVERSION (CATH LAB)
Anesthesia: General

## 2024-07-15 MED ORDER — SODIUM CHLORIDE 0.9 % IV SOLN: INTRAVENOUS | Status: DC

## 2024-07-15 MED ORDER — SODIUM CHLORIDE 0.9% FLUSH
INTRAVENOUS | Status: DC | PRN
  Administered 2024-07-15: 5 mL via INTRAVENOUS

## 2024-07-15 MED ORDER — PROPOFOL 10 MG/ML IV BOLUS
INTRAVENOUS | Status: DC | PRN
  Administered 2024-07-15: 80 mg via INTRAVENOUS

## 2024-07-15 MED ORDER — LIDOCAINE 2% (20 MG/ML) 5 ML SYRINGE
INTRAMUSCULAR | Status: DC | PRN
  Administered 2024-07-15: 100 mg via INTRAVENOUS

## 2024-07-15 SURGICAL SUPPLY — 1 items: PAD DEFIB RADIO PHYSIO CONN (PAD) ×2 IMPLANT

## 2024-07-15 NOTE — Anesthesia Preprocedure Evaluation (Signed)
 Anesthesia Evaluation  Patient identified by MRN, date of birth, ID band Patient awake    Reviewed: Allergy & Precautions, H&P , NPO status , Patient's Chart, lab work & pertinent test results  History of Anesthesia Complications Negative for: history of anesthetic complications  Airway Mallampati: II  TM Distance: >3 FB Neck ROM: Full    Dental no notable dental hx.    Pulmonary shortness of breath and with exertion, sleep apnea , former smoker   Pulmonary exam normal breath sounds clear to auscultation       Cardiovascular hypertension, Normal cardiovascular exam+ dysrhythmias Atrial Fibrillation  Rhythm:Regular Rate:Normal     Neuro/Psych   Anxiety     TIA negative psych ROS   GI/Hepatic negative GI ROS, Neg liver ROS,,,  Endo/Other    Class 4 obesity  Renal/GU negative Renal ROS  negative genitourinary   Musculoskeletal  (+) Arthritis ,    Abdominal  (+) + obese  Peds negative pediatric ROS (+)  Hematology negative hematology ROS (+)   Anesthesia Other Findings   Reproductive/Obstetrics negative OB ROS                              Anesthesia Physical Anesthesia Plan  ASA: 4  Anesthesia Plan: General   Post-op Pain Management:    Induction: Intravenous  PONV Risk Score and Plan: 2 and Treatment may vary due to age or medical condition  Airway Management Planned: Natural Airway and Simple Face Mask  Additional Equipment: None  Intra-op Plan:   Post-operative Plan:   Informed Consent: I have reviewed the patients History and Physical, chart, labs and discussed the procedure including the risks, benefits and alternatives for the proposed anesthesia with the patient or authorized representative who has indicated his/her understanding and acceptance.     Dental advisory given  Plan Discussed with: CRNA  Anesthesia Plan Comments:         Anesthesia Quick  Evaluation

## 2024-07-15 NOTE — Transfer of Care (Signed)
 Immediate Anesthesia Transfer of Care Note  Patient: Ryan [name]  Procedure(s) Performed: CARDIOVERSION  Patient Location: Cath Lab  Anesthesia Type:General  Level of Consciousness: awake, alert , oriented, and patient cooperative  Airway & Oxygen Therapy: Patient Spontanous Breathing and Patient connected to nasal cannula oxygen  Post-op Assessment: Report given to RN and Post -op Vital signs reviewed and stable  Post vital signs: Reviewed and stable  Last Vitals:  Vitals Value Taken Time  BP 126/84 MAP 97 08:35  Temp    Pulse 76   Resp 16   SpO2 96%     Last Pain:  Vitals:   07/15/24 0754  TempSrc: Temporal         Complications: No notable events documented.

## 2024-07-15 NOTE — Research (Signed)
 [facility] Cardioversion Informed Consent   Subject Name: Ryan [name]  Subject met inclusion and exclusion criteria.  The informed consent form, study requirements and expectations were reviewed with the subject and questions and concerns were addressed prior to the signing of the consent form.  The subject verbalized understanding of the trial requirements.  The subject agreed to participate in the [facility] Cardioversion trial and signed the informed consent at 0800 on 26/Sep/2025.  The informed consent was obtained prior to performance of any protocol-specific procedures for the subject.  A copy of the signed informed consent was given to the subject and a copy was placed in the subject's medical record.   [name]

## 2024-07-15 NOTE — CV Procedure (Signed)
 Procedure:   DCCV  Indication:  Symptomatic atrial fibrillation  Procedure Note:  The patient signed informed consent.  They have had had therapeutic anticoagulation with Xarelto  greater than 3 weeks.  Anesthesia was administered by Dr. Erma and Ike Hancock, CRNA.  Adequate airway was maintained throughout and vital followed per protocol.  They were cardioverted x 1 with 200J of biphasic synchronized energy.  They converted to NSR with rate 70s.  There were no apparent complications.  The patient had normal neuro status and respiratory status post procedure with vitals stable as recorded elsewhere.    Follow up:  They will continue on current medical therapy and follow up with cardiology as scheduled.  Lonni Nanas, MD 07/15/2024 8:42 AM

## 2024-07-15 NOTE — Interval H&P Note (Signed)
 History and Physical Interval Note:  07/15/2024 8:20 AM  Ryan [name]  has presented today for surgery, with the diagnosis of AFIB.  The various methods of treatment have been discussed with the patient and family. After consideration of risks, benefits and other options for treatment, the patient has consented to  Procedure(s): CARDIOVERSION (N/A) as a surgical intervention.  The patient's history has been reviewed, patient examined, no change in status, stable for surgery.  I have reviewed the patient's chart and labs.  Questions were answered to the patient's satisfaction.     Lonni LITTIE Nanas

## 2024-07-15 NOTE — Anesthesia Postprocedure Evaluation (Signed)
 Anesthesia Post Note  Patient: Ryan [name]  Procedure(s) Performed: CARDIOVERSION     Patient location during evaluation: Cath Lab Anesthesia Type: General Level of consciousness: awake and alert Pain management: pain level controlled Vital Signs Assessment: post-procedure vital signs reviewed and stable Respiratory status: spontaneous breathing, nonlabored ventilation, respiratory function stable and patient connected to nasal cannula oxygen Cardiovascular status: blood pressure returned to baseline and stable Postop Assessment: no apparent nausea or vomiting Anesthetic complications: no   No notable events documented.  Last Vitals:  Vitals:   07/15/24 0845 07/15/24 0900  BP: (!) 111/93 (!) 141/87  Pulse:  78  Resp:  16  Temp:    SpO2:  97%    Last Pain:  Vitals:   07/15/24 0836  TempSrc:   PainSc: 0-No pain                 Thom JONELLE Peoples

## 2024-07-17 ENCOUNTER — Encounter (HOSPITAL_COMMUNITY): Payer: Self-pay | Admitting: Cardiology

## 2024-07-29 ENCOUNTER — Encounter (HOSPITAL_COMMUNITY): Payer: Self-pay | Admitting: Internal Medicine

## 2024-07-29 ENCOUNTER — Ambulatory Visit (HOSPITAL_COMMUNITY)
Admission: RE | Admit: 2024-07-29 | Discharge: 2024-07-29 | Disposition: A | Discharge status: Home / Self Care | Source: Ambulatory Visit | Attending: Internal Medicine | Admitting: Internal Medicine

## 2024-07-29 VITALS — BP 132/92 | HR 93 | Ht 71.0 in | Wt >= 6400 oz

## 2024-07-29 DIAGNOSIS — D6869 Other thrombophilia: Secondary | ICD-10-CM | POA: Diagnosis present

## 2024-07-29 DIAGNOSIS — Z5181 Encounter for therapeutic drug level monitoring: Secondary | ICD-10-CM | POA: Diagnosis present

## 2024-07-29 DIAGNOSIS — Z79899 Other long term (current) drug therapy: Secondary | ICD-10-CM | POA: Insufficient documentation

## 2024-07-29 DIAGNOSIS — I4819 Other persistent atrial fibrillation: Secondary | ICD-10-CM | POA: Diagnosis not present

## 2024-07-29 MED ORDER — METOPROLOL SUCCINATE ER 50 MG PO TB24: 150.0000 mg | ORAL_TABLET | Freq: Every day | ORAL | 3 refills | Status: AC

## 2024-07-29 NOTE — Progress Notes (Signed)
 Primary Care Physician: Pia Kerney SQUIBB, MD Primary Cardiologist: None Electrophysiologist: None     Referring Physician: Dr. Lafe Glendia [name] [name] is a 68 y.o. male with a history of HTN, TIA in 2019, fatty liver disease, obesity, dilated aortic root, OSA not on CPAP, and paroxysmal atrial fibrillation who presents for consultation in the [facility]. Hospital admission 10/20/23-10/21/23 for chest pain found have flu type A and new onset Afib. Patient is on Eliquis  5 mg BID for a CHADS2VASC score of 4.  On evaluation today, he is currently in Afib. Patient drinks decaf coffee, does not drink any alcohol, and states he does not snore. He admits to me since hospital discharge he has been cutting his Eliquis  in half and taking half tablet twice daily since day of discharge.   On follow up 12/11/23, he is currently in NSR. S/p successful DCCV on 12/04/23. No missed doses of Eliquis  5 mg BID. He notes to always have an elevated pulse. Patient and wife note when he would previously sleep on his back she would have to wake him because it sounded like he stopped breathing. Patient tells me he sleeps on his side and no longer does this. He feels like the Eliquis  is making him sleepy.   On follow up 06/10/24, patient is currently in Afib. Patient notes via Kardiamobile device that he was paroxysmal in terms of Afib burden since our last OV. He notes over the past two months to be persistently in it. No bleeding issues on Xarelto .   On follow up 07/01/24, patient is currently in Afib. S/p successful DCCV on 8/28 x 2 shocks at 360J. He does acknowledge he felt much better for the 5 days he was in normal rhythm. No missed doses of Xarelto .   On follow up 07/29/24, patient is currently in NSR. S/p successful DCCV on 9/26 on Multaq . No missed doses of Xarelto . He and wife are going to the gym multiple times a week. He has Kardiamobile device and notes HR at home is in the upper  80-90s.   Today, he denies symptoms of palpitations, chest pain, shortness of breath, orthopnea, PND, lower extremity edema, dizziness, presyncope, syncope, snoring, daytime somnolence, bleeding, or neurologic sequela. The patient is tolerating medications without difficulties and is otherwise without complaint today.    Atrial Fibrillation Risk Factors:  he does have symptoms or diagnosis of sleep apnea. he is compliant with CPAP therapy.  he has a BMI of Body mass index is 56.04 kg/m.SABRA Filed Weights   07/29/24 1026  Weight: (!) 182.3 kg     Current Outpatient Medications  Medication Sig Dispense Refill   atorvastatin  (LIPITOR) 40 MG tablet Take 1 tablet (40 mg total) by mouth daily. 90 tablet 4   Cholecalciferol (VITAMIN D3) 125 MCG (5000 UT) TABS Take 5,000 Units by mouth daily.     Collagen-Vitamin C-Biotin (COLLAGEN PO) Take 3 capsules by mouth daily. 5000 units total     dronedarone  (MULTAQ ) 400 MG tablet Take 1 tablet (400 mg total) by mouth 2 (two) times daily with a meal. 60 tablet 6   furosemide  (LASIX ) 20 MG tablet Take 20 mg by mouth daily as needed for fluid or edema.     furosemide  (LASIX ) 40 MG tablet TAKE 1 TABLET DAILY 90 tablet 1   losartan  (COZAAR ) 25 MG tablet Take 1 tablet (25 mg total) by mouth daily. 90 tablet 2   MAGNESIUM  GLYCINATE PO Take  5,000 mg by mouth daily.     Multiple Vitamins-Minerals (ONE-A-DAY MENS 50+) TABS Take 1 tablet by mouth daily. Silver 50+     omeprazole  (PRILOSEC ) 20 MG capsule Take 20 mg by mouth daily.     rivaroxaban  (XARELTO ) 20 MG TABS tablet Take 1 tablet (20 mg total) by mouth daily with supper. 90 tablet 2   traMADol (ULTRAM) 50 MG tablet Take 50 mg by mouth every 6 (six) hours as needed (Arthritis). (Patient taking differently: Take 50 mg by mouth 2 (two) times daily.)     No current facility-administered medications for this encounter.    Atrial Fibrillation Management history:  Previous antiarrhythmic drugs:  Multaq  Previous cardioversions: 12/04/23, 06/16/24, 07/15/24 Previous ablations: none Anticoagulation history: Xarelto    ROS- All systems are reviewed and negative except as per the HPI above.  Physical Exam: BP (!) 132/92   Pulse 93   Ht 5' 11 (1.803 m)   Wt (!) 182.3 kg   BMI 56.04 kg/m   GEN- The patient is well appearing, alert and oriented x 3 today.   Neck - no JVD or carotid bruit noted Lungs- Clear to ausculation bilaterally, normal work of breathing Heart- Regular rate and rhythm, no murmurs, rubs or gallops, PMI not laterally displaced Extremities- no clubbing, cyanosis, or edema Skin - no rash or ecchymosis noted   EKG today demonstrates  Vent. rate 93 BPM PR interval 234 ms QRS duration 130 ms QT/QTcB 394/489 ms P-R-T axes 38 59 4 Sinus rhythm with 1st degree A-V block Right bundle branch block Abnormal ECG When compared with ECG of 15-Jul-2024 08:43, No significant change was found   Echo 10/21/23 demonstrated  1. Left ventricular ejection fraction, by estimation, is 60 to 65%. The  left ventricle has normal function. Left ventricular endocardial border  not optimally defined to evaluate regional wall motion. not well  visualized left ventricular hypertrophy.  Left ventricular diastolic parameters are indeterminate.   2. Right ventricular systolic function was not well visualized. The right  ventricular size is not well visualized. Tricuspid regurgitation signal is  inadequate for assessing PA pressure.   3. The mitral valve was not well visualized. Trivial mitral valve  regurgitation. No evidence of mitral stenosis.   4. The aortic valve was not well visualized. Aortic valve regurgitation  is not visualized. No aortic stenosis is present.   5. Aortic dilatation noted. There is mild dilatation of the ascending  aorta, measuring 41 mm.   6. Technically difficult study, limited visualization    ASSESSMENT & PLAN CHA2DS2-VASc Score = 4  The patient's  score is based upon: CHF History: 0 HTN History: 1 Diabetes History: 0 Stroke History: 2 Vascular Disease History: 0 Age Score: 1 Gender Score: 0      ASSESSMENT AND PLAN: Persistent Atrial Fibrillation (ICD10:  I48.19) The patient's CHA2DS2-VASc score is 4, indicating a 4.8% annual risk of stroke.   S/p successful DCCV on 12/04/23.  S/p successful DCCV on 06/16/24 after 2 shocks @ 360J.  S/p successful DCCV on 07/15/24 on Multaq .  Patient is currently in NSR. He notes to feel better in normal rhythm. Increase Toprol  to total of 150 mg daily.  High risk medication monitoring (ICD10: [id number]) Patient requires ongoing monitoring for anti-arrhythmic medication which has the potential to cause life threatening arrhythmias or AV block. ECG intervals are stable. Continue Multaq  400 mg BID.  Secondary Hypercoagulable State (ICD10:  D68.69) The patient is at significant risk for stroke/thromboembolism based upon his CHA2DS2-VASc  Score of 4.   Continue Xarelto .    Follow up 6 months Afib clinic for Multaq  surveillance.    Ryan Pac, PA-C  Af[facility] [address] [place], [place] [address] [phone]

## 2024-07-29 NOTE — Patient Instructions (Signed)
Increase metoprolol to 150 mg daily.

## 2024-08-02 NOTE — Progress Notes (Unsigned)
 New Patient Pulmonology Office Visit   Subjective:  Patient ID: Ryan [name], male    DOB: 11/28/1955  MRN: [id number]  Referred by: Pia Kerney SQUIBB, MD  CC: No chief complaint on file.   HPI Ryan [name] is a 68 y.o. male with ***  68 y/o WM here for a follow up ex-smoker, multifactorial dyspnea including mild restriction, OSA & morbid obesity-- see prob list below>>   {PULM QUESTIONNAIRES (Optional):33196}  ROS  Allergies: Patient has no known allergies.  Current Outpatient Medications:    atorvastatin  (LIPITOR) 40 MG tablet, Take 1 tablet (40 mg total) by mouth daily., Disp: 90 tablet, Rfl: 4   Cholecalciferol (VITAMIN D3) 125 MCG (5000 UT) TABS, Take 5,000 Units by mouth daily., Disp: , Rfl:    Collagen-Vitamin C-Biotin (COLLAGEN PO), Take 3 capsules by mouth daily. 5000 units total, Disp: , Rfl:    dronedarone  (MULTAQ ) 400 MG tablet, Take 1 tablet (400 mg total) by mouth 2 (two) times daily with a meal., Disp: 60 tablet, Rfl: 6   furosemide  (LASIX ) 20 MG tablet, Take 20 mg by mouth daily as needed for fluid or edema., Disp: , Rfl:    furosemide  (LASIX ) 40 MG tablet, TAKE 1 TABLET DAILY, Disp: 90 tablet, Rfl: 1   losartan  (COZAAR ) 25 MG tablet, Take 1 tablet (25 mg total) by mouth daily., Disp: 90 tablet, Rfl: 2   MAGNESIUM  GLYCINATE PO, Take 5,000 mg by mouth daily., Disp: , Rfl:    metoprolol  succinate (TOPROL -XL) 50 MG 24 hr tablet, Take 3 tablets (150 mg total) by mouth daily. Take with or immediately following a meal., Disp: 270 tablet, Rfl: 3   Multiple Vitamins-Minerals (ONE-A-DAY MENS 50+) TABS, Take 1 tablet by mouth daily. Silver 50+, Disp: , Rfl:    omeprazole  (PRILOSEC ) 20 MG capsule, Take 20 mg by mouth daily., Disp: , Rfl:    rivaroxaban  (XARELTO ) 20 MG TABS tablet, Take 1 tablet (20 mg total) by mouth daily with supper., Disp: 90 tablet, Rfl: 2   traMADol (ULTRAM) 50 MG tablet, Take 50 mg by mouth every 6 (six) hours as needed (Arthritis). (Patient taking  differently: Take 50 mg by mouth 2 (two) times daily.), Disp: , Rfl:  Past Medical History:  Diagnosis Date   Anxiety    Arthritis    knees (03/05/2018)   Cigarette smoker    Diastolic dysfunction    Diverticulosis of colon    DJD (degenerative joint disease)    Fatty liver disease, nonalcoholic    Hemorrhoids    Hx of colonic polyps    Hypercholesteremia    Hypertension    Obesity    Seborrheic dermatitis    Slurred speech    Stenosis of rectum and anus    TIA (transient ischemic attack) 03/05/2018   Venous insufficiency    Past Surgical History:  Procedure Laterality Date   CARDIOVERSION N/A 12/04/2023   Procedure: CARDIOVERSION;  Surgeon: Barbaraann Darryle Ned, MD;  Location: [facility] INVASIVE CV LAB;  Service: Cardiovascular;  Laterality: N/A;   CARDIOVERSION N/A 06/16/2024   Procedure: CARDIOVERSION;  Surgeon: Raford Riggs, MD;  Location: [facility] INVASIVE CV LAB;  Service: Cardiovascular;  Laterality: N/A;   CARDIOVERSION N/A 07/15/2024   Procedure: CARDIOVERSION;  Surgeon: Kate Lonni CROME, MD;  Location: [facility] INVASIVE CV LAB;  Service: Cardiovascular;  Laterality: N/A;   COLONOSCOPY W/ POLYPECTOMY  06/27/2009   FLEXIBLE SIGMOIDOSCOPY     KNEE ARTHROSCOPY Right 11/2003   Dr. Heide   KNEE ARTHROSCOPY  Left 05/24/2012   torn meniscus    TONSILLECTOMY     Family History  Problem Relation Age of Onset   Hypertension Father    Hypertension Mother    Social History   Socioeconomic History   Marital status: Widowed    Spouse name: Not on file   Number of children: 2   Years of education: Not on file   Highest education level: Not on file  Occupational History   Not on file  Tobacco Use   Smoking status: Former    Current packs/day: 0.00    Average packs/day: 1 pack/day for 27.0 years (27.0 ttl pk-yrs)    Types: Cigarettes    Start date: 10/20/1982    Quit date: 10/20/2009    Years since quitting: 14.7   Smokeless tobacco: Never   Tobacco comments:    Former  smoker 07/01/24  Vaping Use   Vaping status: Never Used  Substance and Sexual Activity   Alcohol use: Yes    Alcohol/week: 8.0 standard drinks of alcohol    Types: 8 Cans of beer per week   Drug use: Never   Sexual activity: Not Currently  Other Topics Concern   Not on file  Social History Narrative   Not on file   Social Drivers of Health   Financial Resource Strain: Not on file  Food Insecurity: No Food Insecurity (11/03/2023)   Hunger Vital Sign    Worried About Running Out of Food in the Last Year: Never true    Ran Out of Food in the Last Year: Never true  Transportation Needs: No Transportation Needs (11/03/2023)   PRAPARE - Administrator, Civil Service (Medical): No    Lack of Transportation (Non-Medical): No  Physical Activity: Not on file  Stress: Not on file  Social Connections: Socially Isolated (10/20/2023)   Social Connection and Isolation Panel    Frequency of Communication with Friends and Family: More than three times a week    Frequency of Social Gatherings with Friends and Family: Once a week    Attends Religious Services: Never    Database administrator or Organizations: No    Attends Banker Meetings: Never    Marital Status: Widowed  Intimate Partner Violence: Not At Risk (11/03/2023)   Humiliation, Afraid, Rape, and Kick questionnaire    Fear of Current or Ex-Partner: No    Emotionally Abused: No    Physically Abused: No    Sexually Abused: No       Objective:  There were no vitals taken for this visit. {Pulm Vitals (Optional):32837}  Physical Exam  Diagnostic Review:  {Labs (Optional):32838}     Assessment & Plan:   Assessment & Plan    No follow-ups on file.    Marny Patch, MD Pulmonary and Critical Care Medicine [facility]

## 2024-08-03 ENCOUNTER — Ambulatory Visit

## 2024-08-03 ENCOUNTER — Ambulatory Visit (INDEPENDENT_AMBULATORY_CARE_PROVIDER_SITE_OTHER)

## 2024-08-03 VITALS — BP 145/85 | HR 110 | Ht 71.0 in | Wt >= 6400 oz

## 2024-08-03 DIAGNOSIS — R0609 Other forms of dyspnea: Secondary | ICD-10-CM

## 2024-08-03 DIAGNOSIS — R609 Edema, unspecified: Secondary | ICD-10-CM

## 2024-08-03 NOTE — Assessment & Plan Note (Addendum)
 68 years old with past medical history of atrial fibrillation on anticoagulation, who came for evaluation of dyspnea on exertion.  Patient reported to have dyspnea with walking which has been worsening over the last 6 months, however he is able to swim and do 30 laps for 4 to 5 days a week. His chest x-ray today was unremarkable.  He denied any pulmonary conditions in the past.  Her lung exam is normal.  I think her dyspnea is related to fluid retention and increased weight.  So far he has increased 26 pounds in the last year. He is taking Lasix , dose increased to 60 mg daily. I recommend to check daily weights, decrease fluid intake 1 to 2 L a day, and avoid salty food. I am not highly concerned about pulmonary conditions at this moment.  I will check for a pulmonary function test.  Orders:   DG Chest 2 View; Future   Pulmonary function test; Future

## 2024-08-03 NOTE — Patient Instructions (Signed)
 Dear Ryan [name] Based on your symptoms, I don't think your symptoms are related to lung problems, I recommended the following: -Weight loss  -Decrease water intake 1-2 liters a day 6-8 glasses at the most. -Decrease salt intake. -I will order a pulmonary function test and chest xray. -Continue exercising, you are doing great!  I will see you in 3 month.

## 2024-08-08 ENCOUNTER — Ambulatory Visit: Payer: Self-pay

## 2024-08-12 ENCOUNTER — Other Ambulatory Visit (HOSPITAL_COMMUNITY): Payer: Self-pay

## 2024-08-12 MED ORDER — DRONEDARONE HCL 400 MG PO TABS: 400.0000 mg | ORAL_TABLET | Freq: Two times a day (BID) | ORAL | Status: AC

## 2024-08-12 MED ORDER — DRONEDARONE HCL 400 MG PO TABS: 400.0000 mg | ORAL_TABLET | Freq: Two times a day (BID) | ORAL | Status: DC

## 2024-08-12 NOTE — Addendum Note (Signed)
 Addended by: EZZARD PALMA F on: 08/12/2024 12:20 PM   Modules accepted: Orders

## 2024-08-19 ENCOUNTER — Other Ambulatory Visit (HOSPITAL_COMMUNITY): Payer: Self-pay | Admitting: Internal Medicine

## 2024-09-06 ENCOUNTER — Encounter (HOSPITAL_COMMUNITY): Payer: Self-pay

## 2024-09-06 ENCOUNTER — Inpatient Hospital Stay (HOSPITAL_COMMUNITY)
Admission: EM | Admit: 2024-09-06 | Discharge: 2024-09-09 | DRG: 378 | Disposition: A | Discharge status: Home / Self Care

## 2024-09-06 ENCOUNTER — Emergency Department (HOSPITAL_COMMUNITY)

## 2024-09-06 DIAGNOSIS — K317 Polyp of stomach and duodenum: Secondary | ICD-10-CM | POA: Diagnosis present

## 2024-09-06 DIAGNOSIS — K921 Melena: Secondary | ICD-10-CM | POA: Diagnosis not present

## 2024-09-06 DIAGNOSIS — Z8673 Personal history of transient ischemic attack (TIA), and cerebral infarction without residual deficits: Secondary | ICD-10-CM

## 2024-09-06 DIAGNOSIS — D649 Anemia, unspecified: Secondary | ICD-10-CM | POA: Diagnosis not present

## 2024-09-06 DIAGNOSIS — I110 Hypertensive heart disease with heart failure: Secondary | ICD-10-CM | POA: Diagnosis present

## 2024-09-06 DIAGNOSIS — Z7901 Long term (current) use of anticoagulants: Secondary | ICD-10-CM

## 2024-09-06 DIAGNOSIS — G4733 Obstructive sleep apnea (adult) (pediatric): Secondary | ICD-10-CM | POA: Diagnosis present

## 2024-09-06 DIAGNOSIS — I4891 Unspecified atrial fibrillation: Secondary | ICD-10-CM | POA: Diagnosis present

## 2024-09-06 DIAGNOSIS — D123 Benign neoplasm of transverse colon: Secondary | ICD-10-CM

## 2024-09-06 DIAGNOSIS — K635 Polyp of colon: Secondary | ICD-10-CM

## 2024-09-06 DIAGNOSIS — I5032 Chronic diastolic (congestive) heart failure: Secondary | ICD-10-CM | POA: Diagnosis present

## 2024-09-06 DIAGNOSIS — I5030 Unspecified diastolic (congestive) heart failure: Secondary | ICD-10-CM | POA: Insufficient documentation

## 2024-09-06 DIAGNOSIS — I480 Paroxysmal atrial fibrillation: Secondary | ICD-10-CM | POA: Diagnosis present

## 2024-09-06 DIAGNOSIS — D125 Benign neoplasm of sigmoid colon: Secondary | ICD-10-CM

## 2024-09-06 DIAGNOSIS — K5730 Diverticulosis of large intestine without perforation or abscess without bleeding: Secondary | ICD-10-CM

## 2024-09-06 DIAGNOSIS — K219 Gastro-esophageal reflux disease without esophagitis: Secondary | ICD-10-CM | POA: Diagnosis present

## 2024-09-06 DIAGNOSIS — E7800 Pure hypercholesterolemia, unspecified: Secondary | ICD-10-CM | POA: Diagnosis present

## 2024-09-06 DIAGNOSIS — Z8249 Family history of ischemic heart disease and other diseases of the circulatory system: Secondary | ICD-10-CM

## 2024-09-06 DIAGNOSIS — Z87891 Personal history of nicotine dependence: Secondary | ICD-10-CM

## 2024-09-06 DIAGNOSIS — F419 Anxiety disorder, unspecified: Secondary | ICD-10-CM | POA: Diagnosis present

## 2024-09-06 DIAGNOSIS — D509 Iron deficiency anemia, unspecified: Secondary | ICD-10-CM | POA: Diagnosis present

## 2024-09-06 DIAGNOSIS — Z79899 Other long term (current) drug therapy: Secondary | ICD-10-CM

## 2024-09-06 DIAGNOSIS — K641 Second degree hemorrhoids: Secondary | ICD-10-CM | POA: Diagnosis present

## 2024-09-06 DIAGNOSIS — K2270 Barrett's esophagus without dysplasia: Secondary | ICD-10-CM | POA: Diagnosis present

## 2024-09-06 DIAGNOSIS — R0602 Shortness of breath: Secondary | ICD-10-CM | POA: Diagnosis present

## 2024-09-06 DIAGNOSIS — M1990 Unspecified osteoarthritis, unspecified site: Secondary | ICD-10-CM | POA: Insufficient documentation

## 2024-09-06 DIAGNOSIS — D131 Benign neoplasm of stomach: Secondary | ICD-10-CM

## 2024-09-06 LAB — CBC
HCT: 27.1 % — ABNORMAL LOW (ref 39.0–52.0)
Hemoglobin: 8 g/dL — ABNORMAL LOW (ref 13.0–17.0)
MCH: 22 pg — ABNORMAL LOW (ref 26.0–34.0)
MCHC: 29.5 g/dL — ABNORMAL LOW (ref 30.0–36.0)
MCV: 74.5 fL — ABNORMAL LOW (ref 80.0–100.0)
Platelets: 323 K/uL (ref 150–400)
RBC: 3.64 MIL/uL — ABNORMAL LOW (ref 4.22–5.81)
RDW: 18.7 % — ABNORMAL HIGH (ref 11.5–15.5)
WBC: 15.5 K/uL — ABNORMAL HIGH (ref 4.0–10.5)
nRBC: 0.5 % — ABNORMAL HIGH (ref 0.0–0.2)

## 2024-09-06 LAB — PREPARE RBC (CROSSMATCH)

## 2024-09-06 LAB — FERRITIN: Ferritin: 4 ng/mL — ABNORMAL LOW (ref 24–336)

## 2024-09-06 LAB — I-STAT CHEM 8, ED
BUN: 14 mg/dL (ref 8–23)
Calcium, Ion: 1.13 mmol/L — ABNORMAL LOW (ref 1.15–1.40)
Chloride: 98 mmol/L (ref 98–111)
Creatinine, Ser: 1.1 mg/dL (ref 0.61–1.24)
Glucose, Bld: 103 mg/dL — ABNORMAL HIGH (ref 70–99)
HCT: 23 % — ABNORMAL LOW (ref 39.0–52.0)
Hemoglobin: 7.8 g/dL — ABNORMAL LOW (ref 13.0–17.0)
Potassium: 3.9 mmol/L (ref 3.5–5.1)
Sodium: 136 mmol/L (ref 135–145)
TCO2: 26 mmol/L (ref 22–32)

## 2024-09-06 LAB — COMPREHENSIVE METABOLIC PANEL WITH GFR
ALT: 25 U/L (ref 0–44)
AST: 27 U/L (ref 15–41)
Albumin: 3.5 g/dL (ref 3.5–5.0)
Alkaline Phosphatase: 93 U/L (ref 38–126)
Anion gap: 17 — ABNORMAL HIGH (ref 5–15)
BUN: 13 mg/dL (ref 8–23)
CO2: 21 mmol/L — ABNORMAL LOW (ref 22–32)
Calcium: 9 mg/dL (ref 8.9–10.3)
Chloride: 98 mmol/L (ref 98–111)
Creatinine, Ser: 1 mg/dL (ref 0.61–1.24)
GFR, Estimated: >60 mL/min (ref >60)
Glucose, Bld: 101 mg/dL — ABNORMAL HIGH (ref 70–99)
Potassium: 3.9 mmol/L (ref 3.5–5.1)
Sodium: 136 mmol/L (ref 135–145)
Total Bilirubin: 0.7 mg/dL (ref 0.0–1.2)
Total Protein: 6.7 g/dL (ref 6.5–8.1)

## 2024-09-06 LAB — ABO/RH: ABO/RH(D): O POS

## 2024-09-06 LAB — FOLATE: Folate: >20 ng/mL (ref >5.9)

## 2024-09-06 LAB — RETICULOCYTES
Immature Retic Fract: 33.1 % — ABNORMAL HIGH (ref 2.3–15.9)
RBC.: 3.28 MIL/uL — ABNORMAL LOW (ref 4.22–5.81)
Retic Count, Absolute: 65.9 K/uL (ref 19.0–186.0)
Retic Ct Pct: 2 % (ref 0.4–3.1)

## 2024-09-06 LAB — IRON AND TIBC
Iron: 13 ug/dL — ABNORMAL LOW (ref 45–182)
Saturation Ratios: 3 % — ABNORMAL LOW (ref 17.9–39.5)
TIBC: 522 ug/dL — ABNORMAL HIGH (ref 250–450)
UIBC: 509 ug/dL

## 2024-09-06 LAB — PROTIME-INR
INR: 1.4 — ABNORMAL HIGH (ref 0.8–1.2)
Prothrombin Time: 18.2 s — ABNORMAL HIGH (ref 11.4–15.2)

## 2024-09-06 LAB — POC OCCULT BLOOD, ED: Fecal Occult Bld: NEGATIVE

## 2024-09-06 LAB — BRAIN NATRIURETIC PEPTIDE: B Natriuretic Peptide: 192.4 pg/mL — ABNORMAL HIGH (ref 0.0–100.0)

## 2024-09-06 LAB — VITAMIN B12: Vitamin B-12: 629 pg/mL (ref 180–914)

## 2024-09-06 MED ORDER — PANTOPRAZOLE SODIUM 40 MG PO TBEC
40.0000 mg | DELAYED_RELEASE_TABLET | Freq: Every day | ORAL | Status: DC
  Administered 2024-09-07 – 2024-09-09 (×3): 40 mg via ORAL
  Filled 2024-09-06 (×3): qty 1

## 2024-09-06 MED ORDER — FUROSEMIDE 20 MG PO TABS
40.0000 mg | ORAL_TABLET | Freq: Two times a day (BID) | ORAL | Status: DC
  Filled 2024-09-06: qty 2

## 2024-09-06 MED ORDER — SODIUM CHLORIDE 0.9% IV SOLUTION: Freq: Once | INTRAVENOUS | Status: AC

## 2024-09-06 MED ORDER — LOSARTAN POTASSIUM 25 MG PO TABS
25.0000 mg | ORAL_TABLET | Freq: Every day | ORAL | Status: DC
  Administered 2024-09-07 – 2024-09-09 (×3): 25 mg via ORAL
  Filled 2024-09-06 (×3): qty 1

## 2024-09-06 MED ORDER — SODIUM CHLORIDE 0.9% IV SOLUTION: Freq: Once | INTRAVENOUS | Status: DC

## 2024-09-06 MED ORDER — ACETAMINOPHEN 500 MG PO TABS
1000.0000 mg | ORAL_TABLET | Freq: Four times a day (QID) | ORAL | Status: DC | PRN
  Administered 2024-09-06 – 2024-09-08 (×3): 1000 mg via ORAL
  Filled 2024-09-06 (×4): qty 2

## 2024-09-06 MED ORDER — METOPROLOL SUCCINATE ER 50 MG PO TB24
150.0000 mg | ORAL_TABLET | Freq: Every day | ORAL | Status: DC
  Administered 2024-09-07 – 2024-09-09 (×3): 150 mg via ORAL
  Filled 2024-09-06 (×2): qty 1
  Filled 2024-09-06: qty 6

## 2024-09-06 MED ORDER — ATORVASTATIN CALCIUM 40 MG PO TABS
40.0000 mg | ORAL_TABLET | Freq: Every day | ORAL | Status: DC
  Administered 2024-09-07 – 2024-09-09 (×3): 40 mg via ORAL
  Filled 2024-09-06 (×3): qty 1

## 2024-09-06 MED ORDER — TRAMADOL HCL 50 MG PO TABS
50.0000 mg | ORAL_TABLET | Freq: Four times a day (QID) | ORAL | Status: DC | PRN
  Administered 2024-09-07 – 2024-09-08 (×2): 50 mg via ORAL
  Filled 2024-09-06 (×2): qty 1

## 2024-09-06 MED ORDER — DRONEDARONE HCL 400 MG PO TABS
400.0000 mg | ORAL_TABLET | Freq: Two times a day (BID) | ORAL | Status: DC
  Administered 2024-09-07 – 2024-09-09 (×5): 400 mg via ORAL
  Filled 2024-09-06 (×6): qty 1

## 2024-09-06 NOTE — ED Provider Triage Note (Signed)
 Emergency Medicine Provider Triage Evaluation Note  Ryan [name] , a 68 y.o. male  was evaluated in triage.  Pt complains of low hemoglobin on recent labs.  Patient had lab work drawn yesterday and was found to have a hemoglobin of 6.8.  He was being evaluated for recent dizziness and shortness of breath with exertion as well as generally feeling fatigued for the last several weeks.  He is taking Xarelto  for A-fib.  He has not missed any of these doses and denies any falls or head injuries.  He reports an episode a few weeks ago of blood in stool that was dark red in color.  He denies any frank blood in stool since that time and denies dark or black stool.  He denies palpitations, syncope, chest pain.  Denies increase in bruising.  Review of Systems  Positive: Shortness of breath and dizziness with exertion.  Generalized weakness. Negative: Falls, syncope, chest pain, frank hematochezia, melena, hematuria.  N/V/D.  Physical Exam  BP (!) 147/82 (BP Location: Right Wrist)   Pulse 83   Temp 98.4 F (36.9 C) (Oral)   Resp 16   SpO2 98%  Gen:   Awake, no distress  Resp:  Normal effort with clear lung sounds throughout MSK:   Moves extremities without difficulty.  Seated in wheelchair Other:  Skin is warm and dry without pallor.  Currently regular rhythm and rate.  Medical Decision Making  Medically screening exam initiated at 1:10 PM.  Appropriate orders placed.  Ryan [name] was informed that the remainder of the evaluation will be completed by another provider, this initial triage assessment does not replace that evaluation, and the importance of remaining in the ED until their evaluation is complete.    Rosina Almarie LABOR, PA-C 09/06/24 1318

## 2024-09-06 NOTE — H&P (Signed)
 Date: 09/06/2024               Patient Name:  Ryan [name] MRN: [id number]  DOB: February 11, 1956 Age / Sex: 68 y.o., male   PCP: Pia Kerney SQUIBB, MD         Medical Service: Internal Medicine Teaching Service         Attending Physician: Dr. Reyes Fenton      First Contact: Dr. Penne Mori, DO    Second Contact: Dr. Lonni Africa, DO         Pager Information: First Contact Pager: [phone]   Second Contact Pager: [phone]   SUBJECTIVE   Chief Complaint: Shortness of breath  History of Present Illness: AGAPITO [name] is a 68 y.o. male with PMH of HTN, TIA in 2019, fatty liver disease, obesity, dilated aortic root, arthritis, OSA not on CPAP, Obesity, and paroxysmal atrial fibrillation w/ 3 DCCVs this year.   Patient presented to the Emergency department from his PCP office after his Hgb was reported to be low. Pt has had worsening SOB over the last several months, and it has been markedly worse over the last few weeks. He has been unable to walk from his home to car without significant distress. He breaths fine when he is swimming which is most days of the week to exercise. He is vigilant at watching his stools for signs of blood, and had a few days of black tarry stool a few weeks ago, but none since then.   Has also endorsed more frequent headaches than normal over the last few weeks. Takes up to 2 tylenol  a day for the headaches, and sometimes takes Aleve  in place of his Tramadol if his arthritis is worse than normal. Has chronic pitting edema on his legs and takes 40-60 Lasix  daily based on his swelling.    Endorses headaches, arthritis, leg swelling, SOB with exertion as above. Denies Fever, abdominal pain, dark or bloody stools in the last several weeks.   ED Course: Labs significant for Hgb 6.7 (repeat 7.8 was iSTAT), MCV 71, WBC 13.4, FOBT negative, retic fraction 33.1%. Imaging: CXR negative Received: No medications No consults  Meds:  Patient reported:  Home  meds - Tramadol 50mg  daily for arthritis - Lipitor 40 - Multaq  - for Atrial Fibrillation rhythm control - Lasix  60 or 40 qd - Losartan  25 daily daily - Metoprolol  succ 150mg  daily - Omeprazole  20mg  daily - Xarelto  20mg  daily  No outpatient medications have been marked as taking for the 09/06/24 encounter [facility] Main Encounter).    Past Medical History PAF has required 3 recent DCCVs this year, each was successful. Was switched from Eliquis  to Xarelto  for lightheadedness.   Past Surgical History Past Surgical History:  Procedure Laterality Date   CARDIOVERSION N/A 12/04/2023   Procedure: CARDIOVERSION;  Surgeon: Barbaraann Darryle Ned, MD;  Location: [facility] INVASIVE CV LAB;  Service: Cardiovascular;  Laterality: N/A;   CARDIOVERSION N/A 06/16/2024   Procedure: CARDIOVERSION;  Surgeon: Raford Riggs, MD;  Location: [facility] INVASIVE CV LAB;  Service: Cardiovascular;  Laterality: N/A;   CARDIOVERSION N/A 07/15/2024   Procedure: CARDIOVERSION;  Surgeon: Kate Lonni CROME, MD;  Location: [facility] INVASIVE CV LAB;  Service: Cardiovascular;  Laterality: N/A;   COLONOSCOPY W/ POLYPECTOMY  06/27/2009   FLEXIBLE SIGMOIDOSCOPY     KNEE ARTHROSCOPY Right 11/2003   Dr. Heide   KNEE ARTHROSCOPY Left 05/24/2012   torn meniscus    TONSILLECTOMY      Social:  PCP:  Pia,  Kerney SQUIBB, MD  Substances: -Tobacco: Quit 5-10 years ago  Family History:  Family History  Problem Relation Age of Onset   Hypertension Father    Hypertension Mother      Allergies: Allergies as of 09/06/2024   (No Known Allergies)    Review of Systems: A complete ROS was negative except as per HPI.   OBJECTIVE:   Physical Exam: Blood pressure 128/77, pulse 80, temperature 97.8 F (36.6 C), temperature source Oral, resp. rate 19, SpO2 100%.   Physical Exam Vitals and nursing note reviewed. Exam conducted with a chaperone present.  Constitutional:      General: He is not in acute distress.    Appearance: He is obese.  He is not toxic-appearing.  Neck:     Vascular: No carotid bruit.  Cardiovascular:     Rate and Rhythm: Normal rate and regular rhythm.     Pulses: Normal pulses.     Heart sounds: Heart sounds are distant. No murmur heard. Pulmonary:     Effort: Pulmonary effort is normal. No respiratory distress.     Breath sounds: Normal breath sounds. No stridor. No wheezing or rhonchi.  Abdominal:     General: There is no distension.     Palpations: Abdomen is soft.     Tenderness: There is no abdominal tenderness.  Musculoskeletal:     Right lower leg: 4+ Pitting Edema present.     Left lower leg: 4+ Pitting Edema present.  Skin:    General: Skin is warm and dry.  Neurological:     Mental Status: He is alert and oriented to person, place, and time.    Labs: CBC    Component Value Date/Time   WBC 13.4 (H) 09/06/2024 1316   RBC 3.28 (L) 09/06/2024 1747   RBC 3.24 (L) 09/06/2024 1316   HGB 7.8 (L) 09/06/2024 1323   HGB 11.3 (L) 07/01/2024 1159   HCT 23.0 (L) 09/06/2024 1323   HCT 36.5 (L) 07/01/2024 1159   PLT 346 09/06/2024 1316   PLT 286 07/01/2024 1159   MCV 71.0 (L) 09/06/2024 1316   MCV 85 07/01/2024 1159   MCH 20.7 (L) 09/06/2024 1316   MCHC 29.1 (L) 09/06/2024 1316   RDW 17.2 (H) 09/06/2024 1316   RDW 15.0 07/01/2024 1159   LYMPHSABS 2.3 09/06/2024 1316   MONOABS 1.4 (H) 09/06/2024 1316   EOSABS 0.2 09/06/2024 1316   BASOSABS 0.1 09/06/2024 1316     CMP     Component Value Date/Time   NA 136 09/06/2024 1323   NA 140 07/01/2024 1159   K 3.9 09/06/2024 1323   CL 98 09/06/2024 1323   CO2 21 (L) 09/06/2024 1316   GLUCOSE 103 (H) 09/06/2024 1323   BUN 14 09/06/2024 1323   BUN 13 07/01/2024 1159   CREATININE 1.10 09/06/2024 1323   CREATININE 0.90 04/05/2024 0832   CALCIUM  9.0 09/06/2024 1316   PROT 6.7 09/06/2024 1316   ALBUMIN 3.5 09/06/2024 1316   AST 27 09/06/2024 1316   AST 27 04/05/2024 0832   ALT 25 09/06/2024 1316   ALT 29 04/05/2024 0832   ALKPHOS 93  09/06/2024 1316   BILITOT 0.7 09/06/2024 1316   BILITOT 0.6 04/05/2024 0832   GFRNONAA >60 09/06/2024 1316   GFRNONAA >60 04/05/2024 0832   GFRAA 98 04/21/2018 0717    Imaging:  DG Chest 2 View Result Date: 09/06/2024 CLINICAL DATA:  Shortness of breath. EXAM: CHEST - 2 VIEW COMPARISON:  Chest radiograph dated 08/03/2024.  FINDINGS: Shallow inspiration. No focal consolidation, pleural effusion or pneumothorax. Stable cardiomegaly. No acute osseous pathology. IMPRESSION: 1. No active cardiopulmonary disease. 2. Cardiomegaly. Electronically Signed   By: Vanetta Chou M.D.   On: 09/06/2024 13:54     EKG: personally reviewed my interpretation is NSR, QTcB .  ASSESSMENT & PLAN:   Assessment & Plan by Problem: Principal Problem:   Anemia Active Problems:   Atrial fibrillation (HCC)   Shortness of breath   (HFpEF) heart failure with preserved ejection fraction (HCC)   Arthritis   Ryan [name] is a 68 y.o. person living with a history of PMH of HTN, TIA in 2019, fatty liver disease, obesity, dilated aortic root, arthritis, OSA not on CPAP, Obesity, and paroxysmal atrial fibrillation w/ 3 DCCVs this year who presented with shortness of breath and anemia and admitted for anemia on hospital day 0  # Anemia # Shortness of Breath Pt presented with low Hgb of 6.7 and was sent in from an outpatient provider for concern of low Hgb as well. Baseline Hgb ~14. Pt denies any recent history of blood loss, but reports likely bloody stools for a few days a couple of weeks ago. Has had worsening SOB over the last few months, markedly worse over the last few weeks. FOBT negative. OSA but does not use a CPAP. Could consider LE doppler vs PE study pending clinical course. - Transfuse 2 unit PRBC - Consult GI in the AM - AM CBC, BMP - VBG (to assess for hypoxemia related to body habitus) - TSH - IV Iron in AM  # HFpEF Pt is fluid overloaded on exam and has been on Lasix  for managing his total  fluid. Has put on ~30lbs in the last year which is suspected currently to be from fluid overload. Fluid overload is chronic, no clear JVD, no orthopnnea, so do not think ECHO is necessary at this time. - Lasix  40mg  BID PO - IV Lasix  in AM after blood transfusion - BNP  # Atrial Fibrillation Pt has been controlled on Metoprolol  and Multaq . Recent history of 3 DCCVs this year. Has been on Eliquis  in the past, but this was discontinued for lightheadedness which was improved when he was switched to Xarelto . - Metoprolol  Succinate 150mg  daily - Continue home Multaq  400 BID w/ meals PO - Holding home Xarelto  20mg  daily until   # Chronic Health Conditions Pt has the following chronic health conditions which are being managed with the following home medicaitons: - Tramadol 50mg  Q6H for Arthritic pain - Lipitor 40mg  daily for HLD - Losartan  25mg  daily for HTN - Pantoprazole  40mg  daily for reflux  Best practice: Diet: Normal VTE: SCDs Code: Full Code  Disposition planning: Prior to Admission Living Arrangement: Home, living with family Anticipated Discharge Location: Home  Dispo: Admit patient to Inpatient with expected length of stay greater than 2 midnights.  Signed: Lera Nancyann NOVAK, DO - PGY1 Internal Medicine Resident  09/06/2024, 7:03 PM  On Call pager: [phone]

## 2024-09-06 NOTE — Hospital Course (Addendum)
 Anemia Shortness of Breath Patient has had a history of worsening shortness of breath over the last several months which has gotten markedly worse over the last few weeks. He saw his PCP on 11/18 where CBC showed Hgb in 6 range, after which he came to the ED for blood transfusion and further evaluation. Iron  studies were profoundly low. He received 2 units of blood which brought his Hgb from 6.7 to 7.5 and GI was consulted for concern for active bleed, and patient received a 500mg  iron  infusion   GI workup GI was consulted for concern of active GI bleed d/t anemia. Significant history for large tubovillous adenoma in 2010 with recommended 3 year colonoscopy follow up which was not done and it was replaced with Cologuard. EGD and Colonoscopy were performed here on 11/20.  -EGD with likely short segment Barrett's esophagus and some benign gastric polyps, but otherwise pretty normal.   -Colonoscopy with 11 polyps, all removed. Otherwise no bleeding or stigmata of recent bleeding noted on either procedure.   Atrial Fibrillation Pt has a history of PAF which has required 3 DCCVs this year and has been controlled on Metoprolol  and Xarelto . Xarelto  was held during the workup for the GI bleed and continued on 11/21. There were no rate or rhythm concerns that arose during the patient's hospital stay.  HFpEF Pt has been on Lasix  40-60mg  daily for management of fluid buildup around his legs. On initial exam, pt had 4+ pitting edema which he said had become chronic. IV Lasix  was started and his home dose was increased.

## 2024-09-06 NOTE — ED Triage Notes (Signed)
 Pt to er, pt states that his PMD sent him over for a blood transfusion, states that they did lab work and his md believes that he is bleeding from somewhere. States that he is also on a blood thinner for his a fib.

## 2024-09-06 NOTE — ED Provider Notes (Signed)
 [place] [facility] AT [facility] Provider Note   CSN: [id number] Arrival date & time: 09/06/24  1247     Patient presents with: GI Bleeding   Ryan [name] is a 68 y.o. male.   HPI   Patient has history of hypertension, venous insufficiency, hypercholesterolemia, diverticulosis, fatty liver disease, degenerative joint disease, TIA.  Patient states he was following up with his doctor for a routine follow-up.  Patient had been having some issues with dizziness and dyspnea on exertion.  He also had been feeling fatigued.  At the doctor's office today patient was noted to have low hemoglobin.  He was told he need to come to the hospital for blood transfusion and admission to the hospital.  Patient has not noticed any blood in his stool.  He is not having any chest pain.  No fevers.  He has not had any hematemesis.  Prior to Admission medications   Medication Sig Start Date End Date Taking? Authorizing Provider  atorvastatin  (LIPITOR) 40 MG tablet Take 1 tablet (40 mg total) by mouth daily. 02/23/18   Christi Glendia HERO, MD  Cholecalciferol (VITAMIN D3) 125 MCG (5000 UT) TABS Take 5,000 Units by mouth daily.    [provider]  Collagen-Vitamin C-Biotin (COLLAGEN PO) Take 3 capsules by mouth daily. 5000 units total    [provider]  dronedarone  (MULTAQ ) 400 MG tablet Take 1 tablet (400 mg total) by mouth 2 (two) times daily with a meal. 07/29/24   Terra Fairy PARAS, PA-C  furosemide  (LASIX ) 20 MG tablet Take 20 mg by mouth daily as needed for fluid or edema. 06/13/24   [provider]  furosemide  (LASIX ) 40 MG tablet TAKE 1 TABLET DAILY 04/23/18   Christi Glendia HERO, MD  losartan  (COZAAR ) 25 MG tablet TAKE 1 TABLET DAILY 08/19/24   Terra Fairy PARAS, PA-C  MAGNESIUM  GLYCINATE PO Take 5,000 mg by mouth daily.    [provider]  metoprolol  succinate (TOPROL -XL) 50 MG 24 hr tablet Take 3 tablets (150 mg total) by mouth daily. Take with or  immediately following a meal. 07/29/24   Terra Fairy PARAS, PA-C  Multiple Vitamins-Minerals (ONE-A-DAY MENS 50+) TABS Take 1 tablet by mouth daily. Silver 50+    [provider]  omeprazole  (PRILOSEC ) 20 MG capsule Take 20 mg by mouth daily.    [provider]  rivaroxaban  (XARELTO ) 20 MG TABS tablet Take 1 tablet (20 mg total) by mouth daily with supper. 06/10/24   Terra Fairy PARAS, PA-C  traMADol (ULTRAM) 50 MG tablet Take 50 mg by mouth every 6 (six) hours as needed (Arthritis). 11/17/23   [provider]    Allergies: Patient has no known allergies.    Review of Systems  Updated Vital Signs BP 128/77 (BP Location: Left Arm)   Pulse 80   Temp 97.8 F (36.6 C) (Oral)   Resp 19   SpO2 100%   Physical Exam Vitals and nursing note reviewed.  Constitutional:      Appearance: He is well-developed. He is not diaphoretic.  HENT:     Head: Normocephalic and atraumatic.     Right Ear: External ear normal.     Left Ear: External ear normal.  Eyes:     General: No scleral icterus.       Right eye: No discharge.        Left eye: No discharge.     Conjunctiva/sclera: Conjunctivae normal.  Neck:     Trachea: No  tracheal deviation.  Cardiovascular:     Rate and Rhythm: Normal rate and regular rhythm.  Pulmonary:     Effort: Pulmonary effort is normal. No respiratory distress.     Breath sounds: Normal breath sounds. No stridor. No wheezing or rales.  Abdominal:     General: Bowel sounds are normal. There is no distension.     Palpations: Abdomen is soft.     Tenderness: There is no abdominal tenderness. There is no guarding or rebound.  Genitourinary:    Comments: No gross blood noted on rectal exam no mass Musculoskeletal:        General: No tenderness or deformity.     Cervical back: Neck supple.  Skin:    General: Skin is warm and dry.     Findings: No rash.  Neurological:     General: No focal deficit present.     Mental Status: He is alert.      Cranial Nerves: No cranial nerve deficit, dysarthria or facial asymmetry.     Sensory: No sensory deficit.     Motor: No abnormal muscle tone or seizure activity.     Coordination: Coordination normal.  Psychiatric:        Mood and Affect: Mood normal.     (all labs ordered are listed, but only abnormal results are displayed) Labs Reviewed  COMPREHENSIVE METABOLIC PANEL WITH GFR - Abnormal; Notable for the following components:      Result Value   CO2 21 (*)    Glucose, Bld 101 (*)    Anion gap 17 (*)    All other components within normal limits  CBC WITH DIFFERENTIAL/PLATELET - Abnormal; Notable for the following components:   WBC 13.4 (*)    RBC 3.24 (*)    Hemoglobin 6.7 (*)    HCT 23.0 (*)    MCV 71.0 (*)    MCH 20.7 (*)    MCHC 29.1 (*)    RDW 17.2 (*)    nRBC 0.5 (*)    Neutro Abs 9.4 (*)    Monocytes Absolute 1.4 (*)    Abs Immature Granulocytes 0.10 (*)    All other components within normal limits  PROTIME-INR - Abnormal; Notable for the following components:   Prothrombin Time 18.2 (*)    INR 1.4 (*)    All other components within normal limits  I-STAT CHEM 8, ED - Abnormal; Notable for the following components:   Glucose, Bld 103 (*)    Calcium , Ion 1.13 (*)    Hemoglobin 7.8 (*)    HCT 23.0 (*)    All other components within normal limits  VITAMIN B12  FOLATE  IRON AND TIBC  FERRITIN  RETICULOCYTES  POC OCCULT BLOOD, ED  TYPE AND SCREEN  ABO/RH  PREPARE RBC (CROSSMATCH)    EKG: EKG Interpretation Date/Time:  Tuesday September 06 2024 13:25:25 EST Ventricular Rate:  85 PR Interval:  236 QRS Duration:  134 QT Interval:  412 QTC Calculation: 490 R Axis:   70  Text Interpretation: Sinus rhythm with 1st degree A-V block Right bundle branch block similar to prior Confirmed by Franklyn Gills [phone]) on 09/06/2024 2:50:01 PM  Radiology: DG Chest 2 View Result Date: 09/06/2024 CLINICAL DATA:  Shortness of breath. EXAM: CHEST - 2 VIEW COMPARISON:   Chest radiograph dated 08/03/2024. FINDINGS: Shallow inspiration. No focal consolidation, pleural effusion or pneumothorax. Stable cardiomegaly. No acute osseous pathology. IMPRESSION: 1. No active cardiopulmonary disease. 2. Cardiomegaly. Electronically Signed   By: Vanetta  Radparvar M.D.   On: 09/06/2024 13:54     .Critical Care  Performed by: Randol Simmonds, MD Authorized by: Randol Simmonds, MD   Critical care provider statement:    Critical care time (minutes):  30   Critical care was time spent personally by me on the following activities:  Development of treatment plan with patient or surrogate, discussions with consultants, evaluation of patient's response to treatment, examination of patient, ordering and review of laboratory studies, ordering and review of radiographic studies, ordering and performing treatments and interventions, pulse oximetry, re-evaluation of patient's condition and review of old charts    Medications Ordered in the ED  0.9 %  sodium chloride  infusion (Manually program via Guardrails IV Fluids) (has no administration in time range)    Clinical Course as of 09/06/24 1747  Tue Sep 06, 2024  1544 CBC with Differential(!!) Hemoglobin decreased at 6.7.  Metabolic panel unremarkable.  INR increased at 1.4 [JK]  1601 POC occult blood, ED Negative [JK]  1747 Case discussed with Dr. Cynda regarding admission [JK]    Clinical Course User Index [JK] Randol Simmonds, MD                                 Medical Decision Making Problems Addressed: Acute anemia: acute illness or injury that poses a threat to life or bodily functions  Amount and/or Complexity of Data Reviewed Labs: ordered. Decision-making details documented in ED Course.  Risk Prescription drug management. Decision regarding hospitalization.   Patient presents ED for evaluation of anemia.  Patient has not noticed any blood in the stool but he is on anticoagulants and at risk for GI bleeding.  Hemoglobin is  decreased at 6.7.  He does have a low MCV suggestive of iron deficiency anemia..  I will send off anemia panel.  Patient is Hemoccult negative.  Occult GI bleeding still is a consideration.  Patient is hemodynamically stable in the ED but will require blood transfusion.  I will consult with the medical service for admission     Final diagnoses:  Acute anemia    ED Discharge Orders     None          Randol Simmonds, MD 09/06/24 1747

## 2024-09-07 ENCOUNTER — Other Ambulatory Visit: Payer: Self-pay

## 2024-09-07 DIAGNOSIS — D128 Benign neoplasm of rectum: Secondary | ICD-10-CM | POA: Diagnosis not present

## 2024-09-07 DIAGNOSIS — Z8673 Personal history of transient ischemic attack (TIA), and cerebral infarction without residual deficits: Secondary | ICD-10-CM | POA: Diagnosis not present

## 2024-09-07 DIAGNOSIS — G4733 Obstructive sleep apnea (adult) (pediatric): Secondary | ICD-10-CM | POA: Diagnosis present

## 2024-09-07 DIAGNOSIS — I480 Paroxysmal atrial fibrillation: Secondary | ICD-10-CM | POA: Diagnosis present

## 2024-09-07 DIAGNOSIS — R946 Abnormal results of thyroid function studies: Secondary | ICD-10-CM

## 2024-09-07 DIAGNOSIS — F419 Anxiety disorder, unspecified: Secondary | ICD-10-CM | POA: Diagnosis present

## 2024-09-07 DIAGNOSIS — K6389 Other specified diseases of intestine: Secondary | ICD-10-CM | POA: Diagnosis not present

## 2024-09-07 DIAGNOSIS — Z7901 Long term (current) use of anticoagulants: Secondary | ICD-10-CM | POA: Diagnosis not present

## 2024-09-07 DIAGNOSIS — D120 Benign neoplasm of cecum: Secondary | ICD-10-CM | POA: Diagnosis not present

## 2024-09-07 DIAGNOSIS — D509 Iron deficiency anemia, unspecified: Secondary | ICD-10-CM | POA: Diagnosis present

## 2024-09-07 DIAGNOSIS — I10 Essential (primary) hypertension: Secondary | ICD-10-CM | POA: Diagnosis not present

## 2024-09-07 DIAGNOSIS — E7800 Pure hypercholesterolemia, unspecified: Secondary | ICD-10-CM | POA: Diagnosis present

## 2024-09-07 DIAGNOSIS — I5032 Chronic diastolic (congestive) heart failure: Secondary | ICD-10-CM | POA: Diagnosis present

## 2024-09-07 DIAGNOSIS — Z8249 Family history of ischemic heart disease and other diseases of the circulatory system: Secondary | ICD-10-CM | POA: Diagnosis not present

## 2024-09-07 DIAGNOSIS — I110 Hypertensive heart disease with heart failure: Secondary | ICD-10-CM | POA: Diagnosis present

## 2024-09-07 DIAGNOSIS — Z79899 Other long term (current) drug therapy: Secondary | ICD-10-CM

## 2024-09-07 DIAGNOSIS — K317 Polyp of stomach and duodenum: Secondary | ICD-10-CM | POA: Diagnosis present

## 2024-09-07 DIAGNOSIS — Z87891 Personal history of nicotine dependence: Secondary | ICD-10-CM | POA: Diagnosis not present

## 2024-09-07 DIAGNOSIS — R0602 Shortness of breath: Secondary | ICD-10-CM | POA: Diagnosis present

## 2024-09-07 DIAGNOSIS — D123 Benign neoplasm of transverse colon: Secondary | ICD-10-CM | POA: Diagnosis not present

## 2024-09-07 DIAGNOSIS — I4811 Longstanding persistent atrial fibrillation: Secondary | ICD-10-CM

## 2024-09-07 DIAGNOSIS — K219 Gastro-esophageal reflux disease without esophagitis: Secondary | ICD-10-CM | POA: Diagnosis present

## 2024-09-07 DIAGNOSIS — D124 Benign neoplasm of descending colon: Secondary | ICD-10-CM | POA: Diagnosis not present

## 2024-09-07 DIAGNOSIS — K921 Melena: Secondary | ICD-10-CM | POA: Diagnosis present

## 2024-09-07 DIAGNOSIS — D649 Anemia, unspecified: Secondary | ICD-10-CM | POA: Diagnosis present

## 2024-09-07 DIAGNOSIS — D125 Benign neoplasm of sigmoid colon: Secondary | ICD-10-CM | POA: Diagnosis not present

## 2024-09-07 DIAGNOSIS — K2270 Barrett's esophagus without dysplasia: Secondary | ICD-10-CM | POA: Diagnosis present

## 2024-09-07 DIAGNOSIS — K641 Second degree hemorrhoids: Secondary | ICD-10-CM | POA: Diagnosis present

## 2024-09-07 LAB — CBC WITH DIFFERENTIAL/PLATELET
Abs Immature Granulocytes: 0.1 K/uL — ABNORMAL HIGH (ref 0.00–0.07)
Basophils Absolute: 0.1 K/uL (ref 0.0–0.1)
Basophils Relative: 1 %
Eosinophils Absolute: 0.2 K/uL (ref 0.0–0.5)
Eosinophils Relative: 1 %
HCT: 23 % — ABNORMAL LOW (ref 39.0–52.0)
Hemoglobin: 6.7 g/dL — CL (ref 13.0–17.0)
Immature Granulocytes: 1 %
Lymphocytes Relative: 17 %
Lymphs Abs: 2.3 K/uL (ref 0.7–4.0)
MCH: 20.7 pg — ABNORMAL LOW (ref 26.0–34.0)
MCHC: 29.1 g/dL — ABNORMAL LOW (ref 30.0–36.0)
MCV: 71 fL — ABNORMAL LOW (ref 80.0–100.0)
Monocytes Absolute: 1.4 K/uL — ABNORMAL HIGH (ref 0.1–1.0)
Monocytes Relative: 10 %
Neutro Abs: 9.4 K/uL — ABNORMAL HIGH (ref 1.7–7.7)
Neutrophils Relative %: 70 %
Platelets: 346 K/uL (ref 150–400)
RBC: 3.24 MIL/uL — ABNORMAL LOW (ref 4.22–5.81)
RDW: 17.2 % — ABNORMAL HIGH (ref 11.5–15.5)
WBC: 13.4 K/uL — ABNORMAL HIGH (ref 4.0–10.5)
nRBC: 0.5 % — ABNORMAL HIGH (ref 0.0–0.2)

## 2024-09-07 LAB — TYPE AND SCREEN
ABO/RH(D): O POS
Antibody Screen: NEGATIVE
Unit division: 0
Unit division: 0

## 2024-09-07 LAB — BLOOD GAS, VENOUS
Acid-Base Excess: 5.1 mmol/L — ABNORMAL HIGH (ref 0.0–2.0)
Bicarbonate: 30.5 mmol/L — ABNORMAL HIGH (ref 20.0–28.0)
O2 Saturation: 70.5 %
Patient temperature: 37
pCO2, Ven: 47 mmHg (ref 44–60)
pH, Ven: 7.42 (ref 7.25–7.43)
pO2, Ven: 42 mmHg (ref 32–45)

## 2024-09-07 LAB — BASIC METABOLIC PANEL WITH GFR
Anion gap: 13 (ref 5–15)
BUN: 12 mg/dL (ref 8–23)
CO2: 24 mmol/L (ref 22–32)
Calcium: 8.9 mg/dL (ref 8.9–10.3)
Chloride: 102 mmol/L (ref 98–111)
Creatinine, Ser: 0.94 mg/dL (ref 0.61–1.24)
GFR, Estimated: >60 mL/min (ref >60)
Glucose, Bld: 118 mg/dL — ABNORMAL HIGH (ref 70–99)
Potassium: 3.9 mmol/L (ref 3.5–5.1)
Sodium: 139 mmol/L (ref 135–145)

## 2024-09-07 LAB — CBC
HCT: 25.4 % — ABNORMAL LOW (ref 39.0–52.0)
Hemoglobin: 7.5 g/dL — ABNORMAL LOW (ref 13.0–17.0)
MCH: 21.7 pg — ABNORMAL LOW (ref 26.0–34.0)
MCHC: 29.5 g/dL — ABNORMAL LOW (ref 30.0–36.0)
MCV: 73.4 fL — ABNORMAL LOW (ref 80.0–100.0)
Platelets: 299 K/uL (ref 150–400)
RBC: 3.46 MIL/uL — ABNORMAL LOW (ref 4.22–5.81)
RDW: 18.5 % — ABNORMAL HIGH (ref 11.5–15.5)
WBC: 12.9 K/uL — ABNORMAL HIGH (ref 4.0–10.5)
nRBC: 0.4 % — ABNORMAL HIGH (ref 0.0–0.2)

## 2024-09-07 LAB — HEMOGLOBIN AND HEMATOCRIT, BLOOD
HCT: 29.6 % — ABNORMAL LOW (ref 39.0–52.0)
Hemoglobin: 8.7 g/dL — ABNORMAL LOW (ref 13.0–17.0)

## 2024-09-07 LAB — T4, FREE: Free T4: 0.79 ng/dL (ref 0.61–1.12)

## 2024-09-07 LAB — BPAM RBC
Blood Product Expiration Date: 202512102359
Blood Product Expiration Date: 202512122359
ISSUE DATE / TIME: 202511181838
ISSUE DATE / TIME: 202511182103
Unit Type and Rh: 5100
Unit Type and Rh: 5100

## 2024-09-07 LAB — TSH: TSH: 5.351 u[IU]/mL — ABNORMAL HIGH (ref 0.350–4.500)

## 2024-09-07 MED ORDER — SIMETHICONE 80 MG PO CHEW
240.0000 mg | CHEWABLE_TABLET | Freq: Once | ORAL | Status: AC
  Administered 2024-09-07: 240 mg via ORAL
  Filled 2024-09-07: qty 3

## 2024-09-07 MED ORDER — SODIUM CHLORIDE 0.9 % IV SOLN
500.0000 mg | Freq: Once | INTRAVENOUS | Status: AC
  Administered 2024-09-07: 500 mg via INTRAVENOUS
  Filled 2024-09-07: qty 25

## 2024-09-07 MED ORDER — NA SULFATE-K SULFATE-MG SULF 17.5-3.13-1.6 GM/177ML PO SOLN
0.5000 | Freq: Once | ORAL | Status: AC
  Administered 2024-09-07: 177 mL via ORAL
  Filled 2024-09-07: qty 1

## 2024-09-07 MED ORDER — POTASSIUM CHLORIDE 20 MEQ PO PACK
20.0000 meq | PACK | Freq: Once | ORAL | Status: AC
  Administered 2024-09-07: 20 meq via ORAL
  Filled 2024-09-07: qty 1

## 2024-09-07 MED ORDER — SODIUM CHLORIDE 0.9 % IV SOLN: INTRAVENOUS | Status: AC

## 2024-09-07 MED ORDER — FUROSEMIDE 10 MG/ML IJ SOLN
80.0000 mg | Freq: Two times a day (BID) | INTRAMUSCULAR | Status: DC
  Administered 2024-09-07 – 2024-09-08 (×3): 80 mg via INTRAVENOUS
  Filled 2024-09-07 (×3): qty 8

## 2024-09-07 MED ORDER — NA SULFATE-K SULFATE-MG SULF 17.5-3.13-1.6 GM/177ML PO SOLN
0.5000 | Freq: Once | ORAL | Status: AC
  Administered 2024-09-07: 177 mL via ORAL

## 2024-09-07 MED ORDER — ORAL CARE MOUTH RINSE
15.0000 mL | OROMUCOSAL | Status: DC | PRN
  Administered 2024-09-08: 15 mL via OROMUCOSAL

## 2024-09-07 MED ORDER — IRON SUCROSE 500 MG IVPB - SIMPLE MED
500.0000 mg | Freq: Once | INTRAVENOUS | Status: DC
  Filled 2024-09-07: qty 275

## 2024-09-07 MED ORDER — BISACODYL 5 MG PO TBEC
10.0000 mg | DELAYED_RELEASE_TABLET | Freq: Once | ORAL | Status: AC
  Administered 2024-09-07: 10 mg via ORAL
  Filled 2024-09-07: qty 2

## 2024-09-07 NOTE — H&P (View-Only) (Signed)
 Consultation  Referring Provider: Medicine service/hatcher Primary Care Physician:  Pia Kerney SQUIBB, MD Primary Gastroenterologist:  Dr. Alm Patterson/remote 2010  Reason for Consultation: Profound iron deficiency anemia, symptomatic with shortness of breath and fatigue  HPI: Ryan [name] is a 68 y.o. male with history of atrial fibrillation status post previous cardioversions, currently on Xarelto , prior history of TIA, hypertension, hyperlipidemia, obesity, and diastolic dysfunction.  Also with history of sleep apnea/no CPAP. Patient says that he has had a rough year healthwise, had several infectious illnesses early in the year i.e. COVID, influenza etc.  He had been working on getting himself reconditioned and had been feeling better, had been going to the gym swimming and had gotten to the point where he was able to swim 40 laps.  He has had gradual onset of fatigue, weakness over the past 3 months or so.  Over the past couple of months had gotten to the point where he really was not able to exercise at all and had to pace himself just to get to the swimming pool.  Progressive shortness of breath now over the past several weeks as well.  Denies any chest pain. He had seen his PCP on Monday had labs done and was told to come to the ER as his hemoglobin was in the 6 range. He says he has been paying attention to his bowels ever since he was started on blood thinners.  He did have an episode a few weeks back where he had notably darker stools over couple of days.  He says he was paying attention but this subsided and his stools returned to normal so he did not think anything of it.  He has not noted any bright red blood per rectum.  He has no complaint of abdominal pain or discomfort, no changes in bowel habits, no heartburn or indigestion, no dysphagia. His most recent hemoglobin was 11.3 mid September 2025.  Labs in the ER 1118 showed WBC 13.4/hemoglobin 6.7/hematocrit 23.0/MCV of  71/platelets 346 Ferritin 4//serum iron 13/TIBC 522/iron sat 3 B12 and folate within normal limits Stool Hemoccult negative BNP 192 INR 1.4 Sodium 136/potassium 3.9/BUN 13/creatinine 1.0 LFTs within normal limits  Patient relates that he has been known to have an elevated white count, he is being followed by hematology and that has been monitored at this point in time on no active therapy as counts have been stable. He did have prior colonoscopy in 2010 per Dr. Jakie with finding of diverticulosis, there was 1 large polyp in the rectosigmoid removed and then a couple of other polyps also removed.  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  He was indicated for 3-year interval follow-up.  He says he has not had colonoscopy since then but says that his PCP has been monitoring him and he has had Cologuard's done.  Last did a Cologuard 3 to 4 years ago which he says was negative.   Last dose of Xarelto  yesterday.  Past Medical History:  Diagnosis Date   Anxiety    Arthritis    knees (03/05/2018)   Cigarette smoker    Diastolic dysfunction    Diverticulosis of colon    DJD (degenerative joint disease)    Fatty liver disease, nonalcoholic    Hemorrhoids    Hx of colonic polyps    Hypercholesteremia    Hypertension    Obesity    Seborrheic dermatitis    Slurred speech  Stenosis of rectum and anus    TIA (transient ischemic attack) 03/05/2018   Venous insufficiency     Past Surgical History:  Procedure Laterality Date   CARDIOVERSION N/A 12/04/2023   Procedure: CARDIOVERSION;  Surgeon: Barbaraann Darryle Ned, MD;  Location: [facility] INVASIVE CV LAB;  Service: Cardiovascular;  Laterality: N/A;   CARDIOVERSION N/A 06/16/2024   Procedure: CARDIOVERSION;  Surgeon: Raford Riggs, MD;  Location: [facility] INVASIVE CV LAB;  Service: Cardiovascular;  Laterality: N/A;   CARDIOVERSION N/A 07/15/2024   Procedure: CARDIOVERSION;  Surgeon:  Kate Lonni CROME, MD;  Location: [facility] INVASIVE CV LAB;  Service: Cardiovascular;  Laterality: N/A;   COLONOSCOPY W/ POLYPECTOMY  06/27/2009   FLEXIBLE SIGMOIDOSCOPY     KNEE ARTHROSCOPY Right 11/2003   Dr. Heide   KNEE ARTHROSCOPY Left 05/24/2012   torn meniscus    TONSILLECTOMY      Prior to Admission medications   Medication Sig Start Date End Date Taking? Authorizing Provider  albuterol  (VENTOLIN  HFA) 108 (90 Base) MCG/ACT inhaler Inhale 1 puff into the lungs every 6 (six) hours as needed for shortness of breath. 09/05/24  Yes [provider]  atorvastatin  (LIPITOR) 40 MG tablet Take 1 tablet (40 mg total) by mouth daily. 02/23/18  Yes Christi Glendia HERO, MD  Cholecalciferol (VITAMIN D3) 125 MCG (5000 UT) TABS Take 5,000 Units by mouth daily.   Yes [provider]  Collagen-Vitamin C-Biotin (COLLAGEN PO) Take 3 capsules by mouth daily. 5000 units total   Yes [provider]  dronedarone  (MULTAQ ) 400 MG tablet Take 1 tablet (400 mg total) by mouth 2 (two) times daily with a meal. 07/29/24  Yes Terra Fairy PARAS, PA-C  furosemide  (LASIX ) 20 MG tablet Take 20 mg by mouth daily as needed for fluid or edema. 06/13/24  Yes [provider]  furosemide  (LASIX ) 40 MG tablet TAKE 1 TABLET DAILY 04/23/18  Yes Christi Glendia HERO, MD  losartan  (COZAAR ) 25 MG tablet TAKE 1 TABLET DAILY 08/19/24  Yes Terra Fairy PARAS, PA-C  MAGNESIUM  GLYCINATE PO Take 5,000 mg by mouth daily.   Yes [provider]  metoprolol  succinate (TOPROL -XL) 50 MG 24 hr tablet Take 3 tablets (150 mg total) by mouth daily. Take with or immediately following a meal. 07/29/24  Yes Terra Fairy PARAS, PA-C  Multiple Vitamins-Minerals (ONE-A-DAY MENS 50+) TABS Take 1 tablet by mouth daily. Silver 50+   Yes [provider]  omeprazole  (PRILOSEC ) 20 MG capsule Take 20 mg by mouth daily.   Yes [provider]  rivaroxaban  (XARELTO ) 20 MG TABS tablet Take 1 tablet (20 mg total) by mouth  daily with supper. 06/10/24  Yes Terra Fairy PARAS, PA-C  traMADol (ULTRAM) 50 MG tablet Take 50 mg by mouth 2 (two) times daily. 11/17/23  Yes [provider]    Current Facility-Administered Medications  Medication Dose Route Frequency Provider Last Rate Last Admin   0.9 %  sodium chloride  infusion (Manually program via Guardrails IV Fluids)   Intravenous Once Randol Simmonds, MD   Held at 09/06/24 2007   0.9 %  sodium chloride  infusion   Intravenous Continuous Esterwood, Amy S, PA-C       acetaminophen  (TYLENOL ) tablet 1,000 mg  1,000 mg Oral Q6H PRN Harrie Lonni, DO   1,000 mg at 09/06/24 2005   atorvastatin  (LIPITOR) tablet 40 mg  40 mg Oral Daily Juberg, Christopher, DO   40 mg at 09/07/24 0948   bisacodyl (DULCOLAX) EC tablet 10 mg  10 mg Oral  Once [employer], Amy S, PA-C       dronedarone  (MULTAQ ) tablet 400 mg  400 mg Oral BID WC Juberg, Christopher, DO   400 mg at 09/07/24 0948   furosemide  (LASIX ) injection 80 mg  80 mg Intravenous BID Nooruddin, Saad, MD   80 mg at 09/07/24 0950   iron sucrose (VENOFER) 500 mg in sodium chloride  0.9 % 250 mL IVPB  500 mg Intravenous Once Hatcher, Jeffrey C, MD [phone] mL/hr at 09/07/24 0957 500 mg at 09/07/24 0957   losartan  (COZAAR ) tablet 25 mg  25 mg Oral Daily Juberg, Christopher, DO   25 mg at 09/07/24 9052   metoprolol  succinate (TOPROL -XL) 24 hr tablet 150 mg  150 mg Oral Daily Juberg, Christopher, DO   150 mg at 09/07/24 0947   Na Sulfate-K Sulfate-Mg Sulfate concentrate (SUPREP) kit 177 mL  0.5 kit Oral Once Esterwood, Amy S, PA-C       Followed by   Na Sulfate-K Sulfate-Mg Sulfate concentrate (SUPREP) kit 177 mL  0.5 kit Oral Once Esterwood, Amy S, PA-C       pantoprazole  (PROTONIX ) EC tablet 40 mg  40 mg Oral Daily Juberg, Christopher, DO   40 mg at 09/07/24 0947   simethicone (MYLICON) chewable tablet 240 mg  240 mg Oral Once Esterwood, Amy S, PA-C       Followed by   simethicone (MYLICON) chewable tablet 240 mg  240 mg  Oral Once Esterwood, Amy S, PA-C       traMADol (ULTRAM) tablet 50 mg  50 mg Oral Q6H PRN Harrie Bruckner, DO   50 mg at 09/07/24 0423   Current Outpatient Medications  Medication Sig Dispense Refill   albuterol  (VENTOLIN  HFA) 108 (90 Base) MCG/ACT inhaler Inhale 1 puff into the lungs every 6 (six) hours as needed for shortness of breath.     atorvastatin  (LIPITOR) 40 MG tablet Take 1 tablet (40 mg total) by mouth daily. 90 tablet 4   Cholecalciferol (VITAMIN D3) 125 MCG (5000 UT) TABS Take 5,000 Units by mouth daily.     Collagen-Vitamin C-Biotin (COLLAGEN PO) Take 3 capsules by mouth daily. 5000 units total     dronedarone  (MULTAQ ) 400 MG tablet Take 1 tablet (400 mg total) by mouth 2 (two) times daily with a meal. 6 tablet    furosemide  (LASIX ) 20 MG tablet Take 20 mg by mouth daily as needed for fluid or edema.     furosemide  (LASIX ) 40 MG tablet TAKE 1 TABLET DAILY 90 tablet 1   losartan  (COZAAR ) 25 MG tablet TAKE 1 TABLET DAILY 90 tablet 3   MAGNESIUM  GLYCINATE PO Take 5,000 mg by mouth daily.     metoprolol  succinate (TOPROL -XL) 50 MG 24 hr tablet Take 3 tablets (150 mg total) by mouth daily. Take with or immediately following a meal. 270 tablet 3   Multiple Vitamins-Minerals (ONE-A-DAY MENS 50+) TABS Take 1 tablet by mouth daily. Silver 50+     omeprazole  (PRILOSEC ) 20 MG capsule Take 20 mg by mouth daily.     rivaroxaban  (XARELTO ) 20 MG TABS tablet Take 1 tablet (20 mg total) by mouth daily with supper. 90 tablet 2   traMADol (ULTRAM) 50 MG tablet Take 50 mg by mouth 2 (two) times daily.      Allergies as of 09/06/2024   (No Known Allergies)    Family History  Problem Relation Age of Onset   Hypertension Father    Hypertension Mother     Social History  Socioeconomic History   Marital status: Widowed    Spouse name: Not on file   Number of children: 2   Years of education: Not on file   Highest education level: Not on file  Occupational History   Not on file   Tobacco Use   Smoking status: Former    Current packs/day: 0.00    Average packs/day: 1 pack/day for 27.0 years (27.0 ttl pk-yrs)    Types: Cigarettes    Start date: 10/20/1982    Quit date: 10/20/2009    Years since quitting: 14.8   Smokeless tobacco: Never   Tobacco comments:    Smoked for about 40 years - about 1ppd  Vaping Use   Vaping status: Never Used  Substance and Sexual Activity   Alcohol use: Yes    Alcohol/week: 8.0 standard drinks of alcohol    Types: 8 Cans of beer per week   Drug use: Never   Sexual activity: Not Currently  Other Topics Concern   Not on file  Social History Narrative   Not on file   Social Drivers of Health   Financial Resource Strain: Not on file  Food Insecurity: No Food Insecurity (11/03/2023)   Hunger Vital Sign    Worried About Running Out of Food in the Last Year: Never true    Ran Out of Food in the Last Year: Never true  Transportation Needs: No Transportation Needs (11/03/2023)   PRAPARE - Administrator, Civil Service (Medical): No    Lack of Transportation (Non-Medical): No  Physical Activity: Not on file  Stress: Not on file  Social Connections: Socially Isolated (10/20/2023)   Social Connection and Isolation Panel    Frequency of Communication with Friends and Family: More than three times a week    Frequency of Social Gatherings with Friends and Family: Once a week    Attends Religious Services: Never    Database Administrator or Organizations: No    Attends Banker Meetings: Never    Marital Status: Widowed  Intimate Partner Violence: Not At Risk (11/03/2023)   Humiliation, Afraid, Rape, and Kick questionnaire    Fear of Current or Ex-Partner: No    Emotionally Abused: No    Physically Abused: No    Sexually Abused: No    Review of Systems: Pertinent positive and negative review of systems were noted in the above HPI section.  All other review of systems was otherwise negative.   Physical  Exam: Vital signs in last 24 hours: Temp:  [97.8 F (36.6 C)-98.6 F (37 C)] 98.6 F (37 C) (11/19 0804) Pulse Rate:  [80-98] 92 (11/19 1000) Resp:  [12-19] 16 (11/19 1000) BP: (123-150)/(68-97) 150/92 (11/19 1000) SpO2:  [90 %-100 %] 100 % (11/19 1000)   General:   Alert,  Well-developed, well-nourished, obese older white male pleasant and cooperative in NAD, wife at bedside Head:  Normocephalic and atraumatic. Eyes:  Sclera clear, no icterus.   Conjunctiva pale Ears:  Normal auditory acuity. Nose:  No deformity, discharge,  or lesions. Mouth:  No deformity or lesions.   Neck:  Supple; no masses or thyromegaly. Lungs:  Clear throughout to auscultation.   No wheezes, crackles, or rhonchi.  Heart:  Regular rate and rhythm; no murmurs, clicks, rubs,  or gallops. Abdomen:  Soft, obese, nontender, no palpable mass or hepatosplenomegaly, bowel sounds are present Rectal: Documented heme-negative on admission Msk:  Symmetrical without gross deformities. . Pulses:  Normal pulses noted. Extremities:  Without clubbing or edema. Neurologic:  Alert and  oriented x4;  grossly normal neurologically. Skin:  Intact without significant lesions or rashes.. Psych:  Alert and cooperative. Normal mood and affect.  Intake/Output from previous day: 11/18 0701 - 11/19 0700 In: 2.7 [I.V.:2.7] Out: -  Intake/Output this shift: No intake/output data recorded.  Lab Results: Recent Labs    09/06/24 1316 09/06/24 1323 09/06/24 2337 09/07/24 0415  WBC 13.4*  --  15.5* 12.9*  HGB 6.7* 7.8* 8.0* 7.5*  HCT 23.0* 23.0* 27.1* 25.4*  PLT 346  --  323 299   BMET Recent Labs    09/06/24 1316 09/06/24 1323 09/07/24 0415  NA 136 136 139  K 3.9 3.9 3.9  CL 98 98 102  CO2 21*  --  24  GLUCOSE 101* 103* 118*  BUN 13 14 12   CREATININE 1.00 1.10 0.94  CALCIUM  9.0  --  8.9   LFT Recent Labs    09/06/24 1316  PROT 6.7  ALBUMIN 3.5  AST 27  ALT 25  ALKPHOS 93  BILITOT 0.7   PT/INR Recent  Labs    09/06/24 1316  LABPROT 18.2*  INR 1.4*   Hepatitis Panel No results for input(s): HEPBSAG, HCVAB, HEPAIGM, HEPBIGM in the last 72 hours.    IMPRESSION:  #50 68 year old white male presenting with profound microcytic/iron deficient anemia, symptomatic with progressive shortness of breath over the past several weeks, fatigue, exertional significant dyspnea. Outpatient labs per PCP earlier this week with finding of hemoglobin in the 6 range down from hemoglobin of 11.5 in mid September 2025. Patient on chronic Xarelto   Patient did have an episode of dark stool within the past month that lasted for 2 to 3 days by his report and then resolved and stools have looked normal since that time.  He also has history of a large tubulovillous adenomatous polyp removed from colonoscopy in 2010 as well as other adenomatous polyps.  No follow-up colonoscopy though recommended to have follow-up in 3 years. Cologuard -3 to 4 years ago  Suspect patient did have some active GI bleeding with the episode of dark stools several weeks ago and is probably had lower grade bleeding ongoing over the past couple of months. Etiology of blood loss not clear though concerned about possibility of colonic neoplasm as source, also consider AVMs, chronic gastropathy versus other.  #2 chronic anticoagulation-on Xarelto  last dose yesterday #3 atrial fibrillation status post cardioversions x 3 currently normal sinus rhythm #4 hyperlipidemia #5.  Morbid obesity #6.  Hypertension #7.  Sleep apnea no CPAP use #8 chronic leukocytosis-hematology monitoring  Plan; clear liquid diet today n.p.o. after midnight Discussed colonoscopy and EGD with the patient in detail including indications risks and benefits and he is agreeable to proceed.  Procedures will be done by Dr. San, tomorrow 09/08/2024. Plan bowel prep this evening Hold Xarelto  Patient has been transfused 2 units of packed RBCs and was receiving  IV iron currently Monitor hemoglobin GI will follow with you   Amy EsterwoodPA-C  09/07/2024, 11:50 AM    Attending physician's note   I have taken a history, reviewed the chart, and examined the patient. I performed a substantive portion of this encounter, including complete performance of at least one of the key components, in conjunction with the APP. I agree with the APP's note, impression, and recommendations with my edits.  68 year old male with medical history as outlined above, to include history of A-fib (on Xarelto ), prior TIA, HTN, obesity, along with  history of TVA in 2010, presents with symptomatic iron  deficiency anemia.  Has been having progressive SOB and DOE with decreasing exercise tolerance.  Did have dark stools a few weeks ago lasting 2-3 days, but none since then.  Otherwise no GI symptoms.  Last colonoscopy was 2010 and notable for a tubulovillous adenoma.  No colonoscopy since then.  Has been using Cologuard's which he reports had been negative.  Baseline Hgb appears to be 14-15 with MCV 88.  Reviewed most recent outpatient labs from  September which show downtrend at 11.3/36.5 with MCV/RDW 85/15.  BMP was normal.  On admission was noted to have H/H 6.7/23 with MCV/RDW 71/17, ferritin 4, iron  13, TIBC 522, sat 3%.  No prior iron  studies for comparison.  Normal B12 and folate.  Transfused 2 units RBCs and IV iron  now running.  1) Iron  deficiency anemia 2) Symptomatic anemia 3) History of tubulovillous adenoma - Posttransfusion CBC check with additional blood products as needed per protocol - IV iron  infusing now - CLD today - Bowel prep this evening with plan for EGD and colonoscopy tomorrow for diagnostic and therapeutic intent - Holding Xarelto   4) A-fib 5) Chronic anticoagulation - As above, holding Xarelto  in light of suspected GI bleed - Medical management of A-fib per admitting Medicine service  6) Obesity Morbid obesity with thick neck and short  thyromental distance.  Will need MAC assistance for sedation for endoscopic procedures  The indications, risks, and benefits of EGD and colonoscopy were explained to the patient in detail. Risks include but are not limited to bleeding, perforation, adverse reaction to medications, and cardiopulmonary compromise. Sequelae include but are not limited to the possibility of surgery, hospitalization, and mortality. The patient verbalized understanding and wished to proceed.   [address] DO, FACG [phone] office

## 2024-09-07 NOTE — Progress Notes (Addendum)
 HD#0 SUBJECTIVE:  Patient Summary: Ryan [name] is a 68 y.o. with a pertinent PMH of  HTN, TIA in 2019, fatty liver disease, obesity, dilated aortic root, arthritis, OSA not on CPAP, Obesity, and paroxysmal atrial fibrillation w/ 3 DCCVs this year, who presented with low Hgb and worsening SOB and admitted for Symptomatic Anemia.   Interim History:  11/19 There were no acute events overnight. Vital signs have been stable. Hgb is 7.5 from 6.7 after receiving 2 units of blood. Consulted GI. Ferritin 4, TSH 5.35.   On interview, patient feels fine as his SOB is only with exertion. His last drink of water was approximately 4am, last meal was dinner last night in the hospital, and he has been on sips and meds since then. States his legs are at his baseline.   Plan updates: IV lasix , GI consulted, NPO, IV iron    OBJECTIVE:  Vital Signs: Vitals:   09/07/24 0400 09/07/24 0419 09/07/24 0500 09/07/24 0804  BP: (!) 146/95  (!) 144/97 138/89  Pulse: 98  91 93  Resp: 15  12 16   Temp:  97.8 F (36.6 C)  98.6 F (37 C)  TempSrc:    Oral  SpO2: 99%  98% 99%   Supplemental O2: Room Air SpO2: 99 %  There were no vitals filed for this visit.   Intake/Output Summary (Last 24 hours) at 09/07/2024 9078 Last data filed at 09/06/2024 2128 Gross per 24 hour  Intake 2.67 ml  Output --  Net 2.67 ml   Net IO Since Admission: 2.67 mL [09/07/24 0921]  Physical Exam: Physical Exam Vitals and nursing note reviewed. Exam conducted with a chaperone present.  Constitutional:      General: He is not in acute distress.    Appearance: He is obese. He is not toxic-appearing.  Cardiovascular:     Rate and Rhythm: Normal rate and regular rhythm.     Heart sounds: Heart sounds are distant.  Pulmonary:     Effort: Pulmonary effort is normal. No respiratory distress.  Abdominal:     General: Bowel sounds are normal. There is no distension.     Palpations: Abdomen is soft.     Tenderness: There is no  abdominal tenderness.  Musculoskeletal:     Right lower leg: 4+ Edema present.     Left lower leg: 4+ Edema present.  Neurological:     Mental Status: He is alert.     Patient Lines/Drains/Airways Status     Active Line/Drains/Airways     Name Placement date Placement time Site Days   Peripheral IV 09/06/24 20 G Right Antecubital 09/06/24  1633  Antecubital  1   Airway 07/15/24  0825  -- 54            Pertinent labs and imaging:      Latest Ref Rng & Units 09/07/2024    4:15 AM 09/06/2024   11:37 PM 09/06/2024    1:23 PM  CBC  WBC 4.0 - 10.5 K/uL 12.9  15.5    Hemoglobin 13.0 - 17.0 g/dL 7.5  8.0  7.8   Hematocrit 39.0 - 52.0 % 25.4  27.1  23.0   Platelets 150 - 400 K/uL 299  323         Latest Ref Rng & Units 09/07/2024    4:15 AM 09/06/2024    1:23 PM 09/06/2024    1:16 PM  CMP  Glucose 70 - 99 mg/dL 881  896  898  BUN 8 - 23 mg/dL 12  14  13    Creatinine 0.61 - 1.24 mg/dL 9.05  8.89  8.99   Sodium 135 - 145 mmol/L 139  136  136   Potassium 3.5 - 5.1 mmol/L 3.9  3.9  3.9   Chloride 98 - 111 mmol/L 102  98  98   CO2 22 - 32 mmol/L 24   21   Calcium  8.9 - 10.3 mg/dL 8.9   9.0   Total Protein 6.5 - 8.1 g/dL   6.7   Total Bilirubin 0.0 - 1.2 mg/dL   0.7   Alkaline Phos 38 - 126 U/L   93   AST 15 - 41 U/L   27   ALT 0 - 44 U/L   25     DG Chest 2 View Result Date: 09/06/2024 CLINICAL DATA:  Shortness of breath. EXAM: CHEST - 2 VIEW COMPARISON:  Chest radiograph dated 08/03/2024. FINDINGS: Shallow inspiration. No focal consolidation, pleural effusion or pneumothorax. Stable cardiomegaly. No acute osseous pathology. IMPRESSION: 1. No active cardiopulmonary disease. 2. Cardiomegaly. Electronically Signed   By: Vanetta Chou M.D.   On: 09/06/2024 13:54    ASSESSMENT/PLAN:  Assessment: Principal Problem:   Anemia Active Problems:   Atrial fibrillation (HCC)   Shortness of breath   (HFpEF) heart failure with preserved ejection fraction (HCC)    Arthritis   Ryan [name] is a 68 y.o. person living with a history of PMH of HTN, TIA in 2019, fatty liver disease, obesity, dilated aortic root, arthritis, OSA not on CPAP, Obesity, and paroxysmal atrial fibrillation w/ 3 DCCVs this year who presented with shortness of breath and anemia and admitted for anemia on hospital day 0  # Anemia # Shortness of Breath Pt presented with low Hgb of 6.7 and was sent in from an outpatient provider for concern of low Hgb as well. Baseline Hgb ~14. Pt denies any recent history of blood loss, but reports likely bloody stools for a few days a couple of weeks ago. Has had worsening SOB over the last few months, markedly worse over the last few weeks. FOBT negative. OSA but does not use a CPAP. VBG nonconcerning. Could consider LE doppler vs PE study pending clinical course.  - Transfused 2 unit PRBC - Consulted GI, appreciate recs - Afternoon H&H, monitor frequently - IV Iron 500mg  once  # HFpEF Pt is fluid overloaded on exam and has been on Lasix  for managing his total fluid. Has put on ~30lbs in the last year which is suspected currently to be from fluid overload. Fluid overload is chronic, no clear JVD, no orthopnnea, so do not think ECHO is necessary at this time. BNP nonconcerning at 192. - IV Lasix  80mg  BID  - Daily weights - Strict I&O  # Atrial Fibrillation Pt has been controlled on Metoprolol  and Multaq . Recent history of 3 DCCVs this year. Has been on Eliquis  in the past, but this was discontinued for lightheadedness which was improved when he was switched to Xarelto . CHADSVASC: 4 - Metoprolol  Succinate 150mg  daily - Continue home Multaq  400 BID w/ meals PO - Holding home Xarelto  20mg  daily, pending GI consult  # Elevated TSH TSH 5.35 on 11/19 - T4 this afternoon w/ H&H  # Chronic Health Conditions Pt has the following chronic health conditions which are being managed with the following home medicaitons: - Tramadol 50mg  Q6H for Arthritic  pain - Lipitor 40mg  daily for HLD - Losartan  25mg  daily for HTN -  Pantoprazole  40mg  daily for reflux  Best practice: Diet: Normal VTE: SCDs Code: Full Code  Disposition planning: Prior to Admission Living Arrangement: Home, living with family Anticipated Discharge Location: Home  Best Practice: Diet: NPO VTE: SCDs Start: 09/06/24 1826 Code: Full  Disposition planning: DISPO: TBD, barriers include stable Hgb, GI recs, and improved SOB  Signature:  Penne Mori; DO - PGY1 [facility]  9:21 AM, 09/07/2024  On Call pager [phone]

## 2024-09-07 NOTE — ED Notes (Signed)
  at bedside

## 2024-09-07 NOTE — Consult Note (Addendum)
 Consultation  Referring Provider: Medicine service/hatcher Primary Care Physician:  Pia Kerney SQUIBB, MD Primary Gastroenterologist:  Dr. Alm Patterson/remote 2010  Reason for Consultation: Profound iron  deficiency anemia, symptomatic with shortness of breath and fatigue  HPI: Ryan [name] is a 68 y.o. male with history of atrial fibrillation status post previous cardioversions, currently on Xarelto , prior history of TIA, hypertension, hyperlipidemia, obesity, and diastolic dysfunction.  Also with history of sleep apnea/no CPAP. Patient says that he has had a rough year healthwise, had several infectious illnesses early in the year i.e. COVID, influenza etc.  He had been working on getting himself reconditioned and had been feeling better, had been going to the gym swimming and had gotten to the point where he was able to swim 40 laps.  He has had gradual onset of fatigue, weakness over the past 3 months or so.  Over the past couple of months had gotten to the point where he really was not able to exercise at all and had to pace himself just to get to the swimming pool.  Progressive shortness of breath now over the past several weeks as well.  Denies any chest pain. He had seen his PCP on Monday had labs done and was told to come to the ER as his hemoglobin was in the 6 range. He says he has been paying attention to his bowels ever since he was started on blood thinners.  He did have an episode a few weeks back where he had notably darker stools over couple of days.  He says he was paying attention but this subsided and his stools returned to normal so he did not think anything of it.  He has not noted any bright red blood per rectum.  He has no complaint of abdominal pain or discomfort, no changes in bowel habits, no heartburn or indigestion, no dysphagia. His most recent hemoglobin was 11.3 mid September 2025.  Labs in the ER 1118 showed WBC 13.4/hemoglobin 6.7/hematocrit 23.0/MCV of  71/platelets 346 Ferritin 4//serum iron  13/TIBC 522/iron  sat 3 B12 and folate within normal limits Stool Hemoccult negative BNP 192 INR 1.4 Sodium 136/potassium 3.9/BUN 13/creatinine 1.0 LFTs within normal limits  Patient relates that he has been known to have an elevated white count, he is being followed by hematology and that has been monitored at this point in time on no active therapy as counts have been stable. He did have prior colonoscopy in 2010 per Dr. Jakie with finding of diverticulosis, there was 1 large polyp in the rectosigmoid removed and then a couple of other polyps also removed.  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  He was indicated for 3-year interval follow-up.  He says he has not had colonoscopy since then but says that his PCP has been monitoring him and he has had Cologuard's done.  Last did a Cologuard 3 to 4 years ago which he says was negative.   Last dose of Xarelto  yesterday.  Past Medical History:  Diagnosis Date   Anxiety    Arthritis    knees (03/05/2018)   Cigarette smoker    Diastolic dysfunction    Diverticulosis of colon    DJD (degenerative joint disease)    Fatty liver disease, nonalcoholic    Hemorrhoids    Hx of colonic polyps    Hypercholesteremia    Hypertension    Obesity    Seborrheic dermatitis    Slurred speech  Stenosis of rectum and anus    TIA (transient ischemic attack) 03/05/2018   Venous insufficiency     Past Surgical History:  Procedure Laterality Date   CARDIOVERSION N/A 12/04/2023   Procedure: CARDIOVERSION;  Surgeon: Barbaraann Darryle Ned, MD;  Location: [facility] INVASIVE CV LAB;  Service: Cardiovascular;  Laterality: N/A;   CARDIOVERSION N/A 06/16/2024   Procedure: CARDIOVERSION;  Surgeon: Raford Riggs, MD;  Location: [facility] INVASIVE CV LAB;  Service: Cardiovascular;  Laterality: N/A;   CARDIOVERSION N/A 07/15/2024   Procedure: CARDIOVERSION;  Surgeon:  Kate Lonni CROME, MD;  Location: [facility] INVASIVE CV LAB;  Service: Cardiovascular;  Laterality: N/A;   COLONOSCOPY W/ POLYPECTOMY  06/27/2009   FLEXIBLE SIGMOIDOSCOPY     KNEE ARTHROSCOPY Right 11/2003   Dr. Heide   KNEE ARTHROSCOPY Left 05/24/2012   torn meniscus    TONSILLECTOMY      Prior to Admission medications   Medication Sig Start Date End Date Taking? Authorizing Provider  albuterol  (VENTOLIN  HFA) 108 (90 Base) MCG/ACT inhaler Inhale 1 puff into the lungs every 6 (six) hours as needed for shortness of breath. 09/05/24  Yes [provider]  atorvastatin  (LIPITOR) 40 MG tablet Take 1 tablet (40 mg total) by mouth daily. 02/23/18  Yes Christi Glendia HERO, MD  Cholecalciferol (VITAMIN D3) 125 MCG (5000 UT) TABS Take 5,000 Units by mouth daily.   Yes [provider]  Collagen-Vitamin C-Biotin (COLLAGEN PO) Take 3 capsules by mouth daily. 5000 units total   Yes [provider]  dronedarone  (MULTAQ ) 400 MG tablet Take 1 tablet (400 mg total) by mouth 2 (two) times daily with a meal. 07/29/24  Yes Terra Fairy PARAS, PA-C  furosemide  (LASIX ) 20 MG tablet Take 20 mg by mouth daily as needed for fluid or edema. 06/13/24  Yes [provider]  furosemide  (LASIX ) 40 MG tablet TAKE 1 TABLET DAILY 04/23/18  Yes Christi Glendia HERO, MD  losartan  (COZAAR ) 25 MG tablet TAKE 1 TABLET DAILY 08/19/24  Yes Terra Fairy PARAS, PA-C  MAGNESIUM  GLYCINATE PO Take 5,000 mg by mouth daily.   Yes [provider]  metoprolol  succinate (TOPROL -XL) 50 MG 24 hr tablet Take 3 tablets (150 mg total) by mouth daily. Take with or immediately following a meal. 07/29/24  Yes Terra Fairy PARAS, PA-C  Multiple Vitamins-Minerals (ONE-A-DAY MENS 50+) TABS Take 1 tablet by mouth daily. Silver 50+   Yes [provider]  omeprazole  (PRILOSEC ) 20 MG capsule Take 20 mg by mouth daily.   Yes [provider]  rivaroxaban  (XARELTO ) 20 MG TABS tablet Take 1 tablet (20 mg total) by mouth  daily with supper. 06/10/24  Yes Terra Fairy PARAS, PA-C  traMADol (ULTRAM) 50 MG tablet Take 50 mg by mouth 2 (two) times daily. 11/17/23  Yes [provider]    Current Facility-Administered Medications  Medication Dose Route Frequency Provider Last Rate Last Admin   0.9 %  sodium chloride  infusion (Manually program via Guardrails IV Fluids)   Intravenous Once Randol Simmonds, MD   Held at 09/06/24 2007   0.9 %  sodium chloride  infusion   Intravenous Continuous Esterwood, Amy S, PA-C       acetaminophen  (TYLENOL ) tablet 1,000 mg  1,000 mg Oral Q6H PRN Harrie Lonni, DO   1,000 mg at 09/06/24 2005   atorvastatin  (LIPITOR) tablet 40 mg  40 mg Oral Daily Juberg, Christopher, DO   40 mg at 09/07/24 0948   bisacodyl (DULCOLAX) EC tablet 10 mg  10 mg Oral  Once [employer], Amy S, PA-C       dronedarone  (MULTAQ ) tablet 400 mg  400 mg Oral BID WC Juberg, Christopher, DO   400 mg at 09/07/24 0948   furosemide  (LASIX ) injection 80 mg  80 mg Intravenous BID Nooruddin, Saad, MD   80 mg at 09/07/24 0950   iron sucrose (VENOFER) 500 mg in sodium chloride  0.9 % 250 mL IVPB  500 mg Intravenous Once Hatcher, Jeffrey C, MD [phone] mL/hr at 09/07/24 0957 500 mg at 09/07/24 0957   losartan  (COZAAR ) tablet 25 mg  25 mg Oral Daily Juberg, Christopher, DO   25 mg at 09/07/24 9052   metoprolol  succinate (TOPROL -XL) 24 hr tablet 150 mg  150 mg Oral Daily Juberg, Christopher, DO   150 mg at 09/07/24 0947   Na Sulfate-K Sulfate-Mg Sulfate concentrate (SUPREP) kit 177 mL  0.5 kit Oral Once Esterwood, Amy S, PA-C       Followed by   Na Sulfate-K Sulfate-Mg Sulfate concentrate (SUPREP) kit 177 mL  0.5 kit Oral Once Esterwood, Amy S, PA-C       pantoprazole  (PROTONIX ) EC tablet 40 mg  40 mg Oral Daily Juberg, Christopher, DO   40 mg at 09/07/24 0947   simethicone (MYLICON) chewable tablet 240 mg  240 mg Oral Once Esterwood, Amy S, PA-C       Followed by   simethicone (MYLICON) chewable tablet 240 mg  240 mg  Oral Once Esterwood, Amy S, PA-C       traMADol (ULTRAM) tablet 50 mg  50 mg Oral Q6H PRN Harrie Bruckner, DO   50 mg at 09/07/24 0423   Current Outpatient Medications  Medication Sig Dispense Refill   albuterol  (VENTOLIN  HFA) 108 (90 Base) MCG/ACT inhaler Inhale 1 puff into the lungs every 6 (six) hours as needed for shortness of breath.     atorvastatin  (LIPITOR) 40 MG tablet Take 1 tablet (40 mg total) by mouth daily. 90 tablet 4   Cholecalciferol (VITAMIN D3) 125 MCG (5000 UT) TABS Take 5,000 Units by mouth daily.     Collagen-Vitamin C-Biotin (COLLAGEN PO) Take 3 capsules by mouth daily. 5000 units total     dronedarone  (MULTAQ ) 400 MG tablet Take 1 tablet (400 mg total) by mouth 2 (two) times daily with a meal. 6 tablet    furosemide  (LASIX ) 20 MG tablet Take 20 mg by mouth daily as needed for fluid or edema.     furosemide  (LASIX ) 40 MG tablet TAKE 1 TABLET DAILY 90 tablet 1   losartan  (COZAAR ) 25 MG tablet TAKE 1 TABLET DAILY 90 tablet 3   MAGNESIUM  GLYCINATE PO Take 5,000 mg by mouth daily.     metoprolol  succinate (TOPROL -XL) 50 MG 24 hr tablet Take 3 tablets (150 mg total) by mouth daily. Take with or immediately following a meal. 270 tablet 3   Multiple Vitamins-Minerals (ONE-A-DAY MENS 50+) TABS Take 1 tablet by mouth daily. Silver 50+     omeprazole  (PRILOSEC ) 20 MG capsule Take 20 mg by mouth daily.     rivaroxaban  (XARELTO ) 20 MG TABS tablet Take 1 tablet (20 mg total) by mouth daily with supper. 90 tablet 2   traMADol (ULTRAM) 50 MG tablet Take 50 mg by mouth 2 (two) times daily.      Allergies as of 09/06/2024   (No Known Allergies)    Family History  Problem Relation Age of Onset   Hypertension Father    Hypertension Mother     Social History  Socioeconomic History   Marital status: Widowed    Spouse name: Not on file   Number of children: 2   Years of education: Not on file   Highest education level: Not on file  Occupational History   Not on file   Tobacco Use   Smoking status: Former    Current packs/day: 0.00    Average packs/day: 1 pack/day for 27.0 years (27.0 ttl pk-yrs)    Types: Cigarettes    Start date: 10/20/1982    Quit date: 10/20/2009    Years since quitting: 14.8   Smokeless tobacco: Never   Tobacco comments:    Smoked for about 40 years - about 1ppd  Vaping Use   Vaping status: Never Used  Substance and Sexual Activity   Alcohol use: Yes    Alcohol/week: 8.0 standard drinks of alcohol    Types: 8 Cans of beer per week   Drug use: Never   Sexual activity: Not Currently  Other Topics Concern   Not on file  Social History Narrative   Not on file   Social Drivers of Health   Financial Resource Strain: Not on file  Food Insecurity: No Food Insecurity (11/03/2023)   Hunger Vital Sign    Worried About Running Out of Food in the Last Year: Never true    Ran Out of Food in the Last Year: Never true  Transportation Needs: No Transportation Needs (11/03/2023)   PRAPARE - Administrator, Civil Service (Medical): No    Lack of Transportation (Non-Medical): No  Physical Activity: Not on file  Stress: Not on file  Social Connections: Socially Isolated (10/20/2023)   Social Connection and Isolation Panel    Frequency of Communication with Friends and Family: More than three times a week    Frequency of Social Gatherings with Friends and Family: Once a week    Attends Religious Services: Never    Database Administrator or Organizations: No    Attends Banker Meetings: Never    Marital Status: Widowed  Intimate Partner Violence: Not At Risk (11/03/2023)   Humiliation, Afraid, Rape, and Kick questionnaire    Fear of Current or Ex-Partner: No    Emotionally Abused: No    Physically Abused: No    Sexually Abused: No    Review of Systems: Pertinent positive and negative review of systems were noted in the above HPI section.  All other review of systems was otherwise negative.   Physical  Exam: Vital signs in last 24 hours: Temp:  [97.8 F (36.6 C)-98.6 F (37 C)] 98.6 F (37 C) (11/19 0804) Pulse Rate:  [80-98] 92 (11/19 1000) Resp:  [12-19] 16 (11/19 1000) BP: (123-150)/(68-97) 150/92 (11/19 1000) SpO2:  [90 %-100 %] 100 % (11/19 1000)   General:   Alert,  Well-developed, well-nourished, obese older white male pleasant and cooperative in NAD, wife at bedside Head:  Normocephalic and atraumatic. Eyes:  Sclera clear, no icterus.   Conjunctiva pale Ears:  Normal auditory acuity. Nose:  No deformity, discharge,  or lesions. Mouth:  No deformity or lesions.   Neck:  Supple; no masses or thyromegaly. Lungs:  Clear throughout to auscultation.   No wheezes, crackles, or rhonchi.  Heart:  Regular rate and rhythm; no murmurs, clicks, rubs,  or gallops. Abdomen:  Soft, obese, nontender, no palpable mass or hepatosplenomegaly, bowel sounds are present Rectal: Documented heme-negative on admission Msk:  Symmetrical without gross deformities. . Pulses:  Normal pulses noted. Extremities:  Without clubbing or edema. Neurologic:  Alert and  oriented x4;  grossly normal neurologically. Skin:  Intact without significant lesions or rashes.. Psych:  Alert and cooperative. Normal mood and affect.  Intake/Output from previous day: 11/18 0701 - 11/19 0700 In: 2.7 [I.V.:2.7] Out: -  Intake/Output this shift: No intake/output data recorded.  Lab Results: Recent Labs    09/06/24 1316 09/06/24 1323 09/06/24 2337 09/07/24 0415  WBC 13.4*  --  15.5* 12.9*  HGB 6.7* 7.8* 8.0* 7.5*  HCT 23.0* 23.0* 27.1* 25.4*  PLT 346  --  323 299   BMET Recent Labs    09/06/24 1316 09/06/24 1323 09/07/24 0415  NA 136 136 139  K 3.9 3.9 3.9  CL 98 98 102  CO2 21*  --  24  GLUCOSE 101* 103* 118*  BUN 13 14 12   CREATININE 1.00 1.10 0.94  CALCIUM  9.0  --  8.9   LFT Recent Labs    09/06/24 1316  PROT 6.7  ALBUMIN 3.5  AST 27  ALT 25  ALKPHOS 93  BILITOT 0.7   PT/INR Recent  Labs    09/06/24 1316  LABPROT 18.2*  INR 1.4*   Hepatitis Panel No results for input(s): HEPBSAG, HCVAB, HEPAIGM, HEPBIGM in the last 72 hours.    IMPRESSION:  #50 68 year old white male presenting with profound microcytic/iron deficient anemia, symptomatic with progressive shortness of breath over the past several weeks, fatigue, exertional significant dyspnea. Outpatient labs per PCP earlier this week with finding of hemoglobin in the 6 range down from hemoglobin of 11.5 in mid September 2025. Patient on chronic Xarelto   Patient did have an episode of dark stool within the past month that lasted for 2 to 3 days by his report and then resolved and stools have looked normal since that time.  He also has history of a large tubulovillous adenomatous polyp removed from colonoscopy in 2010 as well as other adenomatous polyps.  No follow-up colonoscopy though recommended to have follow-up in 3 years. Cologuard -3 to 4 years ago  Suspect patient did have some active GI bleeding with the episode of dark stools several weeks ago and is probably had lower grade bleeding ongoing over the past couple of months. Etiology of blood loss not clear though concerned about possibility of colonic neoplasm as source, also consider AVMs, chronic gastropathy versus other.  #2 chronic anticoagulation-on Xarelto  last dose yesterday #3 atrial fibrillation status post cardioversions x 3 currently normal sinus rhythm #4 hyperlipidemia #5.  Morbid obesity #6.  Hypertension #7.  Sleep apnea no CPAP use #8 chronic leukocytosis-hematology monitoring  Plan; clear liquid diet today n.p.o. after midnight Discussed colonoscopy and EGD with the patient in detail including indications risks and benefits and he is agreeable to proceed.  Procedures will be done by Dr. San, tomorrow 09/08/2024. Plan bowel prep this evening Hold Xarelto  Patient has been transfused 2 units of packed RBCs and was receiving  IV iron currently Monitor hemoglobin GI will follow with you   Amy EsterwoodPA-C  09/07/2024, 11:50 AM    Attending physician's note   I have taken a history, reviewed the chart, and examined the patient. I performed a substantive portion of this encounter, including complete performance of at least one of the key components, in conjunction with the APP. I agree with the APP's note, impression, and recommendations with my edits.  68 year old male with medical history as outlined above, to include history of A-fib (on Xarelto ), prior TIA, HTN, obesity, along with  history of TVA in 2010, presents with symptomatic iron  deficiency anemia.  Has been having progressive SOB and DOE with decreasing exercise tolerance.  Did have dark stools a few weeks ago lasting 2-3 days, but none since then.  Otherwise no GI symptoms.  Last colonoscopy was 2010 and notable for a tubulovillous adenoma.  No colonoscopy since then.  Has been using Cologuard's which he reports had been negative.  Baseline Hgb appears to be 14-15 with MCV 88.  Reviewed most recent outpatient labs from  September which show downtrend at 11.3/36.5 with MCV/RDW 85/15.  BMP was normal.  On admission was noted to have H/H 6.7/23 with MCV/RDW 71/17, ferritin 4, iron  13, TIBC 522, sat 3%.  No prior iron  studies for comparison.  Normal B12 and folate.  Transfused 2 units RBCs and IV iron  now running.  1) Iron  deficiency anemia 2) Symptomatic anemia 3) History of tubulovillous adenoma - Posttransfusion CBC check with additional blood products as needed per protocol - IV iron  infusing now - CLD today - Bowel prep this evening with plan for EGD and colonoscopy tomorrow for diagnostic and therapeutic intent - Holding Xarelto   4) A-fib 5) Chronic anticoagulation - As above, holding Xarelto  in light of suspected GI bleed - Medical management of A-fib per admitting Medicine service  6) Obesity Morbid obesity with thick neck and short  thyromental distance.  Will need MAC assistance for sedation for endoscopic procedures  The indications, risks, and benefits of EGD and colonoscopy were explained to the patient in detail. Risks include but are not limited to bleeding, perforation, adverse reaction to medications, and cardiopulmonary compromise. Sequelae include but are not limited to the possibility of surgery, hospitalization, and mortality. The patient verbalized understanding and wished to proceed.   [address] DO, FACG [phone] office

## 2024-09-08 ENCOUNTER — Encounter (HOSPITAL_COMMUNITY): Admission: EM | Disposition: A | Payer: Self-pay | Source: Home / Self Care | Attending: Infectious Diseases

## 2024-09-08 ENCOUNTER — Inpatient Hospital Stay (HOSPITAL_COMMUNITY): Admitting: Anesthesiology

## 2024-09-08 ENCOUNTER — Encounter (HOSPITAL_COMMUNITY): Payer: Self-pay | Admitting: Infectious Diseases

## 2024-09-08 DIAGNOSIS — D125 Benign neoplasm of sigmoid colon: Secondary | ICD-10-CM | POA: Diagnosis not present

## 2024-09-08 DIAGNOSIS — K6389 Other specified diseases of intestine: Secondary | ICD-10-CM

## 2024-09-08 DIAGNOSIS — K2270 Barrett's esophagus without dysplasia: Secondary | ICD-10-CM | POA: Diagnosis not present

## 2024-09-08 DIAGNOSIS — K5730 Diverticulosis of large intestine without perforation or abscess without bleeding: Secondary | ICD-10-CM

## 2024-09-08 DIAGNOSIS — Z87891 Personal history of nicotine dependence: Secondary | ICD-10-CM

## 2024-09-08 DIAGNOSIS — I4811 Longstanding persistent atrial fibrillation: Secondary | ICD-10-CM | POA: Diagnosis not present

## 2024-09-08 DIAGNOSIS — D509 Iron deficiency anemia, unspecified: Secondary | ICD-10-CM | POA: Diagnosis not present

## 2024-09-08 DIAGNOSIS — D120 Benign neoplasm of cecum: Secondary | ICD-10-CM

## 2024-09-08 DIAGNOSIS — D128 Benign neoplasm of rectum: Secondary | ICD-10-CM

## 2024-09-08 DIAGNOSIS — I10 Essential (primary) hypertension: Secondary | ICD-10-CM

## 2024-09-08 DIAGNOSIS — K317 Polyp of stomach and duodenum: Secondary | ICD-10-CM | POA: Diagnosis not present

## 2024-09-08 DIAGNOSIS — K641 Second degree hemorrhoids: Secondary | ICD-10-CM

## 2024-09-08 DIAGNOSIS — K635 Polyp of colon: Secondary | ICD-10-CM

## 2024-09-08 DIAGNOSIS — D123 Benign neoplasm of transverse colon: Secondary | ICD-10-CM | POA: Diagnosis not present

## 2024-09-08 DIAGNOSIS — D131 Benign neoplasm of stomach: Secondary | ICD-10-CM

## 2024-09-08 DIAGNOSIS — D124 Benign neoplasm of descending colon: Secondary | ICD-10-CM | POA: Diagnosis not present

## 2024-09-08 DIAGNOSIS — D649 Anemia, unspecified: Secondary | ICD-10-CM | POA: Diagnosis not present

## 2024-09-08 DIAGNOSIS — I5032 Chronic diastolic (congestive) heart failure: Secondary | ICD-10-CM | POA: Diagnosis not present

## 2024-09-08 DIAGNOSIS — Z7901 Long term (current) use of anticoagulants: Secondary | ICD-10-CM | POA: Diagnosis not present

## 2024-09-08 HISTORY — PX: POLYPECTOMY: SHX149

## 2024-09-08 HISTORY — PX: ESOPHAGOGASTRODUODENOSCOPY: SHX5428

## 2024-09-08 HISTORY — PX: COLONOSCOPY: SHX5424

## 2024-09-08 LAB — CBC
HCT: 27.3 % — ABNORMAL LOW (ref 39.0–52.0)
Hemoglobin: 8.2 g/dL — ABNORMAL LOW (ref 13.0–17.0)
MCH: 21.6 pg — ABNORMAL LOW (ref 26.0–34.0)
MCHC: 30 g/dL (ref 30.0–36.0)
MCV: 71.8 fL — ABNORMAL LOW (ref 80.0–100.0)
Platelets: 316 K/uL (ref 150–400)
RBC: 3.8 MIL/uL — ABNORMAL LOW (ref 4.22–5.81)
RDW: 19 % — ABNORMAL HIGH (ref 11.5–15.5)
WBC: 15.9 K/uL — ABNORMAL HIGH (ref 4.0–10.5)
nRBC: 0.4 % — ABNORMAL HIGH (ref 0.0–0.2)

## 2024-09-08 LAB — BASIC METABOLIC PANEL WITH GFR
Anion gap: 12 (ref 5–15)
BUN: 10 mg/dL (ref 8–23)
CO2: 28 mmol/L (ref 22–32)
Calcium: 8.7 mg/dL — ABNORMAL LOW (ref 8.9–10.3)
Chloride: 93 mmol/L — ABNORMAL LOW (ref 98–111)
Creatinine, Ser: 1.15 mg/dL (ref 0.61–1.24)
GFR, Estimated: >60 mL/min (ref >60)
Glucose, Bld: 104 mg/dL — ABNORMAL HIGH (ref 70–99)
Potassium: 3.1 mmol/L — ABNORMAL LOW (ref 3.5–5.1)
Sodium: 133 mmol/L — ABNORMAL LOW (ref 135–145)

## 2024-09-08 LAB — MAGNESIUM: Magnesium: 1.7 mg/dL (ref 1.7–2.4)

## 2024-09-08 SURGERY — COLONOSCOPY
Anesthesia: Monitor Anesthesia Care

## 2024-09-08 MED ORDER — POTASSIUM CHLORIDE CRYS ER 20 MEQ PO TBCR: 40.0000 meq | EXTENDED_RELEASE_TABLET | Freq: Once | ORAL | Status: DC

## 2024-09-08 MED ORDER — FERROUS SULFATE 325 (65 FE) MG PO TABS
325.0000 mg | ORAL_TABLET | Freq: Every day | ORAL | Status: DC
  Administered 2024-09-08 – 2024-09-09 (×2): 325 mg via ORAL
  Filled 2024-09-08 (×2): qty 1

## 2024-09-08 MED ORDER — PROPOFOL 10 MG/ML IV BOLUS
INTRAVENOUS | Status: DC | PRN
  Administered 2024-09-08: 80 mg via INTRAVENOUS

## 2024-09-08 MED ORDER — FUROSEMIDE 10 MG/ML IJ SOLN
120.0000 mg | Freq: Two times a day (BID) | INTRAVENOUS | Status: DC
  Administered 2024-09-08 – 2024-09-09 (×2): 120 mg via INTRAVENOUS
  Filled 2024-09-08: qty 120
  Filled 2024-09-08: qty 12
  Filled 2024-09-08: qty 10

## 2024-09-08 MED ORDER — MAGNESIUM SULFATE 2 GM/50ML IV SOLN
2.0000 g | Freq: Once | INTRAVENOUS | Status: AC
  Administered 2024-09-08: 2 g via INTRAVENOUS
  Filled 2024-09-08: qty 50

## 2024-09-08 MED ORDER — PHENYLEPHRINE 80 MCG/ML (10ML) SYRINGE FOR IV PUSH (FOR BLOOD PRESSURE SUPPORT)
PREFILLED_SYRINGE | INTRAVENOUS | Status: DC | PRN
  Administered 2024-09-08 (×2): 160 ug via INTRAVENOUS
  Administered 2024-09-08: 80 ug via INTRAVENOUS
  Administered 2024-09-08: 160 ug via INTRAVENOUS

## 2024-09-08 MED ORDER — LIDOCAINE 2% (20 MG/ML) 5 ML SYRINGE
INTRAMUSCULAR | Status: DC | PRN
  Administered 2024-09-08: 100 mg via INTRAVENOUS

## 2024-09-08 MED ORDER — SODIUM CHLORIDE 0.9 % IV SOLN
INTRAVENOUS | Status: AC | PRN
  Administered 2024-09-08: 20 mL via INTRAVENOUS

## 2024-09-08 MED ORDER — PROPOFOL 500 MG/50ML IV EMUL
INTRAVENOUS | Status: DC | PRN
  Administered 2024-09-08: 125 ug/kg/min via INTRAVENOUS

## 2024-09-08 MED ORDER — POTASSIUM CHLORIDE CRYS ER 20 MEQ PO TBCR
40.0000 meq | EXTENDED_RELEASE_TABLET | Freq: Once | ORAL | Status: AC
  Administered 2024-09-08: 40 meq via ORAL
  Filled 2024-09-08: qty 2

## 2024-09-08 NOTE — TOC CM/SW Note (Signed)
 Transition of Care [facility]) - Inpatient Brief Assessment   Patient Details  Name: Ryan [name] MRN: [id number] Date of Birth: [birth year]  [facility] of [facility]) CM/SW Contact:    Tom-Johnson, Harvest Muskrat, RN Phone Number: 09/08/2024, 4:29 PM   Clinical Narrative:  Patient sent to the ED from his PCP's office for abnormal Hgb and worsening Shortness of Breath. Found to have hgb of 6.7 on admit, received 2U PRBC, today hgb at 8.2. Has hx of TIA, A-Fib on Xarelto  with Cardioversion, HTN, Obesity with weight loss Dilated Aortic Root, Arthritis, OSA. GI consulted, underwent EGD and Colonoscopy today 09/08/24.   CM spoke with patient and significant other, [name] at bedside about needs for post hospital transition. Patient lives with [name], has two supportive children. Retired, modified independent. Has a cane, walker and w/c at home. Requesting a manual wheelchair as the one he has he bought himself and is to large. MD to place order.  PCP is Pia Kerney SQUIBB, MD and uses [facility] [address] and also [facility].  Patient not Medically ready for discharge.  CM will continue to follow as patient progresses with care towards discharge.          Transition of Care Asessment: Insurance and Status: Insurance coverage has been reviewed Patient has primary care physician: Yes Home environment has been reviewed: Yes Prior level of function:: Modified Independent Prior/Current Home Services: No current home services Social Drivers of Health Review: SDOH reviewed no interventions necessary Readmission risk has been reviewed: Yes Transition of care needs: no transition of care needs at this time

## 2024-09-08 NOTE — Transfer of Care (Signed)
 Immediate Anesthesia Transfer of Care Note  Patient: Ryan [name]  Procedure(s) Performed: COLONOSCOPY EGD (ESOPHAGOGASTRODUODENOSCOPY) POLYPECTOMY, INTESTINE  Patient Location: PACU  Anesthesia Type:MAC  Level of Consciousness: awake and patient cooperative  Airway & Oxygen Therapy: Patient Spontanous Breathing and Patient connected to nasal cannula oxygen  Post-op Assessment: Report given to RN and Post -op Vital signs reviewed and stable  Post vital signs: Reviewed and stable  Last Vitals:  Vitals Value Taken Time  BP    Temp    Pulse 77 09/08/24 09:51  Resp 17 09/08/24 09:51  SpO2 96 % 09/08/24 09:51  Vitals shown include unfiled device data.  Last Pain:  Vitals:   09/08/24 0826  TempSrc: Temporal  PainSc: 0-No pain         Complications: No notable events documented.

## 2024-09-08 NOTE — Interval H&P Note (Signed)
 History and Physical Interval Note:  No acute events overnight.  H/H 8.2/27 today after 2 unit RBC transfusion and IV iron in the ER.  No overt bleeding.  Completed bowel prep without issue and no blood with prep.  Plan to proceed with EGD and colonoscopy today for diagnostic and therapeutic intent.  09/08/2024 8:32 AM  Ryan [name]  has presented today for surgery, with the diagnosis of Profound iron deficiency anemia, heme positive stool.  The various methods of treatment have been discussed with the patient and family. After consideration of risks, benefits and other options for treatment, the patient has consented to  Procedure(s): COLONOSCOPY (N/A) EGD (ESOPHAGOGASTRODUODENOSCOPY) (N/A) as a surgical intervention.  The patient's history has been reviewed, patient examined, no change in status, stable for surgery.  I have reviewed the patient's chart and labs.  Questions were answered to the patient's satisfaction.     Sandor GAILS Aleayah [place]

## 2024-09-08 NOTE — Op Note (Signed)
 [facility] Patient Name: Ryan [name] Procedure Date : 09/08/2024 MRN: [id number] Attending MD: Sandor Flatter , MD, [id number] Date of Birth: December 24, 1955 CSN: [id number] Age: 68 Admit Type: Inpatient Procedure:                Upper GI endoscopy Indications:              Iron deficiency anemia Providers:                Sandor Flatter, MD, Willy Hummer, RN, Farris Southgate, Technician Referring MD:              Medicines:                Monitored Anesthesia Care Complications:            No immediate complications. Estimated Blood Loss:     Estimated blood loss was minimal. Procedure:                Pre-Anesthesia Assessment:                           - Prior to the procedure, a History and Physical                            was performed, and patient medications and                            allergies were reviewed. The patient's tolerance of                            previous anesthesia was also reviewed. The risks                            and benefits of the procedure and the sedation                            options and risks were discussed with the patient.                            All questions were answered, and informed consent                            was obtained. Prior Anticoagulants: The patient has                            taken Xarelto  (rivaroxaban ), last dose was 2 days                            prior to procedure. ASA Grade Assessment: III - A                            patient with severe systemic disease. After  reviewing the risks and benefits, the patient was                            deemed in satisfactory condition to undergo the                            procedure.                           After obtaining informed consent, the endoscope was                            passed under direct vision. Throughout the                            procedure, the patient's blood pressure,  pulse, and                            oxygen saturations were monitored continuously. The                            [id number]-[id number] ([id number]) Olympus endoscope was introduced                            through the mouth, and advanced to the second part                            of duodenum. The upper GI endoscopy was                            accomplished without difficulty. The patient                            tolerated the procedure well. Scope In: Scope Out: Findings:      One tongue of salmon-colored mucosa was present from 44 to 47 cm. No       other visible abnormalities were present. The maximum longitudinal       extent of these esophageal mucosal changes was 3 cm in length. Biopsies       were taken with a cold forceps for histology. Estimated blood loss was       minimal.      Multiple small sessile polyps with no bleeding and no stigmata of recent       bleeding were found in the gastric fundus and in the gastric body. These       polyps were removed with a cold biopsy forceps. Resection and retrieval       were complete. Estimated blood loss was minimal.      The mucosa was otherwise normal appearing in the gastric fundus, in the       gastric body, at the incisura and in the gastric antrum. Biopsies were       taken with a cold forceps for Helicobacter pylori testing. Estimated       blood loss was minimal.      The examined duodenum was normal. Biopsies were taken with a cold       forceps  for histology. Estimated blood loss was minimal. Impression:               - Single 3 cm tongue of salmon-colored mucosa.                            Biopsied.                           - Multiple gastric polyps. Resected and retrieved.                           - Normal mucosa was found in the gastric fundus, in                            the gastric body, in the incisura and in the                            antrum. Biopsied.                           - Normal examined duodenum.  Biopsied. Recommendation:           - Await pathology results.                           - Perform a colonoscopy today.                           - See colonoscopy report for additional details and                            recommendations regarding ongoing inpatient                            management. Procedure Code(s):        --- Professional ---                           [phone], Esophagogastroduodenoscopy, flexible,                            transoral; with biopsy, single or multiple Diagnosis Code(s):        --- Professional ---                           K22.89, Other specified disease of esophagus                           K31.7, Polyp of stomach and duodenum                           D50.9, Iron deficiency anemia, unspecified CPT copyright 2022 American [employer] Association. All rights reserved. The codes documented in this report are preliminary and upon coder review may  be revised to meet current compliance requirements. Sandor Flatter, MD 09/08/2024 10:04:00 AM Number of Addenda: 0

## 2024-09-08 NOTE — Progress Notes (Signed)
 HD#1 SUBJECTIVE:  Patient Summary: Ryan [name] is a 68 y.o. with a pertinent PMH of  HTN, TIA in 2019, fatty liver disease, obesity, dilated aortic root, arthritis, OSA not on CPAP, Obesity, and paroxysmal atrial fibrillation w/ 3 DCCVs this year, who presented with low Hgb and worsening SOB and admitted for Symptomatic Anemia.   Interim History:  11/20 There were no acute events overnight. Vital signs have been stable. Hgb is 8.2 this morning. K 3.1 and Mag 1.7 (both repleted). GI performed the endoscopy and colonoscopy this morning, which did not find a source of bleeding (see their report for full findings).  On interview prior to endoscopy, pt reports that his mouth has been dry as he has been keeping to the NPO status, but that he otherwise feels fine. He preferred the longer walk to the bathroom when he was in the ED because he likes to exercise more. Has not experienced any lightheadedness. Still experiences some SOB with ambulation.   OBJECTIVE:  Vital Signs: Vitals:   09/08/24 0951 09/08/24 1000 09/08/24 1010 09/08/24 1020  BP: 107/68 119/79    Pulse: 77 79 84 83  Resp: 17 17 14 19   Temp: 97.9 F (36.6 C)     TempSrc: Temporal     SpO2: 96% 98% 100% 94%  Weight:      Height:       Supplemental O2: Room Air SpO2: 94 %  Filed Weights   09/07/24 1639 09/08/24 0702  Weight: (!) 184.3 kg (!) 184 kg    Intake/Output Summary (Last 24 hours) at 09/08/2024 1032 Last data filed at 09/08/2024 0927 Gross per 24 hour  Intake 1085 ml  Output 1150 ml  Net -65 ml   Net IO Since Admission: -62.33 mL [09/08/24 1032]  Physical Exam: Physical Exam Vitals and nursing note reviewed. Exam conducted with a chaperone present.  Constitutional:      General: He is not in acute distress.    Appearance: He is obese. He is not toxic-appearing.  Cardiovascular:     Rate and Rhythm: Normal rate and regular rhythm.  Pulmonary:     Effort: Pulmonary effort is normal. No respiratory  distress.  Abdominal:     General: Bowel sounds are normal. There is no distension.     Palpations: Abdomen is soft.     Tenderness: There is no abdominal tenderness.  Musculoskeletal:     Right lower leg: 1+ Edema present.     Left lower leg: 1+ Edema present.  Neurological:     Mental Status: He is alert.     Patient Lines/Drains/Airways Status     Active Line/Drains/Airways     Name Placement date Placement time Site Days   Peripheral IV 09/06/24 20 G Right Antecubital 09/06/24  1633  Antecubital  1   Airway 07/15/24  0825  -- 54            Pertinent labs and imaging:      Latest Ref Rng & Units 09/08/2024    3:21 AM 09/07/2024    1:17 PM 09/07/2024    4:15 AM  CBC  WBC 4.0 - 10.5 K/uL 15.9   12.9   Hemoglobin 13.0 - 17.0 g/dL 8.2  8.7  7.5   Hematocrit 39.0 - 52.0 % 27.3  29.6  25.4   Platelets 150 - 400 K/uL 316   299        Latest Ref Rng & Units 09/08/2024    3:21 AM 09/07/2024  4:15 AM 09/06/2024    1:23 PM  CMP  Glucose 70 - 99 mg/dL 895  881  896   BUN 8 - 23 mg/dL 10  12  14    Creatinine 0.61 - 1.24 mg/dL 8.84  9.05  8.89   Sodium 135 - 145 mmol/L 133  139  136   Potassium 3.5 - 5.1 mmol/L 3.1  3.9  3.9   Chloride 98 - 111 mmol/L 93  102  98   CO2 22 - 32 mmol/L 28  24    Calcium  8.9 - 10.3 mg/dL 8.7  8.9      No results found.   ASSESSMENT/PLAN:  Assessment: Principal Problem:   Acute anemia Active Problems:   Atrial fibrillation (HCC)   Shortness of breath   (HFpEF) heart failure with preserved ejection fraction (HCC)   Arthritis   Iron deficiency anemia   Symptomatic anemia   Anemia   Grade II internal hemorrhoids   Polyp of cecum   Adenomatous polyp of transverse colon   Adenomatous polyp of sigmoid colon   Diverticulosis of colon without hemorrhage   Fundic gland polyps of stomach, benign  Ryan [name] is a 68 y.o. person living with a history of PMH of HTN, TIA in 2019, fatty liver disease, obesity, dilated aortic  root, arthritis, OSA not on CPAP, Obesity, and paroxysmal atrial fibrillation w/ 3 DCCVs this year who presented with shortness of breath and anemia and admitted for anemia on hospital day 0  # Anemia # Shortness of Breath Pt presented with low Hgb of 6.7 and was sent in from an outpatient provider for concern of low Hgb as well. Baseline Hgb ~14. Pt denies any recent history of blood loss, but reports likely bloody stools for a few days a couple of weeks ago. Has had worsening SOB over the last few months, markedly worse over the last few weeks. FOBT negative. OSA but does not use a CPAP. VBG nonconcerning. GI EGD and Colonoscopy did not reveal any bleed, but did reveal Barrett's esophagus, and benign gastric polyps/11 colonic polyps, all removed. Recommend its ok to resume [facility] tomorrow if no bleeding overnight and anticipate DC tomorrow. - Transfused 2 unit PRBC - GI workup negative for bleed - IV Iron 500mg  once, given - PO Iron 325mg  daily  # HFpEF Pt is fluid overloaded on exam and has been on Lasix  for managing his total fluid. Has put on ~30lbs in the last year which is suspected currently to be from fluid overload. Fluid overload is chronic, no clear JVD, no orthopnnea, so do not think ECHO is necessary at this time. BNP nonconcerning at 192. - IV Lasix  80mg  BID  - Daily weights - Strict I&O  # Atrial Fibrillation Pt has been controlled on Metoprolol  and Multaq . Recent history of 3 DCCVs this year. Has been on Eliquis  in the past, but this was discontinued for lightheadedness which was improved when he was switched to Xarelto . CHADSVASC: 4 - Metoprolol  Succinate 150mg  daily - Continue home Multaq  400 BID w/ meals PO - Holding home Xarelto  20mg  daily, can resume 11/21 if no bleeding overnight.  # Elevated TSH TSH 5.35 on 11/19, T4 0.79 - No further concern  # Chronic Health Conditions Pt has the following chronic health conditions which are being managed with the following home  medicaitons: - Tramadol 50mg  Q6H for Arthritic pain - Lipitor 40mg  daily for HLD - Losartan  25mg  daily for HTN - Pantoprazole  40mg  daily for reflux  Best practice: Diet: Normal VTE: SCDs, restart Xarelto  11/21 Code: Full Code  Disposition planning: Prior to Admission Living Arrangement: Home, living with family Anticipated Discharge Location: Home  Best Practice: Diet: NPO VTE: SCDs Start: 09/06/24 1826 Code: Full  Disposition planning: DISPO: Anticipate discharge to home on 11/21.  GI planning on follow up in 7-10 days and further follow up in 2 months  Signature:  Penne Mori; DO - PGY1 [facility]  10:32 AM, 09/08/2024  On Call pager [phone]

## 2024-09-08 NOTE — Op Note (Signed)
 [facility] Patient Name: Ryan [name] Procedure Date : 09/08/2024 MRN: [id number] Attending MD: Sandor Flatter , MD, [id number] Date of Birth: 05/18/1956 CSN: [id number] Age: 68 Admit Type: Inpatient Procedure:                Colonoscopy Indications:              Iron deficiency anemia Providers:                Sandor Flatter, MD, Willy Hummer, RN, Farris Southgate, Technician Referring MD:              Medicines:                Monitored Anesthesia Care Complications:            No immediate complications. Estimated Blood Loss:     Estimated blood loss was minimal. Procedure:                Pre-Anesthesia Assessment:                           - Prior to the procedure, a History and Physical                            was performed, and patient medications and                            allergies were reviewed. The patient's tolerance of                            previous anesthesia was also reviewed. The risks                            and benefits of the procedure and the sedation                            options and risks were discussed with the patient.                            All questions were answered, and informed consent                            was obtained. Prior Anticoagulants: The patient has                            taken Xarelto  (rivaroxaban ), last dose was 2 days                            prior to procedure. ASA Grade Assessment: III - A                            patient with severe systemic disease. After  reviewing the risks and benefits, the patient was                            deemed in satisfactory condition to undergo the                            procedure.                           After obtaining informed consent, the colonoscope                            was passed under direct vision. Throughout the                            procedure, the patient's blood pressure, pulse,  and                            oxygen saturations were monitored continuously. The                            CF-HQ190L ([id number]) Olympus colonoscopy was                            introduced through the anus and advanced to the the                            terminal ileum. The colonoscopy was performed                            without difficulty. The patient tolerated the                            procedure well. The quality of the bowel                            preparation was good. The terminal ileum, ileocecal                            valve, appendiceal orifice, and rectum were                            photographed. Scope In: 9:06:30 AM Scope Out: 9:40:46 AM Scope Withdrawal Time: 0 hours 31 minutes 51 seconds  Total Procedure Duration: 0 hours 34 minutes 16 seconds  Findings:      The perianal and digital rectal examinations were normal.      Three mucous-capped and sessile polyps were found in the cecum. The       polyps were 3 to 6 mm in size. These polyps were removed with a cold       snare. Resection and retrieval were complete. Estimated blood loss was       minimal.      Seven sessile polyps were found in the sigmoid colon (4), descending       colon (2), and transverse colon (1). The polyps were  2 to 6 mm in size.       These polyps were removed with a cold snare. Resection and retrieval       were complete. Estimated blood loss was minimal.      A 4 mm polyp was found in the rectum. The polyp was sessile. The polyp       was removed with a cold snare. Resection and retrieval were complete.       There was mild persistent oozing from the polypectomy site. For       hemostasis, one hemostatic clip was successfully placed (MR       conditional). Clip manufacturer: [employer]. There was no       bleeding at the end of the procedure.      A few small-mouthed diverticula were found in the sigmoid colon.      An area of mildly congested mucosa was found in the  cecum. Biopsies were       taken with a cold forceps for histology. Estimated blood loss was       minimal.      Non-bleeding internal hemorrhoids were found during retroflexion. The       hemorrhoids were small and Grade II (internal hemorrhoids that prolapse       but reduce spontaneously).      The terminal ileum appeared normal. Impression:               - Three 3 to 6 mm polyps in the cecum, removed with                            a cold snare. Resected and retrieved.                           - Seven 2 to 6 mm polyps in the sigmoid colon, in                            the descending colon and in the transverse colon,                            removed with a cold snare. Resected and retrieved.                           - One 4 mm polyp in the rectum, removed with a cold                            snare. Resected and retrieved. Clip (MR                            conditional) was placed. Clip manufacturer: [employer].                           - Diverticulosis in the sigmoid colon.                           - Congested mucosa in the cecum.  Biopsied.                           - Non-bleeding internal hemorrhoids.                           - The examined portion of the ileum was normal. Recommendation:           - Return patient to hospital ward for ongoing care.                           - Advance diet as tolerated.                           - If no evidence of bleeding and hemoglobin                            remained stable, should be ok to resume Xarelto                             (rivaroxaban ) at prior dose tomorrow.                           - Await pathology results.                           - If pathology results are otherwise unrevealing                            and continued or recurrent iron  deficiency anemia,                            plan for further small bowel interrogation with                            video capsule endoscopy as  an outpatient.                           - Inpatient GI service will remain available as                            needed. Procedure Code(s):        --- Professional ---                           [phone], Colonoscopy, flexible; with removal of                            tumor(s), polyp(s), or other lesion(s) by snare                            technique                           45380, 59, Colonoscopy, flexible; with biopsy,  single or multiple Diagnosis Code(s):        --- Professional ---                           D12.0, Benign neoplasm of cecum                           D12.5, Benign neoplasm of sigmoid colon                           D12.4, Benign neoplasm of descending colon                           D12.3, Benign neoplasm of transverse colon (hepatic                            flexure or splenic flexure)                           D12.8, Benign neoplasm of rectum                           K64.1, Second degree hemorrhoids                           K63.89, Other specified diseases of intestine                           D50.9, Iron deficiency anemia, unspecified                           K57.30, Diverticulosis of large intestine without                            perforation or abscess without bleeding CPT copyright 2022 American Medical Association. All rights reserved. The codes documented in this report are preliminary and upon coder review may  be revised to meet current compliance requirements. Sandor Flatter, MD 09/08/2024 10:11:39 AM Number of Addenda: 0

## 2024-09-08 NOTE — Anesthesia Postprocedure Evaluation (Signed)
 Anesthesia Post Note  Patient: Ryan [name]  Procedure(s) Performed: COLONOSCOPY EGD (ESOPHAGOGASTRODUODENOSCOPY) POLYPECTOMY, INTESTINE     Patient location during evaluation: PACU Anesthesia Type: MAC Level of consciousness: awake and alert Pain management: pain level controlled Vital Signs Assessment: post-procedure vital signs reviewed and stable Respiratory status: spontaneous breathing, nonlabored ventilation and respiratory function stable Cardiovascular status: blood pressure returned to baseline and stable Postop Assessment: no apparent nausea or vomiting Anesthetic complications: no   No notable events documented.  Last Vitals:  Vitals:   09/08/24 1020 09/08/24 1027  BP:  134/77  Pulse: 83 78  Resp: 19 18  Temp:  36.9 C  SpO2: 94% 96%    Last Pain:  Vitals:   09/08/24 1027  TempSrc: Oral  PainSc:                  Butler Levander Pinal

## 2024-09-08 NOTE — Plan of Care (Signed)

## 2024-09-08 NOTE — Anesthesia Preprocedure Evaluation (Addendum)
 Anesthesia Evaluation  Patient identified by MRN, date of birth, ID band Patient awake    Reviewed: Allergy & Precautions, H&P , NPO status , Patient's Chart, lab work & pertinent test results  History of Anesthesia Complications Negative for: history of anesthetic complications  Airway Mallampati: II  TM Distance: >3 FB Neck ROM: Full    Dental no notable dental hx.    Pulmonary shortness of breath and with exertion, sleep apnea , former smoker   Pulmonary exam normal breath sounds clear to auscultation       Cardiovascular hypertension, Pt. on medications Normal cardiovascular exam+ dysrhythmias Atrial Fibrillation  Rhythm:Regular Rate:Normal     Neuro/Psych   Anxiety     TIA negative psych ROS   GI/Hepatic negative GI ROS, Neg liver ROS,,,  Endo/Other    Class 4 obesity  Renal/GU negative Renal ROS  negative genitourinary   Musculoskeletal  (+) Arthritis ,    Abdominal  (+) + obese  Peds negative pediatric ROS (+)  Hematology  (+) Blood dyscrasia, anemia   Anesthesia Other Findings   Reproductive/Obstetrics negative OB ROS                              Anesthesia Physical Anesthesia Plan  ASA: 4  Anesthesia Plan: MAC   Post-op Pain Management:    Induction: Intravenous  PONV Risk Score and Plan: 1 and Treatment may vary due to age or medical condition and Propofol  infusion  Airway Management Planned: Natural Airway and Simple Face Mask  Additional Equipment: None  Intra-op Plan:   Post-operative Plan:   Informed Consent: I have reviewed the patients History and Physical, chart, labs and discussed the procedure including the risks, benefits and alternatives for the proposed anesthesia with the patient or authorized representative who has indicated his/her understanding and acceptance.     Dental advisory given  Plan Discussed with: CRNA  Anesthesia Plan Comments:           Anesthesia Quick Evaluation

## 2024-09-09 ENCOUNTER — Encounter (HOSPITAL_COMMUNITY): Payer: Self-pay | Admitting: Gastroenterology

## 2024-09-09 ENCOUNTER — Other Ambulatory Visit (HOSPITAL_COMMUNITY): Payer: Self-pay

## 2024-09-09 DIAGNOSIS — Z8673 Personal history of transient ischemic attack (TIA), and cerebral infarction without residual deficits: Secondary | ICD-10-CM

## 2024-09-09 DIAGNOSIS — I4811 Longstanding persistent atrial fibrillation: Secondary | ICD-10-CM | POA: Diagnosis not present

## 2024-09-09 DIAGNOSIS — D649 Anemia, unspecified: Secondary | ICD-10-CM | POA: Diagnosis not present

## 2024-09-09 DIAGNOSIS — Z7901 Long term (current) use of anticoagulants: Secondary | ICD-10-CM | POA: Diagnosis not present

## 2024-09-09 DIAGNOSIS — I5032 Chronic diastolic (congestive) heart failure: Secondary | ICD-10-CM | POA: Diagnosis not present

## 2024-09-09 LAB — CBC
HCT: 26.8 % — ABNORMAL LOW (ref 39.0–52.0)
Hemoglobin: 8.1 g/dL — ABNORMAL LOW (ref 13.0–17.0)
MCH: 21.8 pg — ABNORMAL LOW (ref 26.0–34.0)
MCHC: 30.2 g/dL (ref 30.0–36.0)
MCV: 72.2 fL — ABNORMAL LOW (ref 80.0–100.0)
Platelets: 315 10*3/uL (ref 150–400)
RBC: 3.71 MIL/uL — ABNORMAL LOW (ref 4.22–5.81)
RDW: 19.8 % — ABNORMAL HIGH (ref 11.5–15.5)
WBC: 15.6 10*3/uL — ABNORMAL HIGH (ref 4.0–10.5)
nRBC: 0.4 % — ABNORMAL HIGH (ref 0.0–0.2)

## 2024-09-09 LAB — BASIC METABOLIC PANEL WITH GFR
Anion gap: 15 (ref 5–15)
BUN: 13 mg/dL (ref 8–23)
CO2: 24 mmol/L (ref 22–32)
Calcium: 8.7 mg/dL — ABNORMAL LOW (ref 8.9–10.3)
Chloride: 98 mmol/L (ref 98–111)
Creatinine, Ser: 1.22 mg/dL (ref 0.61–1.24)
GFR, Estimated: >60 mL/min (ref >60)
Glucose, Bld: 111 mg/dL — ABNORMAL HIGH (ref 70–99)
Potassium: 3.1 mmol/L — ABNORMAL LOW (ref 3.5–5.1)
Sodium: 137 mmol/L (ref 135–145)

## 2024-09-09 LAB — MAGNESIUM: Magnesium: 1.9 mg/dL (ref 1.7–2.4)

## 2024-09-09 LAB — SURGICAL PATHOLOGY

## 2024-09-09 MED ORDER — POTASSIUM CHLORIDE 20 MEQ PO PACK
40.0000 meq | PACK | ORAL | Status: AC
Start: 2024-09-09 — End: 2024-09-09
  Administered 2024-09-09 (×2): 40 meq via ORAL
  Filled 2024-09-09 (×2): qty 2

## 2024-09-09 MED ORDER — FUROSEMIDE 20 MG PO TABS
60.0000 mg | ORAL_TABLET | Freq: Every morning | ORAL | 0 refills | Status: AC
  Filled 2024-09-09: qty 180, 30d supply, fill #0

## 2024-09-09 MED ORDER — FUROSEMIDE 20 MG PO TABS
60.0000 mg | ORAL_TABLET | Freq: Every morning | ORAL | 0 refills | Status: DC
  Filled 2024-09-09: qty 90, 30d supply, fill #0

## 2024-09-09 MED ORDER — POTASSIUM CHLORIDE CRYS ER 20 MEQ PO TBCR
20.0000 meq | EXTENDED_RELEASE_TABLET | Freq: Every day | ORAL | 0 refills | Status: DC
  Filled 2024-09-09: qty 7, 7d supply, fill #0

## 2024-09-09 MED ORDER — EMPAGLIFLOZIN 10 MG PO TABS
10.0000 mg | ORAL_TABLET | Freq: Every day | ORAL | 0 refills | Status: AC
  Filled 2024-09-09: qty 30, 30d supply, fill #0

## 2024-09-09 MED ORDER — RIVAROXABAN 20 MG PO TABS: 20.0000 mg | ORAL_TABLET | Freq: Every day | ORAL | Status: DC

## 2024-09-09 MED ORDER — POTASSIUM CHLORIDE CRYS ER 20 MEQ PO TBCR
20.0000 meq | EXTENDED_RELEASE_TABLET | Freq: Every day | ORAL | 0 refills | Status: AC
  Filled 2024-09-09 (×2): qty 7, 7d supply, fill #0

## 2024-09-09 MED ORDER — FERROUS SULFATE 325 (65 FE) MG PO TABS
325.0000 mg | ORAL_TABLET | Freq: Every day | ORAL | 0 refills | Status: AC
  Filled 2024-09-09: qty 90, 90d supply, fill #0

## 2024-09-09 MED ORDER — FUROSEMIDE 20 MG PO TABS
60.0000 mg | ORAL_TABLET | Freq: Every day | ORAL | 0 refills | Status: DC | PRN
  Filled 2024-09-09: qty 90, 30d supply, fill #0

## 2024-09-09 MED ORDER — POTASSIUM CHLORIDE 20 MEQ PO PACK
20.0000 meq | PACK | Freq: Every day | ORAL | 0 refills | Status: DC
  Filled 2024-09-09: qty 7, 7d supply, fill #0

## 2024-09-09 MED ORDER — FUROSEMIDE 10 MG/ML IJ SOLN
120.0000 mg | Freq: Two times a day (BID) | INTRAVENOUS | Status: DC
  Filled 2024-09-09: qty 12

## 2024-09-09 MED ORDER — MAGNESIUM SULFATE 2 GM/50ML IV SOLN
2.0000 g | Freq: Once | INTRAVENOUS | Status: AC
  Administered 2024-09-09: 2 g via INTRAVENOUS
  Filled 2024-09-09: qty 50

## 2024-09-09 NOTE — Progress Notes (Signed)
    Durable Medical Equipment  (From admission, onward)           Start     Ordered   09/09/24 1127  For home use only DME standard manual wheelchair with seat cushion  Once       Comments: Patient suffers from debility and physical deconditioning which impairs their ability to perform daily activities like walking in the home.  A walker will not resolve issue with performing activities of daily living. A wheelchair will allow patient to safely perform daily activities. Patient can safely propel the wheelchair in the home or has a caregiver who can provide assistance. Length of need 6 months. Accessories: elevating leg rests (ELRs), wheel locks, extensions and anti-tippers.   09/09/24 1127   09/09/24 1125  For home use only DME lightweight manual wheelchair with seat cushion  Once       Comments: Patient suffers from Generalized weakness, HTN, TIA , Obesity, dilated aortic root, arthritis, OSA and Paroxysmal Atrial Fibrillation w/ 3 DCCVs  which impairs their ability to perform daily activities like bathing, grooming, and toileting in the home.  A cane or walker will not resolve  issue with performing activities of daily living. A wheelchair will allow patient to safely perform daily activities. Patient is not able to propel themselves in the home using a standard weight wheelchair due to general weakness. Patient can self propel in the lightweight wheelchair. Length of need 12 months . Accessories: elevating leg rests (ELRs), wheel locks, extensions and anti-tippers.   09/09/24 1128

## 2024-09-09 NOTE — Discharge Instructions (Addendum)
 Thank you for allowing us  to be part of your care.   For medication changes we recommend - Lasix  60mg  every morning - Lasix  60mg  again the in the afternoon IF you feel volume overloaded. - Iron  supplement 325mg  every day (don't take with calcium , ideally take with orange juice or something acidic if it doesn't upset your reflux too much: these help maximize the absorption of the iron ).  For the next week after discharge from the hospital, take the potassium supplement (a powder that you drink in water) as the extra Lasix  can bring down your potassium, and ask for your potassium to be checked at your next follow up visit with your PCP.    FOLLOW UP APPOINTMENTS: Make sure to follow up with your Primary care doctor within the next week. We also recommend another follow up in about 2 months to get labs to assess your iron  level.   Please call your PCP or our clinic if you have any questions or concerns, we may be able to help and keep you from a long and expensive emergency room wait. Our clinic and after hours phone number is [phone]. The best time to call is Monday through Friday 9 am to 4 pm but there is always someone available 24/7 if you have an emergency. If you need medication refills please notify your pharmacy one week in advance and they will send us  a request.   We are glad you are feeling better,  Penne Mori; DO - PGY1 Internal Medicine Inpatient Teaching Service at [facility]

## 2024-09-09 NOTE — Plan of Care (Signed)

## 2024-09-09 NOTE — Progress Notes (Signed)
 HD#2 SUBJECTIVE:  Patient Summary: Ryan [name] is a 68 y.o. with a pertinent PMH of  HTN, TIA in 2019, fatty liver disease, obesity, dilated aortic root, arthritis, OSA not on CPAP, Obesity, and paroxysmal atrial fibrillation w/ 3 DCCVs this year, who presented with low Hgb and worsening SOB and admitted for Symptomatic Anemia.   Interim History:  11/21 There were no acute events overnight. Vital signs have been stable. Hgb is 8.1 this morning. K 3.1 and Mag 1.9 (both repleted). GI performed the endoscopy and colonoscopy this morning, which did not find a source of bleeding (see their report for full findings).  On interview, pt reports feeling well. Says he has lost 20lbs in fluid since arrival, and was able voice back EGD and Colonoscopy findings accurately. Has close follow up with his PCP planned and is comfortable to go home. We discussed medication changes of increasing Lasix  frequency and starting Jardiance . All questions have been answered.  Anticipate discharge later today after PM IV Diuresis, pending noon BMP as well.  OBJECTIVE:  Vital Signs: Vitals:   09/08/24 1623 09/08/24 1947 09/09/24 0442 09/09/24 0756  BP: (P) 133/83 132/73 113/64 134/85  Pulse: 90 74 74 81  Resp: 19 18 18 18   Temp: 98.9 F (37.2 C) 98 F (36.7 C) 98.2 F (36.8 C) 97.9 F (36.6 C)  TempSrc: Oral     SpO2: 100% 100% 98% 92%  Weight:   (!) 177.7 kg   Height:       Supplemental O2: Room Air SpO2: 92 %  Filed Weights   09/07/24 1639 09/08/24 0702 09/09/24 0442  Weight: (!) 184.3 kg (!) 184 kg (!) 177.7 kg    Intake/Output Summary (Last 24 hours) at 09/09/2024 0834 Last data filed at 09/09/2024 0819 Gross per 24 hour  Intake 530 ml  Output 2725 ml  Net -2195 ml   Net IO Since Admission: -2,257.33 mL [09/09/24 0834]  Physical Exam: Physical Exam Vitals and nursing note reviewed. Exam conducted with a chaperone present.  Constitutional:      General: He is not in acute distress.     Appearance: He is obese. He is not toxic-appearing.  Cardiovascular:     Rate and Rhythm: Normal rate and regular rhythm.  Pulmonary:     Effort: Pulmonary effort is normal. No respiratory distress.  Abdominal:     General: Bowel sounds are normal. There is no distension.     Palpations: Abdomen is soft.     Tenderness: There is no abdominal tenderness.  Musculoskeletal:     Right lower leg: 1+ Edema present.     Left lower leg: 1+ Edema present.  Neurological:     Mental Status: He is alert.     Patient Lines/Drains/Airways Status     Active Line/Drains/Airways     Name Placement date Placement time Site Days   Peripheral IV 09/06/24 20 G Right Antecubital 09/06/24  1633  Antecubital  1   Airway 07/15/24  0825  -- 54            Pertinent labs and imaging:      Latest Ref Rng & Units 09/09/2024    5:36 AM 09/08/2024    3:21 AM 09/07/2024    1:17 PM  CBC  WBC 4.0 - 10.5 K/uL 15.6  15.9    Hemoglobin 13.0 - 17.0 g/dL 8.1  8.2  8.7   Hematocrit 39.0 - 52.0 % 26.8  27.3  29.6   Platelets 150 -  400 K/uL 315  316         Latest Ref Rng & Units 09/09/2024    5:36 AM 09/08/2024    3:21 AM 09/07/2024    4:15 AM  CMP  Glucose 70 - 99 mg/dL 888  895  881   BUN 8 - 23 mg/dL 13  10  12    Creatinine 0.61 - 1.24 mg/dL 8.77  8.84  9.05   Sodium 135 - 145 mmol/L 137  133  139   Potassium 3.5 - 5.1 mmol/L 3.1  3.1  3.9   Chloride 98 - 111 mmol/L 98  93  102   CO2 22 - 32 mmol/L 24  28  24    Calcium  8.9 - 10.3 mg/dL 8.7  8.7  8.9     No results found.   ASSESSMENT/PLAN:  Assessment: Principal Problem:   Acute anemia Active Problems:   Atrial fibrillation (HCC)   Shortness of breath   (HFpEF) heart failure with preserved ejection fraction (HCC)   Arthritis   Iron  deficiency anemia   Symptomatic anemia   Anemia   Grade II internal hemorrhoids   Polyp of cecum   Adenomatous polyp of transverse colon   Adenomatous polyp of sigmoid colon   Diverticulosis of  colon without hemorrhage   Fundic gland polyps of stomach, benign  Ryan [name] is a 68 y.o. person living with a history of PMH of HTN, TIA in 2019, fatty liver disease, obesity, dilated aortic root, arthritis, OSA not on CPAP, Obesity, and paroxysmal atrial fibrillation w/ 3 DCCVs this year who presented with shortness of breath and anemia and admitted for anemia on hospital day 0  # Anemia # Shortness of Breath Pt presented with low Hgb of 6.7 and was sent in from an outpatient provider for concern of low Hgb as well. Baseline Hgb ~14. Pt denies any recent history of blood loss, but reports likely bloody stools for a few days a couple of weeks ago. Has had worsening SOB over the last few months, markedly worse over the last few weeks. FOBT negative. OSA but does not use a CPAP. VBG nonconcerning. GI EGD and Colonoscopy did not reveal any bleed, but did reveal Barrett's esophagus, and benign gastric polyps/11 colonic polyps, all removed. Recommend its ok to resume [facility] tomorrow if no bleeding overnight and anticipate DC tomorrow. - Transfused 2 unit PRBC - GI workup negative for bleed - IV Iron  500mg  once, given - PO Iron  325mg  daily  # HFpEF Pt is fluid overloaded on exam and has been on Lasix  for managing his total fluid. Has put on ~30lbs in the last year which is suspected currently to be from fluid overload. Fluid overload is chronic, no clear JVD, no orthopnnea, so do not think ECHO is necessary at this time. BNP nonconcerning at 192. - IV Lasix  120mg  BID  - Daily weights - Strict I&O  # Atrial Fibrillation Pt has been controlled on Metoprolol  and Multaq . Recent history of 3 DCCVs this year. Has been on Eliquis  in the past, but this was discontinued for lightheadedness which was improved when he was switched to Xarelto . CHADSVASC: 4 - Metoprolol  Succinate 150mg  daily - Continue home Multaq  400 BID w/ meals PO - Holding home Xarelto  20mg  daily, can resume 11/21 if no bleeding  overnight.  # Elevated TSH TSH 5.35 on 11/19, T4 0.79 - No further concern  # Chronic Health Conditions Pt has the following chronic health conditions which are being managed with  the following home medicaitons: - Tramadol  50mg  Q6H for Arthritic pain - Lipitor 40mg  daily for HLD - Losartan  25mg  daily for HTN - Pantoprazole  40mg  daily for reflux  Best practice: Diet: Normal VTE: SCDs, restart Xarelto  11/21 Code: Full Code  Disposition planning: Prior to Admission Living Arrangement: Home, living with family Anticipated Discharge Location: Home  Best Practice: Diet: NPO VTE: SCDs Start: 09/06/24 1826 Code: Full  Disposition planning: DISPO: Anticipate discharge to home later today after PM IV Diuresis.  GI planning on follow up in 7-10 days and further follow up in 2 months  Signature:  Penne Mori; DO - PGY1 [facility]  8:34 AM, 09/09/2024  On Call pager [phone]

## 2024-09-09 NOTE — TOC Transition Note (Signed)
 Transition of Care [facility]) - Discharge Note   Patient Details  Name: Ryan [name] MRN: [id number] Date of Birth: October 05, 1956  Transition of Care [facility]) CM/SW Contact:  Tom-Johnson, Harvest Muskrat, RN Phone Number: 09/09/2024, 12:48 PM   Clinical Narrative:     Patient is scheduled for discharge today.  Readmission Risk Assessment done. Outpatient f/u, hospital f/u and discharge instructions on AVS. Prescriptions sent to [facility] pharmacy and patient will receive meds prior discharge. Manual Wheelchair ordered from [employer] and will be delivered to patient's home.  Significant other, [name] at bedside and will transport at discharge.  No further ICM needs noted.      Final next level of care: Home/Self Care Barriers to Discharge: Barriers Resolved   Patient Goals and CMS Choice Patient states their goals for this hospitalization and ongoing recovery are:: To return home CMS Medicare.gov Compare Post Acute Care list provided to:: Patient Choice offered to / list presented to : Patient      Discharge Placement                Patient to be transferred to facility by: Significant other Name of family member notified: [name]    Discharge Plan and Services Additional resources added to the After Visit Summary for                  DME Arranged: Wheelchair manual DME Agency: [employer] Date DME Agency Contacted: 09/09/24 Time DME Agency Contacted: 1122 Representative spoke with at DME Agency: [facility] HH Arranged: NA HH Agency: NA        Social Drivers of Health (SDOH) Interventions SDOH Screenings   Food Insecurity: No Food Insecurity (09/07/2024)  Housing: Low Risk  (09/07/2024)  Transportation Needs: No Transportation Needs (09/07/2024)  Utilities: Not At Risk (09/07/2024)  Depression (PHQ2-9): Low Risk  (11/03/2023)  Social Connections: Socially Integrated (09/07/2024)  Tobacco Use: Medium Risk (09/08/2024)     Readmission Risk Interventions    09/09/2024    12:41 PM 09/08/2024    4:27 PM  Readmission Risk Prevention Plan  Post Dischage Appt  Complete  Medication Screening  Complete  Transportation Screening Complete Complete  PCP or Specialist Appt within 5-7 Days Complete   Home Care Screening Complete   Medication Review (RN CM) Referral to Pharmacy

## 2024-09-09 NOTE — Progress Notes (Signed)
    Durable Medical Equipment  (From admission, onward)           Start     Ordered   09/09/24 1127  For home use only DME standard manual wheelchair with seat cushion  Once       Comments: Patient suffers from debility and physical deconditioning which impairs their ability to perform daily activities like walking in the home.  A walker will not resolve issue with performing activities of daily living. A wheelchair will allow patient to safely perform daily activities. Patient can safely propel the wheelchair in the home or has a caregiver who can provide assistance. Length of need 6 months. Accessories: elevating leg rests (ELRs), wheel locks, extensions and anti-tippers.   09/09/24 1127

## 2024-09-09 NOTE — Discharge Summary (Addendum)
 Name: Ryan [name] MRN: [id number] DOB: Mar 15, 1956 68 y.o. PCP: Ryan Kerney SQUIBB, MD  Date of Admission: 09/06/2024 12:53 PM Date of Discharge: 09/09/2024 Attending Physician: Dr. Jone [name]  Discharge Diagnosis: 1. Principal Problem:   Acute anemia Active Problems:   Atrial fibrillation (HCC)   Shortness of breath   (HFpEF) heart failure with preserved ejection fraction (HCC)   Arthritis   Iron  deficiency anemia   Symptomatic anemia   Anemia   Grade II internal hemorrhoids   Polyp of cecum   Adenomatous polyp of transverse colon   Adenomatous polyp of sigmoid colon   Diverticulosis of colon without hemorrhage   Fundic gland polyps of stomach, benign    Discharge Medications: Allergies as of 09/09/2024   No Known Allergies      Medication List     TAKE these medications    albuterol  108 (90 Base) MCG/ACT inhaler Commonly known as: VENTOLIN  HFA Inhale 1 puff into the lungs every 6 (six) hours as needed for shortness of breath.   atorvastatin  40 MG tablet Commonly known as: LIPITOR Take 1 tablet (40 mg total) by mouth daily.   COLLAGEN PO Take 3 capsules by mouth daily. 5000 units total   dronedarone  400 MG tablet Commonly known as: MULTAQ  Take 1 tablet (400 mg total) by mouth 2 (two) times daily with a meal.   empagliflozin  10 MG Tabs tablet Commonly known as: Jardiance  Take 1 tablet (10 mg total) by mouth daily before breakfast.   ferrous sulfate  325 (65 FE) MG tablet Take 1 tablet (325 mg total) by mouth daily with breakfast. Start taking on: September 10, 2024   furosemide  20 MG tablet Commonly known as: LASIX  Take 3 tablets (60 mg total) by mouth in the morning. Take every morning What changed:  medication strength how much to take when to take this additional instructions   furosemide  20 MG tablet Commonly known as: LASIX  Take 3 tablets (60 mg total) by mouth daily as needed for fluid or edema (Take 60mg  in afternoon if fluid  overloaded). What changed:  how much to take reasons to take this   losartan  25 MG tablet Commonly known as: COZAAR  TAKE 1 TABLET DAILY   MAGNESIUM  GLYCINATE PO Take 5,000 mg by mouth daily.   metoprolol  succinate 50 MG 24 hr tablet Commonly known as: TOPROL -XL Take 3 tablets (150 mg total) by mouth daily. Take with or immediately following a meal.   omeprazole  20 MG capsule Commonly known as: PRILOSEC  Take 20 mg by mouth daily.   One-A-Day Mens 50+ Tabs Take 1 tablet by mouth daily. Silver 50+   potassium chloride  20 MEQ packet Commonly known as: KLOR-CON  Take 20 mEq by mouth daily.   rivaroxaban  20 MG Tabs tablet Commonly known as: XARELTO  Take 1 tablet (20 mg total) by mouth daily with supper.   traMADol  50 MG tablet Commonly known as: ULTRAM  Take 50 mg by mouth 2 (two) times daily.   Vitamin D3 125 MCG (5000 UT) Tabs Take 5,000 Units by mouth daily.        Disposition and follow-up:   Ryan [name] was discharged from [facility] in Stable condition.  At the hospital follow up visit please address:  1.  CBC for post-discharge monitoring of anemia w/ profound iron  deficiency. 2.  BMP for checking K post Lasix  increase. Pt was given 7 day supply of KCl. 3.  Recheck Iron  labs in 2 months   Hospital Course by problem  list:  Ryan [name] is a 68 y.o. with a pertinent PMH of  HTN, TIA in 2019, fatty liver disease, obesity, dilated aortic root, arthritis, OSA not on CPAP, Obesity, and paroxysmal atrial fibrillation w/ 3 DCCVs this year, who presented with low Hgb and worsening SOB and admitted for Symptomatic Anemia. Now being discharged on hospital day 2 with the following pertinent hospital course:  Anemia Shortness of Breath Patient has had a history of worsening shortness of breath over the last several months which has gotten markedly worse over the last few weeks. He saw his PCP on 11/18 where CBC showed Hgb in 6 range, after which he  came to the ED for blood transfusion and further evaluation. Iron  studies were profoundly low. He received 2 units of blood which brought his Hgb from 6.7 to 7.5 and GI was consulted for concern for active bleed, and patient received a 500mg  iron  infusion   GI workup GI was consulted for concern of active GI bleed d/t anemia. Significant history for large tubovillous adenoma in 2010 with recommended 3 year colonoscopy follow up which was not done and it was replaced with Cologuard. EGD and Colonoscopy were performed here on 11/20.  -EGD with likely short segment Barrett's esophagus and some benign gastric polyps, but otherwise pretty normal.   -Colonoscopy with 11 polyps, all removed. Otherwise no bleeding or stigmata of recent bleeding noted on either procedure.   Atrial Fibrillation Pt has a history of PAF which has required 3 DCCVs this year and has been controlled on Metoprolol  and Xarelto . Xarelto  was held during the workup for the GI bleed and continued on 11/21. There were no rate or rhythm concerns that arose during the patient's hospital stay.  HFpEF Pt has been on Lasix  40-60mg  daily for management of fluid buildup around his legs. On initial exam, pt had 4+ pitting edema which he said had become chronic. IV Lasix  was started and his home dose was increased.      Subjective 09/09/2024 On interview, pt reports feeling well. Says he has lost 20lbs in fluid since arrival, and was able voice back EGD and Colonoscopy findings accurately. Has close follow up with his PCP planned and is comfortable to go home. We discussed medication changes of increasing Lasix  frequency and starting Jardiance . All questions have been answered.   Discharge Exam:   BP 134/85 (BP Location: Left Arm)   Pulse 81   Temp 97.9 F (36.6 C)   Resp 18   Ht 5' 11 (1.803 m)   Wt (!) 177.7 kg   SpO2 92%   BMI 54.64 kg/m   Physical Exam Vitals and nursing note reviewed. Exam conducted with a chaperone  present.  Constitutional:      General: He is not in acute distress.    Appearance: He is obese. He is not toxic-appearing.  Cardiovascular:     Rate and Rhythm: Normal rate and regular rhythm.  Pulmonary:     Effort: Pulmonary effort is normal. No respiratory distress.  Abdominal:     General: Bowel sounds are normal. There is no distension.     Palpations: Abdomen is soft.     Tenderness: There is no abdominal tenderness.  Musculoskeletal:     Right lower leg: 1+ Edema present.     Left lower leg: 1+ Edema present.  Neurological:     Mental Status: He is alert.   Pertinent Labs, Studies, and Procedures:     Latest Ref Rng & Units  09/09/2024    5:36 AM 09/08/2024    3:21 AM 09/07/2024    1:17 PM  CBC  WBC 4.0 - 10.5 K/uL 15.6  15.9    Hemoglobin 13.0 - 17.0 g/dL 8.1  8.2  8.7   Hematocrit 39.0 - 52.0 % 26.8  27.3  29.6   Platelets 150 - 400 K/uL 315  316         Latest Ref Rng & Units 09/09/2024    5:36 AM 09/08/2024    3:21 AM 09/07/2024    4:15 AM  CMP  Glucose 70 - 99 mg/dL 888  895  881   BUN 8 - 23 mg/dL 13  10  12    Creatinine 0.61 - 1.24 mg/dL 8.77  8.84  9.05   Sodium 135 - 145 mmol/L 137  133  139   Potassium 3.5 - 5.1 mmol/L 3.1  3.1  3.9   Chloride 98 - 111 mmol/L 98  93  102   CO2 22 - 32 mmol/L 24  28  24    Calcium  8.9 - 10.3 mg/dL 8.7  8.7  8.9     DG Chest 2 View Result Date: 09/06/2024 CLINICAL DATA:  Shortness of breath. EXAM: CHEST - 2 VIEW COMPARISON:  Chest radiograph dated 08/03/2024. FINDINGS: Shallow inspiration. No focal consolidation, pleural effusion or pneumothorax. Stable cardiomegaly. No acute osseous pathology. IMPRESSION: 1. No active cardiopulmonary disease. 2. Cardiomegaly. Electronically Signed   By: Vanetta Chou M.D.   On: 09/06/2024 13:54     Discharge Instructions: Discharge Instructions     Call MD for:  difficulty breathing, headache or visual disturbances   Complete by: As directed    Call MD for:  extreme fatigue    Complete by: As directed    Call MD for:  hives   Complete by: As directed    Call MD for:  persistant dizziness or light-headedness   Complete by: As directed    Call MD for:  persistant nausea and vomiting   Complete by: As directed    Call MD for:  redness, tenderness, or signs of infection (pain, swelling, redness, odor or green/yellow discharge around incision site)   Complete by: As directed    Call MD for:  severe uncontrolled pain   Complete by: As directed    Call MD for:  temperature >100.4   Complete by: As directed    Diet - low sodium heart healthy   Complete by: As directed    Increase activity slowly   Complete by: As directed        Signed: Lera Nancyann NOVAK, DO - PGY1 09/09/2024, 10:51 AM

## 2024-09-11 ENCOUNTER — Ambulatory Visit: Payer: Self-pay | Admitting: Gastroenterology

## 2024-09-13 NOTE — Telephone Encounter (Signed)
 Recall placed

## 2024-09-18 ENCOUNTER — Other Ambulatory Visit (HOSPITAL_COMMUNITY): Payer: Self-pay | Admitting: Internal Medicine

## 2024-10-24 ENCOUNTER — Ambulatory Visit

## 2024-10-24 ENCOUNTER — Encounter

## 2024-11-28 ENCOUNTER — Ambulatory Visit (HOSPITAL_COMMUNITY): Admitting: Internal Medicine

## 2025-01-31 ENCOUNTER — Other Ambulatory Visit

## 2025-01-31 ENCOUNTER — Ambulatory Visit: Admitting: Oncology

## 2025-02-07 ENCOUNTER — Ambulatory Visit (HOSPITAL_COMMUNITY): Admitting: Internal Medicine
